# Patient Record
Sex: Female | Born: 1965 | Race: Black or African American | Hispanic: No | Smoking: Former smoker
Health system: Southern US, Community
[De-identification: ages and names within clinical notes are randomized; demographics above are authoritative.]

## PROBLEM LIST (undated history)

## (undated) DIAGNOSIS — G4730 Sleep apnea, unspecified: Secondary | ICD-10-CM

## (undated) DIAGNOSIS — E7800 Pure hypercholesterolemia, unspecified: Secondary | ICD-10-CM

## (undated) DIAGNOSIS — M8580 Other specified disorders of bone density and structure, unspecified site: Secondary | ICD-10-CM

## (undated) DIAGNOSIS — U071 COVID-19: Secondary | ICD-10-CM

## (undated) DIAGNOSIS — I10 Essential (primary) hypertension: Secondary | ICD-10-CM

## (undated) DIAGNOSIS — F419 Anxiety disorder, unspecified: Secondary | ICD-10-CM

## (undated) HISTORY — DX: COVID-19: U07.1

## (undated) HISTORY — DX: Sleep apnea, unspecified: G47.30

## (undated) HISTORY — PX: TONSILLECTOMY: SUR1361

## (undated) HISTORY — DX: Other specified disorders of bone density and structure, unspecified site: M85.80

---

## 1986-12-23 HISTORY — PX: TUBAL LIGATION: SHX77

## 2005-02-12 ENCOUNTER — Ambulatory Visit: Payer: Self-pay | Admitting: Internal Medicine

## 2005-04-02 ENCOUNTER — Ambulatory Visit: Payer: Self-pay | Admitting: Family Medicine

## 2005-04-15 ENCOUNTER — Ambulatory Visit: Payer: Self-pay | Admitting: Family Medicine

## 2006-04-29 ENCOUNTER — Ambulatory Visit: Payer: Self-pay | Admitting: Family Medicine

## 2007-06-23 ENCOUNTER — Ambulatory Visit: Payer: Self-pay | Admitting: Family Medicine

## 2008-06-23 ENCOUNTER — Ambulatory Visit: Payer: Self-pay | Admitting: Family Medicine

## 2008-12-23 HISTORY — PX: ABDOMINAL HYSTERECTOMY: SHX81

## 2009-07-04 ENCOUNTER — Ambulatory Visit: Payer: Self-pay | Admitting: Family Medicine

## 2009-09-28 ENCOUNTER — Ambulatory Visit: Payer: Self-pay | Admitting: Family Medicine

## 2009-10-20 ENCOUNTER — Ambulatory Visit: Payer: Self-pay | Admitting: Obstetrics & Gynecology

## 2009-10-26 ENCOUNTER — Ambulatory Visit: Payer: Self-pay | Admitting: Obstetrics & Gynecology

## 2010-07-18 ENCOUNTER — Ambulatory Visit: Payer: Self-pay | Admitting: Family Medicine

## 2011-01-19 ENCOUNTER — Emergency Department: Payer: Self-pay | Admitting: Emergency Medicine

## 2011-08-06 ENCOUNTER — Ambulatory Visit: Payer: Self-pay | Admitting: Family Medicine

## 2012-08-12 ENCOUNTER — Ambulatory Visit: Payer: Self-pay | Admitting: Family Medicine

## 2013-02-02 ENCOUNTER — Ambulatory Visit: Payer: Self-pay | Admitting: Family Medicine

## 2013-07-29 ENCOUNTER — Encounter: Payer: Self-pay | Admitting: Internal Medicine

## 2013-08-13 ENCOUNTER — Ambulatory Visit: Payer: Self-pay | Admitting: Family Medicine

## 2013-12-23 HISTORY — PX: COLONOSCOPY: SHX174

## 2014-08-23 LAB — HM COLONOSCOPY: HM Colonoscopy: NEGATIVE

## 2014-09-08 ENCOUNTER — Ambulatory Visit: Payer: Self-pay | Admitting: Family Medicine

## 2014-09-09 ENCOUNTER — Ambulatory Visit: Payer: Self-pay | Admitting: Gastroenterology

## 2015-04-26 ENCOUNTER — Encounter: Payer: Self-pay | Admitting: Emergency Medicine

## 2015-04-26 ENCOUNTER — Emergency Department
Admission: EM | Admit: 2015-04-26 | Discharge: 2015-04-27 | Disposition: A | Discharge status: Home / Self Care | Payer: 59 | Attending: Emergency Medicine | Admitting: Emergency Medicine

## 2015-04-26 DIAGNOSIS — N83202 Unspecified ovarian cyst, left side: Secondary | ICD-10-CM

## 2015-04-26 DIAGNOSIS — N8320 Unspecified ovarian cysts: Secondary | ICD-10-CM | POA: Insufficient documentation

## 2015-04-26 DIAGNOSIS — Z87891 Personal history of nicotine dependence: Secondary | ICD-10-CM | POA: Insufficient documentation

## 2015-04-26 DIAGNOSIS — R109 Unspecified abdominal pain: Secondary | ICD-10-CM

## 2015-04-26 HISTORY — DX: Essential (primary) hypertension: I10

## 2015-04-26 HISTORY — DX: Pure hypercholesterolemia, unspecified: E78.00

## 2015-04-26 HISTORY — DX: Anxiety disorder, unspecified: F41.9

## 2015-04-26 LAB — COMPREHENSIVE METABOLIC PANEL
ALBUMIN: 4.3 g/dL (ref 3.5–5.0)
ALK PHOS: 52 U/L (ref 38–126)
ALT: 19 U/L (ref 14–54)
AST: 24 U/L (ref 15–41)
Anion gap: 11 (ref 5–15)
BILIRUBIN TOTAL: 0.7 mg/dL (ref 0.3–1.2)
BUN: 13 mg/dL (ref 6–20)
CO2: 26 mmol/L (ref 22–32)
Calcium: 9.4 mg/dL (ref 8.9–10.3)
Chloride: 101 mmol/L (ref 101–111)
Creatinine, Ser: 0.64 mg/dL (ref 0.44–1.00)
GFR calc Af Amer: >60 mL/min (ref >60)
GFR calc non Af Amer: >60 mL/min (ref >60)
Glucose, Bld: 94 mg/dL (ref 65–99)
Potassium: 3.4 mmol/L — ABNORMAL LOW (ref 3.5–5.1)
SODIUM: 138 mmol/L (ref 135–145)
TOTAL PROTEIN: 8 g/dL (ref 6.5–8.1)

## 2015-04-26 LAB — POCT PREGNANCY, URINE: Preg Test, Ur: NEGATIVE

## 2015-04-26 MED ORDER — MORPHINE SULFATE 4 MG/ML IJ SOLN
4.0000 mg | Freq: Once | INTRAMUSCULAR | Status: AC
  Administered 2015-04-26: 4 mg via INTRAVENOUS

## 2015-04-26 MED ORDER — ONDANSETRON HCL 4 MG/2ML IJ SOLN
4.0000 mg | Freq: Once | INTRAMUSCULAR | Status: AC
  Administered 2015-04-26: 4 mg via INTRAVENOUS

## 2015-04-26 MED ORDER — MORPHINE SULFATE 4 MG/ML IJ SOLN
INTRAMUSCULAR | Status: AC
  Administered 2015-04-26: 4 mg via INTRAVENOUS
  Filled 2015-04-26: qty 1

## 2015-04-26 MED ORDER — SODIUM CHLORIDE 0.9 % IV BOLUS (SEPSIS)
1000.0000 mL | Freq: Once | INTRAVENOUS | Status: AC
  Administered 2015-04-26: 1000 mL via INTRAVENOUS

## 2015-04-26 MED ORDER — IOHEXOL 240 MG/ML SOLN
25.0000 mL | Freq: Once | INTRAMUSCULAR | Status: AC | PRN
  Administered 2015-04-26: 25 mL via ORAL

## 2015-04-26 MED ORDER — ONDANSETRON HCL 4 MG/2ML IJ SOLN
INTRAMUSCULAR | Status: AC
Start: 2015-04-26 — End: 2015-04-26
  Administered 2015-04-26: 4 mg via INTRAVENOUS
  Filled 2015-04-26: qty 2

## 2015-04-26 NOTE — ED Notes (Signed)
PT in with rlq since today, states feel rectal pressure at times.  No dysuria, no vomiting.

## 2015-04-26 NOTE — ED Provider Notes (Signed)
[facility] Emergency Department Provider Note    ____________________________________________  Time seen: 10:00 PM  I have reviewed the triage vital signs and the nursing notes.   HISTORY  Chief Complaint Abdominal Pain       HPI Christine Michael is a 49 y.o. female with a history of hypertension who presents today with right lower abdominal pain that started at 2 PM today. The patient denies any nausea or vomiting. The patient has passed gas today. The patient has not urinated since the pain began so does not know if she is having blood in her urine since the incident started. She denies any vaginal bleeding or discharge, but says that she has had a hysterectomy in the past. She said that she still has her 2 ovaries. Patient does note a history of kidney stones in the family. She states the pain is a 10 out of 10 and sharp at this time. However, throughout the day has been waxing and waning. There is been no radiation of the pain. The patient says she still has her appendix.    Past Medical History  Diagnosis Date  . Hypertension   . Anxiety   . High cholesterol     There are no active problems to display for this patient.   Past Surgical History  Procedure Laterality Date  . Abdominal hysterectomy  2010    partial    Current Outpatient Rx  Name  Route  Sig  Dispense  Refill  . atorvastatin (LIPITOR) 20 MG tablet   Oral   Take 20 mg by mouth daily.         . citalopram (CELEXA) 20 MG tablet   Oral   Take 20 mg by mouth daily.         Marland Kitchen. triamterene-hydrochlorothiazide (MAXZIDE-25) 37.5-25 MG per tablet   Oral   Take 1 tablet by mouth daily.           Allergies Review of patient's allergies indicates no known allergies.  No family history on file.  Social History History  Substance Use Topics  . Smoking status: Former Games developermoker  . Smokeless tobacco: Not on file  . Alcohol Use: No    Review of Systems  Constitutional:  Negative for fever. Eyes: Negative for visual changes. ENT: Negative for sore throat. Cardiovascular: Negative for chest pain. Respiratory: Negative for shortness of breath. Gastrointestinal: Abdominal pain. Negative for vomiting and diarrhea. Genitourinary: Negative for dysuria. Musculoskeletal: Negative for back pain. Skin: Negative for rash. Neurological: Negative for headaches, focal weakness or numbness.   10-point ROS otherwise negative.  ____________________________________________   PHYSICAL EXAM:  VITAL SIGNS: ED Triage Vitals  Enc Vitals Group     BP 04/26/15 2108 140/83 mmHg     Pulse Rate 04/26/15 2108 67     Resp 04/26/15 2154 16     Temp 04/26/15 2108 98.7 F (37.1 C)     Temp Source 04/26/15 2108 Oral     SpO2 04/26/15 2108 100 %     Weight 04/26/15 2108 260 lb (117.935 kg)     Height 04/26/15 2108 5\' 5"  (1.651 m)     Head Cir --      Peak Flow --      Pain Score 04/26/15 2109 10     Pain Loc --      Pain Edu? --      Excl. in GC? --      Constitutional: Alert and oriented. Well appearing and in  no distress. Eyes: Conjunctivae are normal. PERRL. Normal extraocular movements. ENT   Head: Normocephalic and atraumatic.   Nose: No congestion/rhinnorhea.   Mouth/Throat: Mucous membranes are moist.   Neck: No stridor. Hematological/Lymphatic/Immunilogical: No cervical lymphadenopathy. Cardiovascular: Normal rate, regular rhythm. Normal and symmetric distal pulses are present in all extremities. No murmurs, rubs, or gallops. Respiratory: Normal respiratory effort without tachypnea nor retractions. Breath sounds are clear and equal bilaterally. No wheezes/rales/rhonchi. Gastrointestinal: Soft and nontender. No distention. No abdominal bruits. There is no CVA tenderness. Genitourinary: Deferred Musculoskeletal: Nontender with normal range of motion in all extremities. No joint effusions.  No lower extremity tenderness nor edema. Neurologic:   Normal speech and language. No gross focal neurologic deficits are appreciated. Speech is normal. No gait instability. Skin:  Skin is warm, dry and intact. No rash noted. Psychiatric: Mood and affect are normal. Speech and behavior are normal. Patient exhibits appropriate insight and judgment.  ____________________________________________    LABS (pertinent positives/negatives)  Pending CBC. Labs and urine not concerning.  ____________________________________________   EKG    ____________________________________________    RADIOLOGY    ____________________________________________   PROCEDURES    ____________________________________________   INITIAL IMPRESSION / ASSESSMENT AND PLAN / ED COURSE  Pertinent labs & imaging results that were available during my care of the patient were reviewed by me and considered in my medical decision making (see chart for details).  Despite the patient's 10 out of 10 pain the patient has no tenderness on abdominal exam. Because of the right lower quadrant pain the patient will undergo a CAT scan. 2 differential considerations include appendicitis and ureterolithiasis.  ----------------------------------------- 12:23 AM on 04/27/2015 -----------------------------------------  Dr. Scotty CourtStafford to follow-up with CT imaging and disposition accordingly. ____________________________________________   FINAL CLINICAL IMPRESSION(S) / ED DIAGNOSES  Acute abdominal pain to the right lower quadrant. Initial visit.   Arelia Longestavid M Schaevitz, MD 04/27/15 [phone]

## 2015-04-26 NOTE — ED Notes (Signed)
Pt reports RLQ pain since this AM. Pt denies chest pain, denies N/V/D. Pt reports pain as intermittent.

## 2015-04-27 ENCOUNTER — Emergency Department: Payer: 59

## 2015-04-27 LAB — CBC WITH DIFFERENTIAL/PLATELET
BASOS PCT: 1 %
Basophils Absolute: 0 10*3/uL (ref 0–0.1)
Eosinophils Absolute: 0.5 10*3/uL (ref 0–0.7)
Eosinophils Relative: 6 %
HEMATOCRIT: 38.8 % (ref 35.0–47.0)
Hemoglobin: 12.6 g/dL (ref 12.0–16.0)
LYMPHS PCT: 19 %
Lymphs Abs: 1.7 10*3/uL (ref 1.0–3.6)
MCH: 26 pg (ref 26.0–34.0)
MCHC: 32.4 g/dL (ref 32.0–36.0)
MCV: 80.4 fL (ref 80.0–100.0)
Monocytes Absolute: 0.7 10*3/uL (ref 0.2–0.9)
Monocytes Relative: 8 %
Neutro Abs: 5.8 10*3/uL (ref 1.4–6.5)
Neutrophils Relative %: 66 %
Platelets: 188 10*3/uL (ref 150–440)
RBC: 4.83 MIL/uL (ref 3.80–5.20)
RDW: 14.5 % (ref 11.5–14.5)
WBC: 8.8 10*3/uL (ref 3.6–11.0)

## 2015-04-27 LAB — URINALYSIS COMPLETE WITH MICROSCOPIC (ARMC ONLY)
BACTERIA UA: NONE SEEN
BILIRUBIN URINE: NEGATIVE
GLUCOSE, UA: NEGATIVE mg/dL
Hgb urine dipstick: NEGATIVE
KETONES UR: NEGATIVE mg/dL
LEUKOCYTES UA: NEGATIVE
Nitrite: NEGATIVE
PH: 6 (ref 5.0–8.0)
Protein, ur: NEGATIVE mg/dL
Specific Gravity, Urine: 1.011 (ref 1.005–1.030)

## 2015-04-27 MED ORDER — OXYCODONE-ACETAMINOPHEN 5-325 MG PO TABS
ORAL_TABLET | ORAL | Status: AC
  Administered 2015-04-27: 1 via ORAL
  Filled 2015-04-27: qty 1

## 2015-04-27 MED ORDER — OXYCODONE-ACETAMINOPHEN 5-325 MG PO TABS: 1.0000 | ORAL_TABLET | Freq: Four times a day (QID) | ORAL | Status: DC | PRN

## 2015-04-27 MED ORDER — OXYCODONE-ACETAMINOPHEN 5-325 MG PO TABS
1.0000 | ORAL_TABLET | Freq: Once | ORAL | Status: AC
  Administered 2015-04-27: 1 via ORAL

## 2015-04-27 MED ORDER — ONDANSETRON HCL 4 MG PO TABS: 4.0000 mg | ORAL_TABLET | Freq: Four times a day (QID) | ORAL | Status: DC | PRN

## 2015-04-27 MED ORDER — IOHEXOL 300 MG/ML  SOLN
125.0000 mL | Freq: Once | INTRAMUSCULAR | Status: AC | PRN
  Administered 2015-04-27: 125 mL via INTRAVENOUS

## 2015-04-27 MED ORDER — NAPROXEN 500 MG PO TABS: 500.0000 mg | ORAL_TABLET | Freq: Two times a day (BID) | ORAL | Status: DC

## 2015-04-27 NOTE — Discharge Instructions (Signed)
Abdominal Pain, Women °Abdominal (stomach, pelvic, or belly) pain can be caused by many things. It is important to tell your doctor: °· The location of the pain. °· Does it come and go or is it present all the time? °· Are there things that start the pain (eating certain foods, exercise)? °· Are there other symptoms associated with the pain (fever, nausea, vomiting, diarrhea)? °All of this is helpful to know when trying to find the cause of the pain. °CAUSES  °· Stomach: virus or bacteria infection, or ulcer. °· Intestine: appendicitis (inflamed appendix), regional ileitis (Crohn's disease), ulcerative colitis (inflamed colon), irritable bowel syndrome, diverticulitis (inflamed diverticulum of the colon), or cancer of the stomach or intestine. °· Gallbladder disease or stones in the gallbladder. °· Kidney disease, kidney stones, or infection. °· Pancreas infection or cancer. °· Fibromyalgia (pain disorder). °· Diseases of the female organs: °· Uterus: fibroid (non-cancerous) tumors or infection. °· Fallopian tubes: infection or tubal pregnancy. °· Ovary: cysts or tumors. °· Pelvic adhesions (scar tissue). °· Endometriosis (uterus lining tissue growing in the pelvis and on the pelvic organs). °· Pelvic congestion syndrome (female organs filling up with blood just before the menstrual period). °· Pain with the menstrual period. °· Pain with ovulation (producing an egg). °· Pain with an IUD (intrauterine device, birth control) in the uterus. °· Cancer of the female organs. °· Functional pain (pain not caused by a disease, may improve without treatment). °· Psychological pain. °· Depression. °DIAGNOSIS  °Your doctor will decide the seriousness of your pain by doing an examination. °· Blood tests. °· X-rays. °· Ultrasound. °· CT scan (computed tomography, special type of X-ray). °· MRI (magnetic resonance imaging). °· Cultures, for infection. °· Barium enema (dye inserted in the large intestine, to better view it with  X-rays). °· Colonoscopy (looking in intestine with a lighted tube). °· Laparoscopy (minor surgery, looking in abdomen with a lighted tube). °· Major abdominal exploratory surgery (looking in abdomen with a large incision). °TREATMENT  °The treatment will depend on the cause of the pain.  °· Many cases can be observed and treated at home. °· Over-the-counter medicines recommended by your caregiver. °· Prescription medicine. °· Antibiotics, for infection. °· Birth control pills, for painful periods or for ovulation pain. °· Hormone treatment, for endometriosis. °· Nerve blocking injections. °· Physical therapy. °· Antidepressants. °· Counseling with a psychologist or psychiatrist. °· Minor or major surgery. °HOME CARE INSTRUCTIONS  °· Do not take laxatives, unless directed by your caregiver. °· Take over-the-counter pain medicine only if ordered by your caregiver. Do not take aspirin because it can cause an upset stomach or bleeding. °· Try a clear liquid diet (broth or water) as ordered by your caregiver. Slowly move to a bland diet, as tolerated, if the pain is related to the stomach or intestine. °· Have a thermometer and take your temperature several times a day, and record it. °· Bed rest and sleep, if it helps the pain. °· Avoid sexual intercourse, if it causes pain. °· Avoid stressful situations. °· Keep your follow-up appointments and tests, as your caregiver orders. °· If the pain does not go away with medicine or surgery, you may try: °· Acupuncture. °· Relaxation exercises (yoga, meditation). °· Group therapy. °· Counseling. °SEEK MEDICAL CARE IF:  °· You notice certain foods cause stomach pain. °· Your home care treatment is not helping your pain. °· You need stronger pain medicine. °· You want your IUD removed. °· You feel faint or   not go away with medicine or surgery, you may try:   Acupuncture.   Relaxation exercises (yoga, meditation).   Group therapy.   Counseling.  SEEK MEDICAL CARE IF:    You notice certain foods cause stomach pain.   Your home care treatment is not helping your pain.   You need stronger pain medicine.   You want your IUD removed.   You feel faint or lightheaded.   You develop nausea and vomiting.   You develop a rash.   You are having side effects or an allergy to your medicine.  SEEK IMMEDIATE MEDICAL CARE IF:    Your  pain does not go away or gets worse.   You have a fever.   Your pain is felt only in portions of the abdomen. The right side could possibly be appendicitis. The left lower portion of the abdomen could be colitis or diverticulitis.   You are passing blood in your stools (bright red or black tarry stools, with or without vomiting).   You have blood in your urine.   You develop chills, with or without a fever.   You pass out.  MAKE SURE YOU:    Understand these instructions.   Will watch your condition.   Will get help right away if you are not doing well or get worse.  Document Released: 10/06/2007 Document Revised: 04/25/2014 Document Reviewed: 10/26/2009  Exit[employer] Patient Information 2015 [employer]. This information is not intended to replace advice given to you by your health care provider. Make sure you discuss any questions you have with your health care provider.          Ovarian Cyst  An ovarian cyst is a fluid-filled sac that forms on an ovary. The ovaries are small organs that produce eggs in women. Various types of cysts can form on the ovaries. Most are not cancerous. Many do not cause problems, and they often go away on their own. Some may cause symptoms and require treatment. Common types of ovarian cysts include:   Functional cysts--These cysts may occur every month during the menstrual cycle. This is normal. The cysts usually go away with the next menstrual cycle if the woman does not get pregnant. Usually, there are no symptoms with a functional cyst.   Endometrioma cysts--These cysts form from the tissue that lines the uterus. They are also called "chocolate cysts" because they become filled with blood that turns brown. This type of cyst can cause pain in the lower abdomen during intercourse and with your menstrual period.   Cystadenoma cysts--This type develops from the cells on the outside of the ovary. These cysts can get very big and cause lower abdomen pain and pain with  intercourse. This type of cyst can twist on itself, cut off its blood supply, and cause severe pain. It can also easily rupture and cause a lot of pain.   Dermoid cysts--This type of cyst is sometimes found in both ovaries. These cysts may contain different kinds of body tissue, such as skin, teeth, hair, or cartilage. They usually do not cause symptoms unless they get very big.   Theca lutein cysts--These cysts occur when too much of a certain hormone (human chorionic gonadotropin) is produced and overstimulates the ovaries to produce an egg. This is most common after procedures used to assist with the conception of a baby (in vitro fertilization).  CAUSES    Fertility drugs can cause a condition in which multiple large cysts are formed on the   more about the cyst. These tests may include:  Ultrasound.  X-ray of the pelvis.  CT scan.  MRI.  Blood tests. TREATMENT  Many ovarian cysts go away on their own without treatment. Your health care provider may want to check your cyst regularly for 2-3 months to see if it changes. For women in menopause, it is particularly important to monitor a cyst closely because of the higher rate of ovarian cancer in menopausal women. When treatment is needed, it may include any of the following:  A procedure to  drain the cyst (aspiration). This may be done using a long needle and ultrasound. It can also be done through a laparoscopic procedure. This involves using a thin, lighted tube with a tiny camera on the end (laparoscope) inserted through a small incision.  Surgery to remove the whole cyst. This may be done using laparoscopic surgery or an open surgery involving a larger incision in the lower abdomen.  Hormone treatment or birth control pills. These methods are sometimes used to help dissolve a cyst. HOME CARE INSTRUCTIONS   Only take over-the-counter or prescription medicines as directed by your health care provider.  Follow up with your health care provider as directed.  Get regular pelvic exams and Pap tests. SEEK MEDICAL CARE IF:   Your periods are late, irregular, or painful, or they stop.  Your pelvic pain or abdominal pain does not go away.  Your abdomen becomes larger or swollen.  You have pressure on your bladder or trouble emptying your bladder completely.  You have pain during sexual intercourse.  You have feelings of fullness, pressure, or discomfort in your stomach.  You lose weight for no apparent reason.  You feel generally ill.  You become constipated.  You lose your appetite.  You develop acne.  You have an increase in body and facial hair.  You are gaining weight, without changing your exercise and eating habits.  You think you are pregnant. SEEK IMMEDIATE MEDICAL CARE IF:   You have increasing abdominal pain.  You feel sick to your stomach (nauseous), and you throw up (vomit).  You develop a fever that comes on suddenly.  You have abdominal pain during a bowel movement.  Your menstrual periods become heavier than usual. MAKE SURE YOU:  Understand these instructions.  Will watch your condition.  Will get help right away if you are not doing well or get worse. Document Released: 12/09/2005 Document Revised: 12/14/2013 Document Reviewed:  08/16/2013 [facility] Patient Information 2015 [place], [place]. This information is not intended to replace advice given to you by your health care provider. Make sure you discuss any questions you have with your health care provider.    You were prescribed medications that are potentially sedating.  Please avoid operating heavy or dangerous machinery, including driving a car, while taking this medication.

## 2015-04-27 NOTE — ED Provider Notes (Signed)
-----------------------------------------   1:34 AM on 04/27/2015 -----------------------------------------  I assumed care of the patient at 11 PM with the CT scan of the abdomen and pelvis and some labs pending. The remainder of the workup has been completed, and the CT scan reveals a left ovarian cyst, which is likely hemorrhagic. Symptoms are controlled at this time and the patient will be discharged with prescriptions for Naprosyn, Percocet, and Zofran. She is medically stable. She is to follow-up with her doctor this week.  Results for orders placed or performed during the hospital encounter of 04/26/15  Comprehensive metabolic panel  Result Value Ref Range   Sodium 138 135 - 145 mmol/L   Potassium 3.4 (L) 3.5 - 5.1 mmol/L   Chloride 101 101 - 111 mmol/L   CO2 26 22 - 32 mmol/L   Glucose, Bld 94 65 - 99 mg/dL   BUN 13 6 - 20 mg/dL   Creatinine, Ser 1.020.64 0.44 - 1.00 mg/dL   Calcium 9.4 8.9 - 72.510.3 mg/dL   Total Protein 8.0 6.5 - 8.1 g/dL   Albumin 4.3 3.5 - 5.0 g/dL   AST 24 15 - 41 U/L   ALT 19 14 - 54 U/L   Alkaline Phosphatase 52 38 - 126 U/L   Total Bilirubin 0.7 0.3 - 1.2 mg/dL   GFR calc non Af Amer >60 >60 mL/min   GFR calc Af Amer >60 >60 mL/min   Anion gap 11 5 - 15  Urinalysis complete, with microscopic [facility])  Result Value Ref Range   Color, Urine YELLOW (A) YELLOW   APPearance CLEAR (A) CLEAR   Glucose, UA NEGATIVE NEGATIVE mg/dL   Bilirubin Urine NEGATIVE NEGATIVE   Ketones, ur NEGATIVE NEGATIVE mg/dL   Specific Gravity, Urine 1.011 1.005 - 1.030   Hgb urine dipstick NEGATIVE NEGATIVE   pH 6.0 5.0 - 8.0   Protein, ur NEGATIVE NEGATIVE mg/dL   Nitrite NEGATIVE NEGATIVE   Leukocytes, UA NEGATIVE NEGATIVE   RBC / HPF 0-5 0 - 5 RBC/hpf   WBC, UA 0-5 0 - 5 WBC/hpf   Bacteria, UA NONE SEEN NONE SEEN   Squamous Epithelial / LPF 0-5 (A) NONE SEEN   Mucous PRESENT   CBC with Differential  Result Value Ref Range   WBC 8.8 3.6 - 11.0 K/uL   RBC 4.83 3.80 - 5.20  MIL/uL   Hemoglobin 12.6 12.0 - 16.0 g/dL   HCT 36.638.8 44.035.0 - 34.747.0 %   MCV 80.4 80.0 - 100.0 fL   MCH 26.0 26.0 - 34.0 pg   MCHC 32.4 32.0 - 36.0 g/dL   RDW 42.514.5 95.611.5 - 38.714.5 %   Platelets 188 150 - 440 K/uL   Neutrophils Relative % 66 %   Neutro Abs 5.8 1.4 - 6.5 K/uL   Lymphocytes Relative 19 %   Lymphs Abs 1.7 1.0 - 3.6 K/uL   Monocytes Relative 8 %   Monocytes Absolute 0.7 0.2 - 0.9 K/uL   Eosinophils Relative 6 %   Eosinophils Absolute 0.5 0 - 0.7 K/uL   Basophils Relative 1 %   Basophils Absolute 0.0 0 - 0.1 K/uL  Pregnancy, urine POC  Result Value Ref Range   Preg Test, Ur NEGATIVE NEGATIVE     Sharman CheekPhillip Sorina Derrig, MD 04/27/15 [phone]

## 2015-04-29 ENCOUNTER — Emergency Department
Admission: EM | Admit: 2015-04-29 | Discharge: 2015-04-29 | Discharge status: Against Medical Advice | Payer: 59 | Attending: Emergency Medicine | Admitting: Emergency Medicine

## 2015-04-29 ENCOUNTER — Encounter: Payer: Self-pay | Admitting: Emergency Medicine

## 2015-04-29 DIAGNOSIS — R1031 Right lower quadrant pain: Secondary | ICD-10-CM | POA: Diagnosis not present

## 2015-04-29 DIAGNOSIS — I10 Essential (primary) hypertension: Secondary | ICD-10-CM | POA: Diagnosis not present

## 2015-04-29 DIAGNOSIS — N8320 Unspecified ovarian cysts: Secondary | ICD-10-CM | POA: Diagnosis not present

## 2015-04-29 DIAGNOSIS — K5900 Constipation, unspecified: Secondary | ICD-10-CM | POA: Diagnosis not present

## 2015-04-29 LAB — URINALYSIS COMPLETE WITH MICROSCOPIC (ARMC ONLY)
BACTERIA UA: NONE SEEN
BILIRUBIN URINE: NEGATIVE
GLUCOSE, UA: NEGATIVE mg/dL
Hgb urine dipstick: NEGATIVE
Ketones, ur: NEGATIVE mg/dL
LEUKOCYTES UA: NEGATIVE
Nitrite: NEGATIVE
Protein, ur: NEGATIVE mg/dL
SPECIFIC GRAVITY, URINE: 1.016 (ref 1.005–1.030)
pH: 7 (ref 5.0–8.0)

## 2015-04-29 LAB — CBC WITH DIFFERENTIAL/PLATELET
BASOS PCT: 1 %
Basophils Absolute: 0 10*3/uL (ref 0–0.1)
Eosinophils Absolute: 0.5 10*3/uL (ref 0–0.7)
Eosinophils Relative: 7 %
HCT: 38.6 % (ref 35.0–47.0)
Hemoglobin: 12.7 g/dL (ref 12.0–16.0)
Lymphocytes Relative: 28 %
Lymphs Abs: 1.8 10*3/uL (ref 1.0–3.6)
MCH: 26.2 pg (ref 26.0–34.0)
MCHC: 32.8 g/dL (ref 32.0–36.0)
MCV: 80 fL (ref 80.0–100.0)
MONO ABS: 0.5 10*3/uL (ref 0.2–0.9)
Monocytes Relative: 8 %
Neutro Abs: 3.5 10*3/uL (ref 1.4–6.5)
Neutrophils Relative %: 56 %
Platelets: 197 10*3/uL (ref 150–440)
RBC: 4.83 MIL/uL (ref 3.80–5.20)
RDW: 14.3 % (ref 11.5–14.5)
WBC: 6.3 10*3/uL (ref 3.6–11.0)

## 2015-04-29 LAB — BASIC METABOLIC PANEL
Anion gap: 7 (ref 5–15)
BUN: 10 mg/dL (ref 6–20)
CO2: 28 mmol/L (ref 22–32)
Calcium: 9 mg/dL (ref 8.9–10.3)
Chloride: 102 mmol/L (ref 101–111)
Creatinine, Ser: 0.67 mg/dL (ref 0.44–1.00)
Glucose, Bld: 93 mg/dL (ref 65–99)
Potassium: 3.5 mmol/L (ref 3.5–5.1)
Sodium: 137 mmol/L (ref 135–145)

## 2015-04-29 NOTE — ED Notes (Signed)
Patient presents to the ED with right lower abdominal/pelvic pain intermittent since Wednesday.  Patient states she was seen in ED on Wednesday and was told she had a cyst on her ovary.  Patient reports having constipation and bloating since Wednesday and no BM since Wednesday.  Patient reports pain subsided but returned this morning.  Patient states pain is very sharp.  Patient denies vomiting.  Patient denies chest pain and shortness of breath.

## 2015-05-21 ENCOUNTER — Other Ambulatory Visit: Payer: Self-pay

## 2015-05-21 ENCOUNTER — Emergency Department
Admission: EM | Admit: 2015-05-21 | Discharge: 2015-05-22 | Disposition: A | Discharge status: Home / Self Care | Payer: 59 | Attending: Emergency Medicine | Admitting: Emergency Medicine

## 2015-05-21 DIAGNOSIS — Z87891 Personal history of nicotine dependence: Secondary | ICD-10-CM | POA: Insufficient documentation

## 2015-05-21 DIAGNOSIS — N83202 Unspecified ovarian cyst, left side: Secondary | ICD-10-CM

## 2015-05-21 DIAGNOSIS — K529 Noninfective gastroenteritis and colitis, unspecified: Secondary | ICD-10-CM | POA: Insufficient documentation

## 2015-05-21 DIAGNOSIS — N8320 Unspecified ovarian cysts: Secondary | ICD-10-CM | POA: Insufficient documentation

## 2015-05-21 DIAGNOSIS — R109 Unspecified abdominal pain: Secondary | ICD-10-CM | POA: Diagnosis present

## 2015-05-21 DIAGNOSIS — Z79899 Other long term (current) drug therapy: Secondary | ICD-10-CM | POA: Diagnosis not present

## 2015-05-21 DIAGNOSIS — R1032 Left lower quadrant pain: Secondary | ICD-10-CM

## 2015-05-21 DIAGNOSIS — I10 Essential (primary) hypertension: Secondary | ICD-10-CM | POA: Diagnosis not present

## 2015-05-21 DIAGNOSIS — R079 Chest pain, unspecified: Secondary | ICD-10-CM | POA: Insufficient documentation

## 2015-05-21 LAB — COMPREHENSIVE METABOLIC PANEL
ALBUMIN: 3.9 g/dL (ref 3.5–5.0)
ALK PHOS: 50 U/L (ref 38–126)
ALT: 16 U/L (ref 14–54)
AST: 21 U/L (ref 15–41)
Anion gap: 11 (ref 5–15)
BUN: 18 mg/dL (ref 6–20)
CO2: 25 mmol/L (ref 22–32)
Calcium: 9.2 mg/dL (ref 8.9–10.3)
Chloride: 103 mmol/L (ref 101–111)
Creatinine, Ser: 0.78 mg/dL (ref 0.44–1.00)
Glucose, Bld: 166 mg/dL — ABNORMAL HIGH (ref 65–99)
POTASSIUM: 3.1 mmol/L — AB (ref 3.5–5.1)
SODIUM: 139 mmol/L (ref 135–145)
Total Bilirubin: 0.4 mg/dL (ref 0.3–1.2)
Total Protein: 7.5 g/dL (ref 6.5–8.1)

## 2015-05-21 LAB — CBC WITH DIFFERENTIAL/PLATELET
BASOS PCT: 1 %
Basophils Absolute: 0.1 10*3/uL (ref 0–0.1)
Eosinophils Absolute: 0.5 10*3/uL (ref 0–0.7)
Eosinophils Relative: 2 %
HCT: 37.4 % (ref 35.0–47.0)
Hemoglobin: 12.1 g/dL (ref 12.0–16.0)
LYMPHS PCT: 12 %
Lymphs Abs: 2.4 10*3/uL (ref 1.0–3.6)
MCH: 25.7 pg — ABNORMAL LOW (ref 26.0–34.0)
MCHC: 32.4 g/dL (ref 32.0–36.0)
MCV: 79.5 fL — ABNORMAL LOW (ref 80.0–100.0)
Monocytes Absolute: 1.1 10*3/uL — ABNORMAL HIGH (ref 0.2–0.9)
Monocytes Relative: 5 %
Neutro Abs: 16.2 10*3/uL — ABNORMAL HIGH (ref 1.4–6.5)
Neutrophils Relative %: 80 %
PLATELETS: 209 10*3/uL (ref 150–440)
RBC: 4.71 MIL/uL (ref 3.80–5.20)
RDW: 14.3 % (ref 11.5–14.5)
WBC: 20.3 10*3/uL — ABNORMAL HIGH (ref 3.6–11.0)

## 2015-05-21 LAB — LIPASE, BLOOD: Lipase: 32 U/L (ref 22–51)

## 2015-05-21 MED ORDER — ONDANSETRON 8 MG PO TBDP
ORAL_TABLET | ORAL | Status: AC
  Administered 2015-05-21: 8 mg via ORAL
  Filled 2015-05-21: qty 1

## 2015-05-21 MED ORDER — ONDANSETRON 8 MG PO TBDP
8.0000 mg | ORAL_TABLET | Freq: Once | ORAL | Status: AC
  Administered 2015-05-21: 8 mg via ORAL

## 2015-05-21 MED ORDER — MORPHINE SULFATE 4 MG/ML IJ SOLN
4.0000 mg | Freq: Once | INTRAMUSCULAR | Status: AC
  Administered 2015-05-22: 4 mg via INTRAVENOUS

## 2015-05-21 MED ORDER — SODIUM CHLORIDE 0.9 % IV BOLUS (SEPSIS)
500.0000 mL | Freq: Once | INTRAVENOUS | Status: AC
  Administered 2015-05-22: 500 mL via INTRAVENOUS

## 2015-05-21 MED ORDER — ONDANSETRON HCL 4 MG/2ML IJ SOLN
4.0000 mg | Freq: Once | INTRAMUSCULAR | Status: AC
  Administered 2015-05-22: 4 mg via INTRAVENOUS

## 2015-05-21 NOTE — ED Notes (Signed)
Pt presents to ER alert and in NAD. Pt states stomach pain since 4pm. Denies n/v. Reports she has hx of ovarian cysts. Pt also c/o chest pain.

## 2015-05-21 NOTE — ED Provider Notes (Signed)
[facility] Emergency Department Provider Note  ____________________________________________  Time seen: Approximately 11:56 PM  I have reviewed the triage vital signs and the nursing notes.   HISTORY  Chief Complaint Abdominal Pain and Chest Pain    HPI Christine Michael is a 49 y.o. female who presents with abdominal pain since 4 PM. Patient describes 10/10 gradual onset, nonradiating pain mainly in left lower quadrant and also peri-umbilical. Patient reports eating steak at approximately 3 PM and felt fine until her pain began. Endorses nausea, denies vomiting. Also states her chest was hurting when her pain was at the maximum. Denies diarrhea; had a normal bowel movement approximately 9 PM. States she felt pressure in her rectum. Denies recent foreign travel or antibiotics. Denies fever, chills, shortness of breath, headache, weakness, numbness, tingling.   Past Medical History  Diagnosis Date  . Hypertension   . Anxiety   . High cholesterol    Ovarian cyst  There are no active problems to display for this patient.   Past Surgical History  Procedure Laterality Date  . Abdominal hysterectomy  2010    partial    Current Outpatient Rx  Name  Route  Sig  Dispense  Refill  . atorvastatin (LIPITOR) 20 MG tablet   Oral   Take 20 mg by mouth daily.         . ciprofloxacin (CIPRO) 500 MG tablet   Oral   Take 1 tablet (500 mg total) by mouth 2 (two) times daily.   14 tablet   0   . citalopram (CELEXA) 20 MG tablet   Oral   Take 20 mg by mouth daily.         . metroNIDAZOLE (FLAGYL) 500 MG tablet   Oral   Take 1 tablet (500 mg total) by mouth 2 (two) times daily.   14 tablet   0   . naproxen (NAPROSYN) 500 MG tablet   Oral   Take 1 tablet (500 mg total) by mouth 2 (two) times daily with a meal.   20 tablet   0   . ondansetron (ZOFRAN) 4 MG tablet   Oral   Take 1 tablet (4 mg total) by mouth every 6 (six) hours as needed for nausea or  vomiting.   20 tablet   1   . ondansetron (ZOFRAN) 4 MG tablet   Oral   Take 1 tablet (4 mg total) by mouth every 8 (eight) hours as needed for nausea or vomiting.   20 tablet   1   . oxyCODONE-acetaminophen (ROXICET) 5-325 MG per tablet   Oral   Take 1 tablet by mouth every 6 (six) hours as needed for severe pain.   12 tablet   0   . oxyCODONE-acetaminophen (ROXICET) 5-325 MG per tablet   Oral   Take 1 tablet by mouth every 4 (four) hours as needed for severe pain.   20 tablet   0   . triamterene-hydrochlorothiazide (MAXZIDE-25) 37.5-25 MG per tablet   Oral   Take 1 tablet by mouth daily.           Allergies Review of patient's allergies indicates no known allergies.  Family History  Problem Relation Age of Onset  . Cancer Mother   . Alcohol abuse Father     Social History History  Substance Use Topics  . Smoking status: Former Games developermoker  . Smokeless tobacco: Not on file  . Alcohol Use: No    Review of Systems Constitutional: No  fever/chills Eyes: No visual changes. ENT: No sore throat. Cardiovascular: Positive for chest pain. Respiratory: Denies shortness of breath. Gastrointestinal: Positive for abdominal pain. Positive for nausea. Negative for vomiting.  No diarrhea.  No constipation. Genitourinary: Negative for dysuria. Musculoskeletal: Negative for back pain. Skin: Negative for rash. Neurological: Negative for headaches, focal weakness or numbness.  10-point ROS otherwise negative.  ____________________________________________   PHYSICAL EXAM:  VITAL SIGNS: ED Triage Vitals  Enc Vitals Group     BP 05/21/15 2142 110/62 mmHg     Pulse Rate 05/21/15 2142 87     Resp 05/21/15 2142 24     Temp 05/21/15 2142 98.2 F (36.8 C)     Temp Source 05/21/15 2142 Oral     SpO2 05/21/15 2142 99 %     Weight 05/21/15 2142 274 lb (124.286 kg)     Height 05/21/15 2142  (1.676 m)     Head Cir --      Peak Flow --      Pain Score 05/21/15 2143 10      Pain Loc --      Pain Edu? --      Excl. in GC? --     Constitutional: Alert and oriented. Well appearing and in moderate acute distress. Eyes: Conjunctivae are normal. PERRL. EOMI. Head: Atraumatic. Nose: No congestion/rhinnorhea. Mouth/Throat: Mucous membranes are moist.  Oropharynx non-erythematous. Neck: No stridor.   Cardiovascular: Normal rate, regular rhythm. Grossly normal heart sounds.  Good peripheral circulation. Respiratory: Normal respiratory effort.  No retractions. Lungs CTAB. Gastrointestinal: Obese, soft, tender to palpation left upper and left lower quadrant without rebound or guarding. Pain maximally and left lower quadrant. No distention. No abdominal bruits. No CVA tenderness. Rectal: No hemorrhoid or prolapse noted. Musculoskeletal: No lower extremity tenderness nor edema.  No joint effusions. Neurologic:  Normal speech and language. No gross focal neurologic deficits are appreciated. Speech is normal. No gait instability. Skin:  Skin is warm, dry and intact. No rash noted. Psychiatric: Mood and affect are normal. Speech and behavior are normal.  ____________________________________________   LABS (all labs ordered are listed, but only abnormal results are displayed)  Labs Reviewed  CBC WITH DIFFERENTIAL/PLATELET - Abnormal; Notable for the following:    WBC 20.3 (*)    MCV 79.5 (*)    MCH 25.7 (*)    Neutro Abs 16.2 (*)    Monocytes Absolute 1.1 (*)    All other components within normal limits  COMPREHENSIVE METABOLIC PANEL - Abnormal; Notable for the following:    Potassium 3.1 (*)    Glucose, Bld 166 (*)    All other components within normal limits  LIPASE, BLOOD   ____________________________________________  EKG  ED ECG REPORT I, Elgin Carn J, the attending physician, personally viewed and interpreted this ECG.   Date: 05/22/2015  EKG Time: 2154  Rate: 82  Rhythm: normal EKG, normal sinus rhythm  Axis: Normal  Intervals:none  ST&T  Change: Nonspecific  ____________________________________________  RADIOLOGY  CT abdomen/pelvis interpreted per Dr. Cherly Hensen: 1. 7.7 x 6.5 cm cystic mass at the left hemipelvis. This has increased in size from the recent prior CT, with layering increased attenuation. This most likely reflects an enlarging hemorrhagic cyst, though a cystic ovarian tumor could have a similar appearance. Depending on the patient's symptoms, MRI could be considered for further evaluation. 2. Vague wall thickening and soft tissue inflammation involving multiple loops of small bowel in the lower abdomen and upper pelvis. This is concerning for an  acute infectious or inflammatory ileitis.  ____________________________________________   PROCEDURES  Procedure(s) performed: None  Critical Care performed: No  ____________________________________________   INITIAL IMPRESSION / ASSESSMENT AND PLAN / ED COURSE  Pertinent labs & imaging results that were available during my care of the patient were reviewed by me and considered in my medical decision making (see chart for details).  49 year old female presenting with left-sided abdominal pain and leukocytosis; exam worrisome for acute diverticulitis. Patient states this pain is completely different and on the opposite side from when she presented earlier this month and had a CT scan. Discussed with patient and her family; will initiate IV analgesia and repeat CT scan to evaluate etiology of abdominal pain and leukocytosis.  ----------------------------------------- 2:07 AM on 05/22/2015 -----------------------------------------  Patient improved; rates 4/10 pain currently. Discussed with patient and mother results of CT scan. Patient has upcoming gynecology appointment with [facility]. Will start patient on Cipro and Flagyl; prescriptions for analgesia and antiemetic. Strict return precautions given. Both verbalize understanding and agree with plan of  care. ____________________________________________   FINAL CLINICAL IMPRESSION(S) / ED DIAGNOSES  Final diagnoses:  Cyst of left ovary  Left lower quadrant pain  Ileitis      Irean Hong, MD 05/22/15 [id number]

## 2015-05-22 ENCOUNTER — Encounter: Payer: Self-pay | Admitting: Radiology

## 2015-05-22 ENCOUNTER — Emergency Department: Payer: 59

## 2015-05-22 MED ORDER — ONDANSETRON HCL 4 MG PO TABS
ORAL_TABLET | ORAL | Status: AC
  Filled 2015-05-22: qty 1

## 2015-05-22 MED ORDER — METRONIDAZOLE 500 MG PO TABS: 500.0000 mg | ORAL_TABLET | Freq: Two times a day (BID) | ORAL | Status: DC

## 2015-05-22 MED ORDER — CIPROFLOXACIN HCL 500 MG PO TABS
500.0000 mg | ORAL_TABLET | Freq: Once | ORAL | Status: AC
Start: 2015-05-22 — End: 2015-05-22
  Administered 2015-05-22: 500 mg via ORAL

## 2015-05-22 MED ORDER — ONDANSETRON HCL 4 MG/2ML IJ SOLN
INTRAMUSCULAR | Status: AC
  Filled 2015-05-22: qty 2

## 2015-05-22 MED ORDER — CIPROFLOXACIN HCL 500 MG PO TABS
ORAL_TABLET | ORAL | Status: AC
  Administered 2015-05-22: 500 mg via ORAL
  Filled 2015-05-22: qty 1

## 2015-05-22 MED ORDER — OXYCODONE-ACETAMINOPHEN 5-325 MG PO TABS
1.0000 | ORAL_TABLET | Freq: Once | ORAL | Status: AC
  Administered 2015-05-22: 1 via ORAL

## 2015-05-22 MED ORDER — METRONIDAZOLE 500 MG PO TABS
ORAL_TABLET | ORAL | Status: AC
  Administered 2015-05-22: 500 mg via ORAL
  Filled 2015-05-22: qty 1

## 2015-05-22 MED ORDER — METRONIDAZOLE 500 MG PO TABS
500.0000 mg | ORAL_TABLET | Freq: Once | ORAL | Status: AC
  Administered 2015-05-22: 500 mg via ORAL

## 2015-05-22 MED ORDER — OXYCODONE-ACETAMINOPHEN 5-325 MG PO TABS
ORAL_TABLET | ORAL | Status: AC
  Administered 2015-05-22: 1 via ORAL
  Filled 2015-05-22: qty 1

## 2015-05-22 MED ORDER — ONDANSETRON 4 MG PO TBDP
ORAL_TABLET | ORAL | Status: AC
  Administered 2015-05-22: 4 mg via ORAL
  Filled 2015-05-22: qty 1

## 2015-05-22 MED ORDER — CIPROFLOXACIN HCL 500 MG PO TABS: 500.0000 mg | ORAL_TABLET | Freq: Two times a day (BID) | ORAL | Status: DC

## 2015-05-22 MED ORDER — MORPHINE SULFATE 4 MG/ML IJ SOLN
4.0000 mg | Freq: Once | INTRAMUSCULAR | Status: AC
  Administered 2015-05-22: 4 mg via INTRAVENOUS

## 2015-05-22 MED ORDER — OXYCODONE-ACETAMINOPHEN 5-325 MG PO TABS: 1.0000 | ORAL_TABLET | ORAL | Status: DC | PRN

## 2015-05-22 MED ORDER — IOHEXOL 300 MG/ML  SOLN
125.0000 mL | Freq: Once | INTRAMUSCULAR | Status: AC | PRN
  Administered 2015-05-22: 125 mL via INTRAVENOUS

## 2015-05-22 MED ORDER — MORPHINE SULFATE 4 MG/ML IJ SOLN
INTRAMUSCULAR | Status: AC
  Filled 2015-05-22: qty 1

## 2015-05-22 MED ORDER — IOHEXOL 240 MG/ML SOLN
25.0000 mL | Freq: Once | INTRAMUSCULAR | Status: AC | PRN
  Administered 2015-05-22: 25 mL via ORAL

## 2015-05-22 MED ORDER — ONDANSETRON HCL 4 MG PO TABS: 4.0000 mg | ORAL_TABLET | Freq: Three times a day (TID) | ORAL | Status: DC | PRN

## 2015-05-22 MED ORDER — ONDANSETRON 4 MG PO TBDP
4.0000 mg | ORAL_TABLET | Freq: Once | ORAL | Status: AC
Start: 2015-05-22 — End: 2015-05-22
  Administered 2015-05-22: 4 mg via ORAL

## 2015-05-22 NOTE — Discharge Instructions (Signed)
1. Take antibiotics as prescribed (Cipro/Flagyl 500 mg twice daily 7 days). 2. Take medicines as needed for pain and nausea (Percocet/Zofran #20). 3. Eat a bland diet x one week. 4. Return to the ER for worsening symptoms, persistent vomiting, fever, breathing difficulty or other concerns.  Abdominal Pain, Women Abdominal (stomach, pelvic, or belly) pain can be caused by many things. It is important to tell your doctor:  The location of the pain.  Does it come and go or is it present all the time?  Are there things that start the pain (eating certain foods, exercise)?  Are there other symptoms associated with the pain (fever, nausea, vomiting, diarrhea)? All of this is helpful to know when trying to find the cause of the pain. CAUSES   Stomach: virus or bacteria infection, or ulcer.  Intestine: appendicitis (inflamed appendix), regional ileitis (Crohn's disease), ulcerative colitis (inflamed colon), irritable bowel syndrome, diverticulitis (inflamed diverticulum of the colon), or cancer of the stomach or intestine.  Gallbladder disease or stones in the gallbladder.  Kidney disease, kidney stones, or infection.  Pancreas infection or cancer.  Fibromyalgia (pain disorder).  Diseases of the female organs:  Uterus: fibroid (non-cancerous) tumors or infection.  Fallopian tubes: infection or tubal pregnancy.  Ovary: cysts or tumors.  Pelvic adhesions (scar tissue).  Endometriosis (uterus lining tissue growing in the pelvis and on the pelvic organs).  Pelvic congestion syndrome (female organs filling up with blood just before the menstrual period).  Pain with the menstrual period.  Pain with ovulation (producing an egg).  Pain with an IUD (intrauterine device, birth control) in the uterus.  Cancer of the female organs.  Functional pain (pain not caused by a disease, may improve without treatment).  Psychological pain.  Depression. DIAGNOSIS  Your doctor will decide  the seriousness of your pain by doing an examination.  Blood tests.  X-rays.  Ultrasound.  CT scan (computed tomography, special type of X-ray).  MRI (magnetic resonance imaging).  Cultures, for infection.  Barium enema (dye inserted in the large intestine, to better view it with X-rays).  Colonoscopy (looking in intestine with a lighted tube).  Laparoscopy (minor surgery, looking in abdomen with a lighted tube).  Major abdominal exploratory surgery (looking in abdomen with a large incision). TREATMENT  The treatment will depend on the cause of the pain.   Many cases can be observed and treated at home.  Over-the-counter medicines recommended by your caregiver.  Prescription medicine.  Antibiotics, for infection.  Birth control pills, for painful periods or for ovulation pain.  Hormone treatment, for endometriosis.  Nerve blocking injections.  Physical therapy.  Antidepressants.  Counseling with a psychologist or psychiatrist.  Minor or major surgery. HOME CARE INSTRUCTIONS   Do not take laxatives, unless directed by your caregiver.  Take over-the-counter pain medicine only if ordered by your caregiver. Do not take aspirin because it can cause an upset stomach or bleeding.  Try a clear liquid diet (broth or water) as ordered by your caregiver. Slowly move to a bland diet, as tolerated, if the pain is related to the stomach or intestine.  Have a thermometer and take your temperature several times a day, and record it.  Bed rest and sleep, if it helps the pain.  Avoid sexual intercourse, if it causes pain.  Avoid stressful situations.  Keep your follow-up appointments and tests, as your caregiver orders.  If the pain does not go away with medicine or surgery, you may try:  Acupuncture.  Relaxation  exercises (yoga, meditation).  Group therapy.  Counseling. SEEK MEDICAL CARE IF:   You notice certain foods cause stomach pain.  Your home care  treatment is not helping your pain.  You need stronger pain medicine.  You want your IUD removed.  You feel faint or lightheaded.  You develop nausea and vomiting.  You develop a rash.  You are having side effects or an allergy to your medicine. SEEK IMMEDIATE MEDICAL CARE IF:   Your pain does not go away or gets worse.  You have a fever.  Your pain is felt only in portions of the abdomen. The right side could possibly be appendicitis. The left lower portion of the abdomen could be colitis or diverticulitis.  You are passing blood in your stools (bright red or black tarry stools, with or without vomiting).  You have blood in your urine.  You develop chills, with or without a fever.  You pass out. MAKE SURE YOU:   Understand these instructions.  Will watch your condition.  Will get help right away if you are not doing well or get worse. Document Released: 10/06/2007 Document Revised: 04/25/2014 Document Reviewed: 10/26/2009 [facility] Patient Information 2015 [place], [place]. This information is not intended to replace advice given to you by your health care provider. Make sure you discuss any questions you have with your health care provider.  Ovarian Cyst An ovarian cyst is a sac filled with fluid or blood. This sac is attached to the ovary. Some cysts go away on their own. Other cysts need treatment.  HOME CARE   Only take medicine as told by your doctor.  Follow up with your doctor as told.  Get regular pelvic exams and Pap tests. GET HELP IF:  Your periods are late, not regular, or painful.  You stop having periods.  Your belly (abdominal) or pelvic pain does not go away.  Your belly becomes large or puffy (swollen).  You have a hard time peeing (totally emptying your bladder).  You have pressure on your bladder.  You have pain during sex.  You feel fullness, pressure, or discomfort in your belly.  You lose weight for no reason.  You feel sick most  of the time.  You have a hard time pooping (constipation).  You do not feel like eating.  You develop pimples (acne).  You have an increase in hair on your body and face.  You are gaining weight for no reason.  You think you are pregnant. GET HELP RIGHT AWAY IF:   Your belly pain gets worse.  You feel sick to your stomach (nauseous), and you throw up (vomit).  You have a fever that comes on fast.  You have belly pain while pooping (bowel movement).  Your periods are heavier than usual. MAKE SURE YOU:   Understand these instructions.  Will watch your condition.  Will get help right away if you are not doing well or get worse. Document Released: 05/27/2008 Document Revised: 09/29/2013 Document Reviewed: 08/16/2013 [facility] Patient Information 2015 [place], [place]. This information is not intended to replace advice given to you by your health care provider. Make sure you discuss any questions you have with your health care provider.  Colitis Colitis is inflammation of the colon. Colitis can be a short-term or long-standing (chronic) illness. Crohn's disease and ulcerative colitis are 2 types of colitis which are chronic. They usually require lifelong treatment. CAUSES  There are many different causes of colitis, including:  Viruses.  Germs (bacteria).  Medicine reactions. SYMPTOMS   Diarrhea.  Intestinal bleeding.  Pain.  Fever.  Throwing up (vomiting).  Tiredness (fatigue).  Weight loss.  Bowel blockage. DIAGNOSIS  The diagnosis of colitis is based on examination and stool or blood tests. X-rays, CT scan, and colonoscopy may also be needed. TREATMENT  Treatment may include:  Fluids given through the vein (intravenously).  Bowel rest (nothing to eat or drink for a period of time).  Medicine for pain and diarrhea.  Medicines (antibiotics) that kill germs.  Cortisone medicines.  Surgery. HOME CARE INSTRUCTIONS   Get plenty of rest.  Drink  enough water and fluids to keep your urine clear or pale yellow.  Eat a well-balanced diet.  Call your caregiver for follow-up as recommended. SEEK IMMEDIATE MEDICAL CARE IF:   You develop chills.  You have an oral temperature above 102 F (38.9 C), not controlled by medicine.  You have extreme weakness, fainting, or dehydration.  You have repeated vomiting.  You develop severe belly (abdominal) pain or are passing bloody or tarry stools. MAKE SURE YOU:   Understand these instructions.  Will watch your condition.  Will get help right away if you are not doing well or get worse. Document Released: 01/16/2005 Document Revised: 03/02/2012 Document Reviewed: 04/13/2010 [facility] Patient Information 2015 [place], [place]. This information is not intended to replace advice given to you by your health care provider. Make sure you discuss any questions you have with your health care provider.

## 2015-05-26 ENCOUNTER — Ambulatory Visit (INDEPENDENT_AMBULATORY_CARE_PROVIDER_SITE_OTHER): Payer: 59 | Admitting: Family Medicine

## 2015-05-26 ENCOUNTER — Encounter: Payer: Self-pay | Admitting: Family Medicine

## 2015-05-26 VITALS — BP 134/80 | HR 94 | Temp 98.3°F | Resp 16 | Ht 67.0 in | Wt 266.0 lb

## 2015-05-26 DIAGNOSIS — R1900 Intra-abdominal and pelvic swelling, mass and lump, unspecified site: Secondary | ICD-10-CM | POA: Diagnosis not present

## 2015-05-26 DIAGNOSIS — K529 Noninfective gastroenteritis and colitis, unspecified: Secondary | ICD-10-CM | POA: Diagnosis not present

## 2015-05-26 DIAGNOSIS — K635 Polyp of colon: Secondary | ICD-10-CM | POA: Insufficient documentation

## 2015-05-26 NOTE — Progress Notes (Signed)
Name: Christine Michael   MRN: [id number]    DOB: 16-Dec-1966   Date:05/26/2015       Progress Note  Subjective  Chief Complaint  Chief Complaint  Patient presents with  . Abdominal Pain    left lower quadrant seen in ER 05/21/2015  . Ovarian Cyst    Pelvic Pain The patient's primary symptoms include pelvic pain (SEEN IN ER WITH 7.7 CM LEFT PELVIC MASS OF ? ETILOLOGY.   NEEDS GYN REFERRAL AND MRI ). This is a new problem. The current episode started in the past 7 days. The problem occurs constantly. The problem has been gradually improving. The pain is moderate. The problem affects the left side. She is not pregnant. Associated symptoms include abdominal pain, anorexia, back pain, flank pain (left pelvic pain) and nausea. Pertinent negatives include no chills, constipation, diarrhea, fever, frequency, headaches, rash, urgency or vomiting. The symptoms are aggravated by activity and heavy lifting. She has tried oral narcotics for the symptoms. The treatment provided mild relief. She is not sexually active. No, her partner does not have an STD.    Ileitis  Noted on CT scan to have ileal thickening.   Patient schedule to see [facility] June 13, 2015  Past Medical History  Diagnosis Date  . Hypertension   . Anxiety   . High cholesterol     History  Substance Use Topics  . Smoking status: Former Games developer  . Smokeless tobacco: Not on file  . Alcohol Use: No     Current outpatient prescriptions:  .  atorvastatin (LIPITOR) 20 MG tablet, Take 20 mg by mouth daily., Disp: , Rfl:  .  ciprofloxacin (CIPRO) 500 MG tablet, Take 1 tablet (500 mg total) by mouth 2 (two) times daily., Disp: 14 tablet, Rfl: 0 .  citalopram (CELEXA) 20 MG tablet, Take 20 mg by mouth daily., Disp: , Rfl:  .  metroNIDAZOLE (FLAGYL) 500 MG tablet, Take 1 tablet (500 mg total) by mouth 2 (two) times daily., Disp: 14 tablet, Rfl: 0 .  naproxen (NAPROSYN) 500 MG tablet, Take 1 tablet (500 mg total) by  mouth 2 (two) times daily with a meal., Disp: 20 tablet, Rfl: 0 .  ondansetron (ZOFRAN) 4 MG tablet, Take 1 tablet (4 mg total) by mouth every 6 (six) hours as needed for nausea or vomiting., Disp: 20 tablet, Rfl: 1 .  oxyCODONE-acetaminophen (ROXICET) 5-325 MG per tablet, Take 1 tablet by mouth every 6 (six) hours as needed for severe pain., Disp: 12 tablet, Rfl: 0 .  triamterene-hydrochlorothiazide (MAXZIDE-25) 37.5-25 MG per tablet, Take 1 tablet by mouth daily., Disp: , Rfl:   No Known Allergies  Review of Systems  Constitutional: Negative for fever, chills, weight loss and malaise/fatigue.  Respiratory: Negative for shortness of breath.   Cardiovascular: Positive for chest pain and palpitations.  Gastrointestinal: Positive for nausea, abdominal pain and anorexia. Negative for vomiting, diarrhea, constipation and blood in stool.  Genitourinary: Positive for flank pain (left pelvic pain) and pelvic pain (SEEN IN ER WITH 7.7 CM LEFT PELVIC MASS OF ? ETILOLOGY.   NEEDS GYN REFERRAL AND MRI ). Negative for urgency and frequency.  Musculoskeletal: Positive for back pain.  Skin: Negative for rash.  Neurological: Negative for headaches.  Endo/Heme/Allergies: Does not bruise/bleed easily.  Psychiatric/Behavioral: Negative for depression. The patient is nervous/anxious.       Objective  Filed Vitals:   05/26/15 1045  BP: 134/80  Pulse: 94  Temp: 98.3 F (36.8 C)  TempSrc: Oral  Resp: 16  Height: 5\' 7"  (1.702 m)  Weight: 266 lb (120.657 kg)  SpO2: 96%     Physical Exam  Constitutional: She is well-developed, well-nourished, and in no distress.  HENT:  Head: Normocephalic.  Eyes: Pupils are equal, round, and reactive to light.  Abdominal: Soft. She exhibits no distension. There is tenderness.  Musculoskeletal: She exhibits no edema.  Neurological: She is alert.  Skin: Skin is warm and dry.  Psychiatric: Affect normal.       Assessment & Plan  1. Pelvic mass in  female Refer to gyn  2. Ileitis Noted on CT SCAN  Will refer to GI as appropriate in the future as needed after gyn evaluation

## 2015-05-26 NOTE — Patient Instructions (Signed)
F/u past gyn eval

## 2015-06-13 ENCOUNTER — Ambulatory Visit (INDEPENDENT_AMBULATORY_CARE_PROVIDER_SITE_OTHER): Payer: 59 | Admitting: Obstetrics and Gynecology

## 2015-06-13 ENCOUNTER — Encounter: Payer: Self-pay | Admitting: Obstetrics and Gynecology

## 2015-06-13 VITALS — BP 125/80 | HR 71 | Ht 65.5 in | Wt 268.4 lb

## 2015-06-13 DIAGNOSIS — N8320 Unspecified ovarian cysts: Secondary | ICD-10-CM

## 2015-06-13 DIAGNOSIS — N83202 Unspecified ovarian cyst, left side: Secondary | ICD-10-CM

## 2015-06-13 DIAGNOSIS — N898 Other specified noninflammatory disorders of vagina: Secondary | ICD-10-CM

## 2015-06-13 DIAGNOSIS — L299 Pruritus, unspecified: Secondary | ICD-10-CM | POA: Diagnosis not present

## 2015-06-13 MED ORDER — TERCONAZOLE 0.4 % VA CREA: 1.0000 | TOPICAL_CREAM | Freq: Every day | VAGINAL | Status: DC

## 2015-06-13 MED ORDER — NORETHINDRONE 0.35 MG PO TABS: 1.0000 | ORAL_TABLET | Freq: Every day | ORAL | Status: DC

## 2015-06-13 MED ORDER — OXYCODONE-ACETAMINOPHEN 5-325 MG PO TABS: 1.0000 | ORAL_TABLET | Freq: Four times a day (QID) | ORAL | Status: DC | PRN

## 2015-06-13 NOTE — Progress Notes (Signed)
Subjective:     Christine Michael is a 49 y.o. G80P1001 female who presents for evaluation of abdominal pain. Referred from [facility]. The pain is described as aching, intermittent, and is 8/10 in intensity. Pain is located in the deep pelvic area without radiation. Initially started in RLQ however is now noted in middle and left pelvis. Onset was sudden occurring 1 month ago. Symptoms have been unchanged since. Aggravating factors: none. Alleviating factors: pain medications (Percocet). Associated symptoms: nausea. The patient denies constipation, fever and vomiting. Risk factors for pelvic/abdominal pain include none.  CT scan performed 1 month ago notes left large hemorrhagic cyst (6 cm).  Subsequent scan 4 weeks later noted slightly larger cyst (now 7.7 cm). Was also diagnosed with ileitis, currently on antibiotics.      Menstrual History: OB History    Gravida Para Term Preterm AB TAB SAB Ectopic Multiple Living   Menarche age: 21  No LMP recorded. Patient has had a hysterectomy.    Past Medical History  Diagnosis Date  . Hypertension   . Anxiety   . High cholesterol    Family History  Problem Relation Age of Onset  . Cancer Mother   . Alcohol abuse Father    Past Surgical History  Procedure Laterality Date  . Abdominal hysterectomy  2010    partial  . Colonoscopy  2015    [facility]   History   Social History  . Marital Status: Divorced    Spouse Name: N/A  . Number of Children: N/A  . Years of Education: N/A   Occupational History  . Not on file.   Social History Main Topics  . Smoking status: Former Games developer  . Smokeless tobacco: Not on file  . Alcohol Use: No  . Drug Use: Not on file  . Sexual Activity: Not on file   Other Topics Concern  . Not on file   Social History Narrative   Current Outpatient Prescriptions on File Prior to Visit  Medication Sig Dispense Refill  . atorvastatin (LIPITOR) 20 MG tablet Take 20 mg  by mouth daily.    . ciprofloxacin (CIPRO) 500 MG tablet Take 1 tablet (500 mg total) by mouth 2 (two) times daily. 14 tablet 0  . citalopram (CELEXA) 20 MG tablet Take 20 mg by mouth daily.    . metroNIDAZOLE (FLAGYL) 500 MG tablet Take 1 tablet (500 mg total) by mouth 2 (two) times daily. 14 tablet 0  . naproxen (NAPROSYN) 500 MG tablet Take 1 tablet (500 mg total) by mouth 2 (two) times daily with a meal. 20 tablet 0  . ondansetron (ZOFRAN) 4 MG tablet Take 1 tablet (4 mg total) by mouth every 6 (six) hours as needed for nausea or vomiting. 20 tablet 1  . oxyCODONE-acetaminophen (ROXICET) 5-325 MG per tablet Take 1 tablet by mouth every 6 (six) hours as needed for severe pain. 12 tablet 0  . triamterene-hydrochlorothiazide (MAXZIDE-25) 37.5-25 MG per tablet Take 1 tablet by mouth daily.     No current facility-administered medications on file prior to visit.   No Known Allergies    Review of Systems Constitutional: negative for chills, fatigue, fevers and sweats Eyes: negative for irritation, redness and visual disturbance Ears, nose, mouth, throat, and face: negative for hearing loss, nasal congestion, snoring and tinnitus Respiratory: negative for asthma, cough, sputum Cardiovascular: positive for irregular heart beat ("flutters"); negative for  chest pain, dyspnea, exertional chest pressure/discomfort,  palpitations and syncope Gastrointestinal: positive for nausea; negative for abdominal pain, change in bowel habits, vomiting Genitourinary: positive for pelvic pain, vaginal itching, vaginal discharge; negative for abnormal menstrual periods, genital lesions, sexual problems, dysuria and urinary incontinence Integument/breast: negative for breast lump, breast tenderness and nipple discharge Hematologic/lymphatic: negative for bleeding and easy bruising Musculoskeletal:negative for back pain and muscle weakness Neurological: negative for dizziness, headaches, vertigo and  weakness Endocrine: negative for diabetic symptoms including polydipsia, polyuria and skin dryness Allergic/Immunologic: negative for hay fever and urticaria      Objective:    BP 125/80 mmHg  Pulse 71  Ht 5' 5.5" (1.664 m)  Wt 268 lb 6.4 oz (121.745 kg)  BMI 43.97 kg/m2 General:   alert, cooperative and no distress  Lungs:   clear to auscultation bilaterally  Heart:   regular rate and rhythm, S1, S2 normal, no murmur, click, rub or gallop  Abdomen:  normal findings: bowel sounds normal and no masses palpable and abnormal findings:  obese and mild tenderness in the LLQ  CVA:   absent  Pelvis:  External genitalia: normal general appearance Vaginal: normal mucosa without prolapse or lesions and discharge, white and scant Cervix: normal appearance and no cervical motion tenderness or lesions present Adnexa: tenderness in left adnexa, unable to palpate mass Uterus: removed surgically Exam limited by body habitus  Extremities:   extremities normal, atraumatic, no cyanosis or edema  Neurologic:   negative  Psychiatric:   normal mood, behavior, speech, dress, and thought processes   Lab Review Labs:  Lab Results  Component Value Date   WBC 20.3* 05/21/2015   HGB 12.1 05/21/2015   HCT 37.4 05/21/2015   MCV 79.5* 05/21/2015   PLT 209 05/21/2015   Urinalysis    Component Value Date/Time   COLORURINE YELLOW* 04/29/2015 1150   APPEARANCEUR CLEAR* 04/29/2015 1150   LABSPEC 1.016 04/29/2015 1150   PHURINE 7.0 04/29/2015 1150   GLUCOSEU NEGATIVE 04/29/2015 1150   HGBUR NEGATIVE 04/29/2015 1150   BILIRUBINUR NEGATIVE 04/29/2015 1150   KETONESUR NEGATIVE 04/29/2015 1150   PROTEINUR NEGATIVE 04/29/2015 1150   NITRITE NEGATIVE 04/29/2015 1150   LEUKOCYTESUR NEGATIVE 04/29/2015 1150      Imaging CT - Abdominal with contrast, CT - Pelvis   04/27/2015:  IMPRESSION:  1. The appendix is short and without inflammation.  2. Round mass lesion in the left anterior pelvis is likely  ovarian in origin and may represent a hemorrhagic ovarian cyst. Recommend followup ultrasound in 6-12 weeks.   05/17/2015:  IMPRESSION:   1. 7.7 x 6.5 cm cystic mass at the left hemipelvis. This has increased in size from the recent prior CT, with layering increased attenuation. This most likely reflects an enlarging hemorrhagic cyst, though a cystic ovarian tumor could have a similar appearance. Depending on the patient's symptoms, MRI could be considered for further evaluation. 2. Vague wall thickening and soft tissue inflammation involving multiple loops of small bowel in the lower abdomen and upper pelvis. This is concerning for an acute infectious or inflammatory ileitis.   Assessment:   Large left adnexal cyst  Ileitis Vaginal discharge (suspected yeast candidiasis) Generalized pruritis  Plan:    The diagnosis was discussed with the patient and evaluation and treatment plans outlined. Reassured patient that symptoms are almost certainly benign and self-resolving.  However, discussed risks of continuing enlargement of cyst as well as torsion.  Given options to initiate hormonal therapy (progesterone OCPs) to help shrink size  of cyst.  Also discussed surgical management of ovarian cystectomy vs oophorectomy.  Patient desires to try conservative management first.  Will prescribe OCPs. Patient to f/u in 2 weeks to assess for size decrease.  Understands that if cyst continues to persist, may ultimately require surgical intervention at that time. Patient notes understanding.  Given torsion precautions. Refill given on Percocet for pain. Can also use OTC NSAIDs for mild pain as needed.  Vaginal discharge likely yeast infection secondary to recent treatment with BV.  NuSwab collected.  Will emperically treat (patient notes that she purchased [place] OTC but had not initiated yet).  Can use once antibiotic course completed for ileitis.  Will order f/u pelvic sono to reassess cyst size in 2 weeks.    Continue antibiotics for ileitis as previously prescribed until therapy completed.  Patient with complaints of generalized pruritis (unsure if due to medications or something else).  Takes Benadryl prn, which helps.  To continue.  Follow up in 2 weeks or as needed.

## 2015-06-13 NOTE — Patient Instructions (Signed)
Ovarian Cyst °An ovarian cyst is a sac filled with fluid or blood. This sac is attached to the ovary. Some cysts go away on their own. Other cysts need treatment.  °HOME CARE  °· Only take medicine as told by your doctor. °· Follow up with your doctor as told. °· Get regular pelvic exams and Pap tests. °GET HELP IF: °· Your periods are late, not regular, or painful. °· You stop having periods. °· Your belly (abdominal) or pelvic pain does not go away. °· Your belly becomes large or puffy (swollen). °· You have a hard time peeing (totally emptying your bladder). °· You have pressure on your bladder. °· You have pain during sex. °· You feel fullness, pressure, or discomfort in your belly. °· You lose weight for no reason. °· You feel sick most of the time. °· You have a hard time pooping (constipation). °· You do not feel like eating. °· You develop pimples (acne). °· You have an increase in hair on your body and face. °· You are gaining weight for no reason. °· You think you are pregnant. °GET HELP RIGHT AWAY IF:  °· Your belly pain gets worse. °· You feel sick to your stomach (nauseous), and you throw up (vomit). °· You have a fever that comes on fast. °· You have belly pain while pooping (bowel movement). °· Your periods are heavier than usual. °MAKE SURE YOU:  °· Understand these instructions. °· Will watch your condition. °· Will get help right away if you are not doing well or get worse. °Document Released: 05/27/2008 Document Revised: 09/29/2013 Document Reviewed: 08/16/2013 °ExitCare® Patient Information ©2015 [employer]. This information is not intended to replace advice given to you by your health care provider. Make sure you discuss any questions you have with your health care provider. ° °

## 2015-06-14 ENCOUNTER — Encounter: Payer: Self-pay | Admitting: Obstetrics and Gynecology

## 2015-06-14 LAB — CA 125: CA 125: 12.9 U/mL (ref 0.0–38.1)

## 2015-06-15 LAB — NUSWAB VAGINITIS PLUS (VG+)
ATOPOBIUM VAGINAE: HIGH {score} — AB
BVAB 2: HIGH Score — AB
Candida glabrata, NAA: NEGATIVE
Megasphaera 1: HIGH Score — AB
Trich vag by NAA: NEGATIVE

## 2015-06-16 ENCOUNTER — Telehealth: Payer: Self-pay

## 2015-06-16 DIAGNOSIS — B9689 Other specified bacterial agents as the cause of diseases classified elsewhere: Secondary | ICD-10-CM

## 2015-06-16 DIAGNOSIS — B373 Candidiasis of vulva and vagina: Secondary | ICD-10-CM

## 2015-06-16 DIAGNOSIS — B3731 Acute candidiasis of vulva and vagina: Secondary | ICD-10-CM

## 2015-06-16 DIAGNOSIS — N760 Acute vaginitis: Principal | ICD-10-CM

## 2015-06-16 MED ORDER — TERCONAZOLE 0.4 % VA CREA: TOPICAL_CREAM | VAGINAL | Status: DC

## 2015-06-16 MED ORDER — METRONIDAZOLE 500 MG PO TABS: 500.0000 mg | ORAL_TABLET | Freq: Two times a day (BID) | ORAL | Status: DC

## 2015-06-16 NOTE — Telephone Encounter (Signed)
-----   Message from Hildred Laser, MD sent at 06/15/2015  5:09 PM EDT ----- Patient positive for BV.  Needs treatment with Flagyl (PO or vaginal gel)

## 2015-06-16 NOTE — Telephone Encounter (Signed)
Pt informed of the need for treatment of BV. Pt requests tablets.

## 2015-06-20 ENCOUNTER — Encounter: Payer: 59 | Admitting: Obstetrics and Gynecology

## 2015-06-27 ENCOUNTER — Ambulatory Visit (INDEPENDENT_AMBULATORY_CARE_PROVIDER_SITE_OTHER): Payer: 59 | Admitting: Obstetrics and Gynecology

## 2015-06-27 ENCOUNTER — Encounter: Payer: Self-pay | Admitting: Obstetrics and Gynecology

## 2015-06-27 VITALS — BP 132/84 | HR 76 | Ht 65.0 in | Wt 269.0 lb

## 2015-06-27 DIAGNOSIS — N760 Acute vaginitis: Secondary | ICD-10-CM

## 2015-06-27 DIAGNOSIS — N8320 Unspecified ovarian cysts: Secondary | ICD-10-CM

## 2015-06-27 DIAGNOSIS — N83202 Unspecified ovarian cyst, left side: Secondary | ICD-10-CM

## 2015-06-27 DIAGNOSIS — A499 Bacterial infection, unspecified: Secondary | ICD-10-CM | POA: Diagnosis not present

## 2015-06-27 DIAGNOSIS — B9689 Other specified bacterial agents as the cause of diseases classified elsewhere: Secondary | ICD-10-CM

## 2015-06-27 NOTE — Progress Notes (Signed)
Patient ID: Christine Michael, female   DOB: 10/22/1966, 49 y.o.   MRN: [id number]  Pain is much better. No soreness in pelvis at all. No vaginal discharge.

## 2015-06-27 NOTE — Progress Notes (Signed)
GYNECOLOGY PROGRESS NOTE  Subjective:    Patient ID: Christine Michael, female    DOB: 04/28/1966, 49 y.o.   MRN: [id number]  HPI  Patient is a 49 y.o. 741P1001 female who presents for follow up of vaginal itching and pelvic pain with left ovarian cyst.  Started taking Monistat last week, but NuSwab indicated BV infection, antibiotics called in.  Patient reports compliance with medications and notes improvement of symptoms.  Notes that pelvic pain has improved as well.  Was initiated on progesterone OCPs last visit.   The following portions of the patient's history were reviewed and updated as appropriate: allergies, current medications, past family history, past medical history, past social history, past surgical history and problem list.  Review of Systems A comprehensive review of systems was negative.   Objective:   Blood pressure 132/84, pulse 76, height 5\' 5"  (1.651 m), weight 269 lb (122.018 kg). General appearance: alert Abdomen: soft, non-tender; bowel sounds normal; no masses,  no organomegaly Pelvic: exam obscured by obesity, external genitalia normal, rectovaginal septum normal and vagina normal without discharge. Cervix normal without CMT. Uterus difficult to palpate due to obesity but feels normal size and shape.  Extremities: extremities normal, atraumatic, no cyanosis or edema Neurologic: Grossly normal  Labs:   Lab Results  Component Value Date   CA125 12.9 06/13/2015   Results for Christine RubensteinHARVEY, Christine K (MRN [id number]) as of 06/27/2015 09:39  Ref. Range 06/13/2015 14:06  Atopobium vaginae Latest Units: Score High - 2 (A)  BVAB 2 Latest Units: Score High - 2 (A)  Candida glabrata, NAA Latest Ref Range: Negative  Negative  Megasphaera 1 Latest Units: Score High - 2 (A)  Trich vag by NAA Latest Ref Range: Negative  Negative   Assessment/Plan:   Bacterial vaginitis - Treated 06/15/2015. Symptoms improved Left ovarian cyst - Normal CA-125.  Symptoms have improved. On progesterone  OCPs currently For pelvic ultrasound.  Follow up in 1-2 weeks after ultrasound to f/u cyst.    Hildred LaserAnika Ferron Ishmael, MD [facility] 06/27/2015 9:29 AM

## 2015-06-30 ENCOUNTER — Other Ambulatory Visit: Payer: Self-pay | Admitting: Family Medicine

## 2015-07-07 ENCOUNTER — Ambulatory Visit: Payer: 59

## 2015-07-07 DIAGNOSIS — N8320 Unspecified ovarian cysts: Secondary | ICD-10-CM | POA: Diagnosis not present

## 2015-07-07 DIAGNOSIS — N83202 Unspecified ovarian cyst, left side: Secondary | ICD-10-CM

## 2015-07-11 ENCOUNTER — Telehealth: Payer: Self-pay | Admitting: Obstetrics and Gynecology

## 2015-07-11 ENCOUNTER — Ambulatory Visit (INDEPENDENT_AMBULATORY_CARE_PROVIDER_SITE_OTHER): Payer: 59 | Admitting: Obstetrics and Gynecology

## 2015-07-11 VITALS — BP 127/76 | HR 74 | Ht 65.5 in | Wt 271.4 lb

## 2015-07-11 DIAGNOSIS — R1900 Intra-abdominal and pelvic swelling, mass and lump, unspecified site: Secondary | ICD-10-CM

## 2015-07-11 NOTE — Telephone Encounter (Signed)
Christine BlonderDenise wants to know if she can get her oxycodne refilled for her surgery. Or will you give her another one for her surgery in Aug 1

## 2015-07-11 NOTE — Telephone Encounter (Signed)
Spoke with pt she states that she is concerned about her pain medication after surgery. Advised pt that she would be given RX after her surgery.

## 2015-07-12 NOTE — Progress Notes (Signed)
GYNECOLOGY PROGRESS NOTE  Subjective:    Patient ID: Leanor RubensteinDenise K Sgroi, female    DOB: 01/28/1966, 49 y.o.   MRN: [id number]  HPI  Patient is a 49 y.o. 641P1001 female who presents for follow up of pelvic pain with left ovarian cyst.  Denies any further pelvic pain. Has been taking progesterone OCPs as prescribed x 1 month.  Repeat ultrasound performed last week, to discuss results today.   The following portions of the patient's history were reviewed and updated as appropriate: allergies, current medications, past family history, past medical history, past social history, past surgical history and problem list.  Review of Systems A comprehensive review of systems was negative.   Objective:   Blood pressure 127/76, pulse 74, height 5' 5.5" (1.664 m), weight 271 lb 6.4 oz (123.106 kg). Body mass index is 44.46 kg/(m^2). General appearance: alert Physical exam deferred.    Imaging - 07/07/2015 Pelvic Ultrasound:  Findings:  The uterus is surgically absent. Right Ovary measures 3.6 x 2.3 x 2.0 cm. It is normal in appearance. Left Ovary measures 7.3 x 5.8 x 6.4 cm. A large complex hypoechoic mass is seen in left ovary containing debris. Area measures 7.1 x 6.4 x 5.9 cm. Possible hemmorhagic cyst vs other. .  Assessment/Plan:   Left ovarian cyst - Normal CA-125.  Symptoms have improved on progesterone OCPs, however size of cyst is still essentially the same (previously 8 cm, now 7 cm).  Discussion had again on management options. Could continue with second round of OCPs and see if cyst responds (less likely) or proceed to surgical removal.  Patient desires surgical removal.  Counseled on removal of unilateral vs bilateral ovaries, including risks and benefits.  After lengthy discussion, patient agrees to bilateral ovary removal.  The risks of surgery were discussed in detail with the patient including but not limited to: bleeding which may require transfusion or reoperation; infection which may  require prolonged hospitalization or re-hospitalization and antibiotic therapy; injury to bowel, bladder, ureters and major vessels or other surrounding organs; need for additional procedures including laparotomy; thromboembolic phenomenon, incisional problems and other postoperative or anesthesia complications.  Patient was told that the likelihood that her condition and symptoms will be treated effectively with this surgical management was very high; the postoperative expectations were also discussed in detail. The patient also understands the alternative treatment options which were discussed in full. All questions were answered.  She was told that she will be contacted by our surgical scheduler regarding the time and date of her surgery; routine preoperative instructions of having nothing to eat or drink after midnight on the day prior to surgery and also coming to the hospital 1.5 hours prior to her time of surgery were also emphasized.  She was told she may be called for a preoperative appointment about a week prior to surgery and will be given further preoperative instructions at that visit. Printed patient education handouts about the procedure were given to the patient to review at home. Due to patient's BMI and body habitus, she would be a candidate for robotic laparoscopic removal.  Discussed with patient who is ok with plan.     Hildred LaserAnika Airica Schwartzkopf, MD [facility] 07/12/2015 1:37 PM

## 2015-07-12 NOTE — H&P (Addendum)
Expand All Collapse All   Subjective:    Christine RubensteinDenise K Kienast is a 49 y.o. 181P1001 female who initially presented  for evaluation of abdominal/pelvic pain with associated nausea. Was diagnosed with left large hemorrhagic cyst (~ 7.7 cm) and ileitis.  Has been treated for ileitis and has been on progesterone OCPs for treatment of cyst x 1 month.  Repeat scan performed in office today notes cyst still present.  Patient reports pain has resolved, however.  Desires to proceed with surgical management at this time.   Menstrual History: OB History    Gravida Para Term Preterm AB TAB SAB Ectopic Multiple Living   1 1 1       1       Menarche age: [age]  No LMP recorded. Patient has had a hysterectomy.    Past Medical History  Diagnosis Date  . Hypertension   . Anxiety   . High cholesterol    Family History  Problem Relation Age of Onset  . Cancer Mother   . Alcohol abuse Father    Past Surgical History  Procedure Laterality Date  . Abdominal hysterectomy  2010    partial  . Colonoscopy  2015    [facility]   History   Social History  . Marital Status: Divorced    Spouse Name: N/A  . Number of Children: N/A  . Years of Education: N/A   Occupational History  . Not on file.   Social History Main Topics  . Smoking status: Former Games developermoker  . Smokeless tobacco: Not on file  . Alcohol Use: No  . Drug Use: Not on file  . Sexual Activity: Not on file   Other Topics Concern  . Not on file   Social History Narrative   Current Outpatient Prescriptions on File Prior to Visit  Medication Sig Dispense Refill  . atorvastatin (LIPITOR) 20 MG tablet Take 20 mg by mouth daily.    . ciprofloxacin (CIPRO) 500 MG tablet Take 1 tablet (500 mg total) by mouth 2 (two) times daily. 14 tablet 0  . citalopram (CELEXA) 20 MG tablet Take 20 mg by mouth daily.     . metroNIDAZOLE (FLAGYL) 500 MG tablet Take 1 tablet (500 mg total) by mouth 2 (two) times daily. 14 tablet 0  . naproxen (NAPROSYN) 500 MG tablet Take 1 tablet (500 mg total) by mouth 2 (two) times daily with a meal. 20 tablet 0  . ondansetron (ZOFRAN) 4 MG tablet Take 1 tablet (4 mg total) by mouth every 6 (six) hours as needed for nausea or vomiting. 20 tablet 1  . oxyCODONE-acetaminophen (ROXICET) 5-325 MG per tablet Take 1 tablet by mouth every 6 (six) hours as needed for severe pain. 12 tablet 0  . triamterene-hydrochlorothiazide (MAXZIDE-25) 37.5-25 MG per tablet Take 1 tablet by mouth daily.      No Known Allergies     Review of Systems Constitutional: negative for chills, fatigue, fevers and sweats Eyes: negative for irritation, redness and visual disturbance Ears, nose, mouth, throat, and face: negative for hearing loss, nasal congestion, snoring and tinnitus Respiratory: negative for asthma, cough, sputum Cardiovascular:  negative for chest pain, dyspnea, exertional chest pressure/discomfort, palpitations and syncope Gastrointestinal:  negative for abdominal pain, change in bowel habits, vomiting or nausea Genitourinary: positive for pelvic pain (resolved); negative for abnormal menstrual periods, genital lesions, sexual problems, dysuria and urinary incontinence, vaginal discharge Integument/breast: negative for breast lump, breast tenderness and nipple discharge Hematologic/lymphatic: negative for bleeding and easy bruising  Musculoskeletal:negative for back pain and muscle weakness Neurological: negative for dizziness, headaches, vertigo and weakness Endocrine: negative for diabetic symptoms including polydipsia, polyuria and skin dryness Allergic/Immunologic: negative for hay fever and urticaria    Objective:   Blood pressure 127/76, pulse 74, height 5' 5.5" (1.664 m), weight 271 lb 6.4 oz (123.106 kg). Body mass index is 44.46  kg/(m^2).   General:  alert, cooperative and no distress, obese  Lungs:  clear to auscultation bilaterally  Heart:  regular rate and rhythm, S1, S2 normal, no murmur, click, rub or gallop  Abdomen: normal findings: bowel sounds normal and no masses palpable and abnormal findings: obese and mild tenderness in the LLQ  CVA:  absent  Pelvis: External genitalia: normal general appearance Vaginal: normal mucosa without prolapse or lesions and discharge, white and scant Cervix: normal appearance and no cervical motion tenderness or lesions present Adnexa: tenderness in left adnexa, can palpate mass, mobile, solid Uterus: removed surgically Exam limited by body habitus  Extremities:  extremities normal, atraumatic, no cyanosis or edema  Neurologic:  negative  Psychiatric:  normal mood, behavior, speech, dress, and thought processes         Labs:  Lab Results  Component Value Date   CA125 12.9 06/13/2015   Lab Results  Component Value Date   WBC 20.3* 05/21/2015   HGB 12.1 05/21/2015   HCT 37.4 05/21/2015   MCV 79.5* 05/21/2015   PLT 209 05/21/2015    Imaging:   05/17/2015 CT Scan Abdomen/Pelvis -  IMPRESSION:   1. 7.7 x 6.5 cm cystic mass at the left hemipelvis. This has increased in size from the recent prior CT, with layering increased attenuation. This most likely reflects an enlarging hemorrhagic cyst, though a cystic ovarian tumor could have a similar appearance. Depending on the patient's symptoms, MRI could be considered for further evaluation. 2. Vague wall thickening and soft tissue inflammation involving multiple loops of small bowel in the lower abdomen and upper pelvis. This is concerning for an acute infectious or inflammatory ileitis.  07/07/2015 Pelvic Ultrasound  -  Findings:  The uterus is surgically absent. Right Ovary measures 3.6 x 2.3 x 2.0 cm. It is normal in appearance. Left Ovary measures 7.3 x 5.8 x 6.4 cm. A large complex  hypoechoic mass is seen in left ovary containing debris. Area measures 7.1 x 6.4 x 5.9 cm. Possible hemmorhagic cyst vs other. . Assessment/Plan:    Left ovarian cyst - Normal CA-125. Symptoms have improved on progesterone OCPs, however size of cyst is still essentially the same (previously 8 cm, now 7 cm).  Discussion had again on management options. Could continue with second round of OCPs and see if cyst responds (less likely) or proceed to surgical removal. Patient desires surgical removal. Counseled on removal of unilateral vs bilateral ovaries, including risks and benefits. After lengthy discussion, patient agrees to bilateral ovary removal. The risks of surgery were discussed in detail with the patient including but not limited to: bleeding which may require transfusion or reoperation; infection which may require prolonged hospitalization or re-hospitalization and antibiotic therapy; injury to bowel, bladder, ureters and major vessels or other surrounding organs; need for additional procedures including laparotomy; thromboembolic phenomenon, incisional problems and other postoperative or anesthesia complications. Patient was told that the likelihood that her condition and symptoms will be treated effectively with this surgical management was very high; the postoperative expectations were also discussed in detail. The patient also understands the alternative treatment options which were discussed in full.  All questions were answered.  Consent forms signed. She was told that she will be contacted by our surgical scheduler regarding the time and date of her surgery; routine preoperative instructions of having nothing to eat or drink after midnight on the day prior to surgery and also coming to the hospital 1.5 hours prior to her time of surgery were also emphasized. She was told she may be called for a preoperative appointment about a week prior to surgery and will be given further preoperative instructions  at that visit. Printed patient education handouts about the procedure were given to the patient to review at home. To schedule for OR for robotic BSO on 07/24/2015.  Due to patient's BMI and body habitus, she would be a candidate for robotic laparoscopic removal. Discussed with patient who is ok with plan.     Hildred Laser, MD [facility]

## 2015-07-12 NOTE — Patient Instructions (Signed)
You are scheduled for surgery on 07/24/2015.  Nothing to eat after midnight on day prior to surgery.  Do not take any medications unless recommended by your provider on day prior to surgery.  Do not take NSAIDs (Motrin, Aleve) or aspirin 7 days prior to surgery.  You may take Tylenol products for minor aches and pains.  You will receive a prescription for pain medications post-operatively.

## 2015-07-13 ENCOUNTER — Ambulatory Visit
Admission: RE | Admit: 2015-07-13 | Discharge: 2015-07-13 | Disposition: A | Discharge status: Home / Self Care | Payer: 59 | Source: Ambulatory Visit | Attending: Obstetrics and Gynecology | Admitting: Obstetrics and Gynecology

## 2015-07-13 ENCOUNTER — Encounter
Admission: RE | Admit: 2015-07-13 | Discharge: 2015-07-13 | Disposition: A | Discharge status: Home / Self Care | Payer: 59 | Source: Ambulatory Visit | Attending: Obstetrics and Gynecology | Admitting: Obstetrics and Gynecology

## 2015-07-13 DIAGNOSIS — Z90710 Acquired absence of both cervix and uterus: Secondary | ICD-10-CM | POA: Diagnosis not present

## 2015-07-13 DIAGNOSIS — F419 Anxiety disorder, unspecified: Secondary | ICD-10-CM | POA: Diagnosis not present

## 2015-07-13 DIAGNOSIS — I10 Essential (primary) hypertension: Secondary | ICD-10-CM | POA: Diagnosis not present

## 2015-07-13 DIAGNOSIS — E780 Pure hypercholesterolemia: Secondary | ICD-10-CM | POA: Diagnosis not present

## 2015-07-13 DIAGNOSIS — Z87891 Personal history of nicotine dependence: Secondary | ICD-10-CM | POA: Diagnosis not present

## 2015-07-13 DIAGNOSIS — R1900 Intra-abdominal and pelvic swelling, mass and lump, unspecified site: Secondary | ICD-10-CM | POA: Insufficient documentation

## 2015-07-13 LAB — HEMOGLOBIN: Hemoglobin: 11.8 g/dL — ABNORMAL LOW (ref 12.0–16.0)

## 2015-07-13 NOTE — Patient Instructions (Signed)
  Your procedure is scheduled on: Monday July 24, 2015 Report to Same Day Surgery. To find out your arrival time please call [phone] between 1PM - 3PM on Friday July 21, 2015 .  Remember: Instructions that are not followed completely may result in serious medical risk, up to and including death, or upon the discretion of your surgeon and anesthesiologist your surgery may need to be rescheduled.    _x___ 1. Do not eat food or drink liquids after midnight. No gum chewing or hard candies.     ____ 2. No Alcohol for 24 hours before or after surgery.   ____ 3. Bring all medications with you on the day of surgery if instructed.    _x__ 4. Notify your doctor if there is any change in your medical condition     (cold, fever, infections).     Do not wear jewelry, make-up, hairpins, clips or nail polish.  Do not wear lotions, powders, or perfumes. You may wear deodorant.  Do not shave 48 hours prior to surgery. Men may shave face and neck.  Do not bring valuables to the hospital.    [facility] is not responsible for any belongings or valuables.               Contacts, dentures or bridgework may not be worn into surgery.  Leave your suitcase in the car. After surgery it may be brought to your room.  For patients admitted to the hospital, discharge time is determined by your  treatment team.   Patients discharged the day of surgery will not be allowed to drive home.    Please read over the following fact sheets that you were given:   [facility] Preparing for Surgery  _x___ Take these medicines the morning of surgery with A SIP OF WATER:    1. citalopram (CELEXA)    ____ Fleet Enema (as directed)   __x__ Use CHG Soap as directed  ____ Use inhalers on the day of surgery  ____ Stop metformin 2 days prior to surgery    ____ Take 1/2 of usual insulin dose the night before surgery and none on the morning of surgery.   ____ Stop Coumadin/Plavix/aspirin on Does not apply  __x__  Stop Anti-inflammatories(BC Powders) on today.  May take Tylenol or Oxycodone for pain.   ____ Stop supplements until after surgery.    __x__ Bring C-Pap to the hospital.

## 2015-07-24 ENCOUNTER — Encounter
Admission: AD | Disposition: A | Discharge status: Home / Self Care | Payer: Self-pay | Source: Ambulatory Visit | Attending: Obstetrics and Gynecology

## 2015-07-24 ENCOUNTER — Ambulatory Visit: Payer: 59 | Admitting: Anesthesiology

## 2015-07-24 ENCOUNTER — Encounter: Payer: Self-pay | Admitting: Anesthesiology

## 2015-07-24 ENCOUNTER — Observation Stay
Admission: AD | Admit: 2015-07-24 | Discharge: 2015-07-25 | Disposition: A | Discharge status: Home / Self Care | Payer: 59 | Source: Ambulatory Visit | Attending: Obstetrics and Gynecology | Admitting: Obstetrics and Gynecology

## 2015-07-24 DIAGNOSIS — R102 Pelvic and perineal pain: Secondary | ICD-10-CM | POA: Diagnosis present

## 2015-07-24 DIAGNOSIS — Z6841 Body Mass Index (BMI) 40.0 and over, adult: Secondary | ICD-10-CM | POA: Diagnosis not present

## 2015-07-24 DIAGNOSIS — N8329 Other ovarian cysts: Secondary | ICD-10-CM | POA: Diagnosis not present

## 2015-07-24 DIAGNOSIS — N801 Endometriosis of ovary: Secondary | ICD-10-CM | POA: Diagnosis not present

## 2015-07-24 DIAGNOSIS — N838 Other noninflammatory disorders of ovary, fallopian tube and broad ligament: Secondary | ICD-10-CM | POA: Insufficient documentation

## 2015-07-24 DIAGNOSIS — N8320 Unspecified ovarian cysts: Secondary | ICD-10-CM | POA: Diagnosis not present

## 2015-07-24 DIAGNOSIS — N736 Female pelvic peritoneal adhesions (postinfective): Secondary | ICD-10-CM | POA: Insufficient documentation

## 2015-07-24 DIAGNOSIS — N949 Unspecified condition associated with female genital organs and menstrual cycle: Secondary | ICD-10-CM | POA: Diagnosis present

## 2015-07-24 HISTORY — PX: SALPINGOOPHORECTOMY: SHX82

## 2015-07-24 LAB — TYPE AND SCREEN
ABO/RH(D): O POS
Antibody Screen: NEGATIVE

## 2015-07-24 LAB — ABO/RH: ABO/RH(D): O POS

## 2015-07-24 SURGERY — SALPINGO-OOPHORECTOMY, OPEN
Anesthesia: General | Laterality: Bilateral

## 2015-07-24 MED ORDER — CEFAZOLIN SODIUM 10 G IJ SOLR
3.0000 g | INTRAMUSCULAR | Status: DC | PRN
  Administered 2015-07-24: 3000 mg via INTRAVENOUS

## 2015-07-24 MED ORDER — MENTHOL 3 MG MT LOZG: 1.0000 | LOZENGE | OROMUCOSAL | Status: DC | PRN

## 2015-07-24 MED ORDER — LACTATED RINGERS IV SOLN
INTRAVENOUS | Status: DC
  Administered 2015-07-24 – 2015-07-25 (×2): via INTRAVENOUS

## 2015-07-24 MED ORDER — ROCURONIUM BROMIDE 100 MG/10ML IV SOLN
INTRAVENOUS | Status: DC | PRN
  Administered 2015-07-24: 20 mg via INTRAVENOUS
  Administered 2015-07-24: 50 mg via INTRAVENOUS
  Administered 2015-07-24: 20 mg via INTRAVENOUS

## 2015-07-24 MED ORDER — LIDOCAINE 5 % EX PTCH
MEDICATED_PATCH | CUTANEOUS | Status: DC | PRN
  Administered 2015-07-24: 1 via TRANSDERMAL

## 2015-07-24 MED ORDER — PROMETHAZINE HCL 25 MG/ML IJ SOLN
6.2500 mg | INTRAMUSCULAR | Status: DC | PRN
Start: 2015-07-24 — End: 2015-07-24

## 2015-07-24 MED ORDER — BUPIVACAINE HCL (PF) 0.5 % IJ SOLN
INTRAMUSCULAR | Status: AC
  Filled 2015-07-24: qty 30

## 2015-07-24 MED ORDER — HYDROMORPHONE HCL 1 MG/ML IJ SOLN: 0.2000 mg | INTRAMUSCULAR | Status: DC | PRN

## 2015-07-24 MED ORDER — DEXAMETHASONE SODIUM PHOSPHATE 4 MG/ML IJ SOLN
INTRAMUSCULAR | Status: DC | PRN
  Administered 2015-07-24: 10 mg via INTRAVENOUS

## 2015-07-24 MED ORDER — LIDOCAINE HCL (CARDIAC) 20 MG/ML IV SOLN
INTRAVENOUS | Status: DC | PRN
  Administered 2015-07-24 (×2): 100 mg via INTRAVENOUS

## 2015-07-24 MED ORDER — KETOROLAC TROMETHAMINE 30 MG/ML IJ SOLN: 30.0000 mg | Freq: Once | INTRAMUSCULAR | Status: DC

## 2015-07-24 MED ORDER — ONDANSETRON HCL 4 MG/2ML IJ SOLN: 4.0000 mg | Freq: Four times a day (QID) | INTRAMUSCULAR | Status: DC | PRN

## 2015-07-24 MED ORDER — ONDANSETRON HCL 4 MG/2ML IJ SOLN
INTRAMUSCULAR | Status: DC | PRN
  Administered 2015-07-24: 4 mg via INTRAVENOUS

## 2015-07-24 MED ORDER — PROPOFOL 10 MG/ML IV BOLUS
INTRAVENOUS | Status: DC | PRN
  Administered 2015-07-24: 180 mg via INTRAVENOUS

## 2015-07-24 MED ORDER — IBUPROFEN 600 MG PO TABS: 600.0000 mg | ORAL_TABLET | Freq: Four times a day (QID) | ORAL | Status: DC | PRN

## 2015-07-24 MED ORDER — BUPIVACAINE HCL 0.5 % IJ SOLN
INTRAMUSCULAR | Status: DC | PRN
Start: 2015-07-24 — End: 2015-07-24
  Administered 2015-07-24: 22 mL

## 2015-07-24 MED ORDER — FENTANYL CITRATE (PF) 100 MCG/2ML IJ SOLN: 25.0000 ug | INTRAMUSCULAR | Status: DC | PRN

## 2015-07-24 MED ORDER — LIDOCAINE 5 % EX PTCH
MEDICATED_PATCH | CUTANEOUS | Status: AC
  Filled 2015-07-24: qty 1

## 2015-07-24 MED ORDER — LIDOCAINE 5 % EX PTCH: 1.0000 | MEDICATED_PATCH | CUTANEOUS | Status: DC

## 2015-07-24 MED ORDER — NEOSTIGMINE METHYLSULFATE 10 MG/10ML IV SOLN
INTRAVENOUS | Status: DC | PRN
Start: 2015-07-24 — End: 2015-07-24
  Administered 2015-07-24: 3 mg via INTRAVENOUS

## 2015-07-24 MED ORDER — LACTATED RINGERS IV SOLN
INTRAVENOUS | Status: DC
  Administered 2015-07-24 (×2): via INTRAVENOUS

## 2015-07-24 MED ORDER — FAMOTIDINE 20 MG PO TABS
ORAL_TABLET | ORAL | Status: AC
  Administered 2015-07-24: 20 mg via ORAL
  Filled 2015-07-24: qty 1

## 2015-07-24 MED ORDER — OXYCODONE-ACETAMINOPHEN 5-325 MG PO TABS: 1.0000 | ORAL_TABLET | ORAL | Status: DC | PRN

## 2015-07-24 MED ORDER — SIMETHICONE 80 MG PO CHEW: 80.0000 mg | CHEWABLE_TABLET | Freq: Four times a day (QID) | ORAL | Status: DC | PRN

## 2015-07-24 MED ORDER — MIDAZOLAM HCL 2 MG/2ML IJ SOLN
INTRAMUSCULAR | Status: DC | PRN
  Administered 2015-07-24: 2 mg via INTRAVENOUS

## 2015-07-24 MED ORDER — ONDANSETRON HCL 4 MG PO TABS: 4.0000 mg | ORAL_TABLET | Freq: Four times a day (QID) | ORAL | Status: DC | PRN

## 2015-07-24 MED ORDER — EPHEDRINE SULFATE 50 MG/ML IJ SOLN
INTRAMUSCULAR | Status: DC | PRN
  Administered 2015-07-24: 10 mg via INTRAVENOUS

## 2015-07-24 MED ORDER — FENTANYL CITRATE (PF) 100 MCG/2ML IJ SOLN
INTRAMUSCULAR | Status: DC | PRN
  Administered 2015-07-24 (×2): 250 ug via INTRAVENOUS

## 2015-07-24 MED ORDER — GLYCOPYRROLATE 0.2 MG/ML IJ SOLN
INTRAMUSCULAR | Status: DC | PRN
  Administered 2015-07-24: .5 mg via INTRAVENOUS

## 2015-07-24 MED ORDER — ACETAMINOPHEN 10 MG/ML IV SOLN
INTRAVENOUS | Status: DC | PRN
  Administered 2015-07-24: 1000 mg via INTRAVENOUS

## 2015-07-24 MED ORDER — ACETAMINOPHEN 10 MG/ML IV SOLN
INTRAVENOUS | Status: AC
  Filled 2015-07-24: qty 100

## 2015-07-24 MED ORDER — FAMOTIDINE 20 MG PO TABS
20.0000 mg | ORAL_TABLET | Freq: Once | ORAL | Status: AC
  Administered 2015-07-24: 20 mg via ORAL

## 2015-07-24 MED ORDER — PANTOPRAZOLE SODIUM 40 MG PO TBEC
40.0000 mg | DELAYED_RELEASE_TABLET | Freq: Every day | ORAL | Status: DC
  Administered 2015-07-24 – 2015-07-25 (×2): 40 mg via ORAL
  Filled 2015-07-24 (×2): qty 1

## 2015-07-24 SURGICAL SUPPLY — 74 items
BAG URO DRAIN 2000ML W/SPOUT (MISCELLANEOUS) ×2 IMPLANT
BINDER ABDOMINAL 12 ML 46-62 (SOFTGOODS) ×2 IMPLANT
BLADE SURG 10 STRL SS SAFETY (BLADE) ×2 IMPLANT
BLADE SURG SZ11 CARB STEEL (BLADE) ×2 IMPLANT
CANISTER SUCT 1200ML W/VALVE (MISCELLANEOUS) ×2 IMPLANT
CANNULA SEALS 8.5MM (CANNULA) ×1
CATH FOLEY 2WAY  5CC 16FR (CATHETERS) ×1
CATH URTH 16FR FL 2W BLN LF (CATHETERS) ×1 IMPLANT
CHLORAPREP W/TINT 26ML (MISCELLANEOUS) ×2 IMPLANT
CORD BIP STRL DISP 12FT (MISCELLANEOUS) ×2 IMPLANT
CORD MONOPOLAR M/FML 12FT (MISCELLANEOUS) ×2 IMPLANT
COVER TIP SHEARS 8 DVNC (MISCELLANEOUS) ×1 IMPLANT
COVER TIP SHEARS 8MM DA VINCI (MISCELLANEOUS) ×1
DEFOGGER SCOPE WARMER CLEARIFY (MISCELLANEOUS) ×2 IMPLANT
DRAPE 3 ARM ACCESS DA VINCI (DRAPES) ×1
DRAPE 3 ARM ACCESS DVNC (DRAPES) ×1 IMPLANT
DRAPE SHEET LG 3/4 BI-LAMINATE (DRAPES) ×4 IMPLANT
DRSG TELFA 3X8 NADH (GAUZE/BANDAGES/DRESSINGS) ×2 IMPLANT
FILTER LAP SMOKE EVAC STRL (MISCELLANEOUS) ×2 IMPLANT
GAUZE SPONGE 4X4 12PLY STRL (GAUZE/BANDAGES/DRESSINGS) ×2 IMPLANT
GLOVE BIO SURGEON STRL SZ 6 (GLOVE) ×12 IMPLANT
GLOVE BIOGEL PI IND STRL 6.5 (GLOVE) ×7 IMPLANT
GLOVE BIOGEL PI INDICATOR 6.5 (GLOVE) ×7
GOWN STRL REUS W/ TWL LRG LVL3 (GOWN DISPOSABLE) ×8 IMPLANT
GOWN STRL REUS W/TWL LRG LVL3 (GOWN DISPOSABLE) ×8
GRASPER SUT TROCAR 14GX15 (MISCELLANEOUS) ×2 IMPLANT
IRRIGATION STRYKERFLOW (MISCELLANEOUS) ×1 IMPLANT
IRRIGATOR STRYKERFLOW (MISCELLANEOUS) ×2
IV NS 1000ML (IV SOLUTION) ×1
IV NS 1000ML BAXH (IV SOLUTION) ×1 IMPLANT
KIT PINK PAD W/HEAD ARE REST (MISCELLANEOUS) ×2
KIT PINK PAD W/HEAD ARM REST (MISCELLANEOUS) ×1 IMPLANT
LABEL OR SOLS (LABEL) ×2 IMPLANT
LAPSAC SURG PACK 8X10 (MISCELLANEOUS) ×4
LIGASURE 5MM LAPAROSCOPIC (INSTRUMENTS) ×2 IMPLANT
LIQUID BAND (GAUZE/BANDAGES/DRESSINGS) ×2 IMPLANT
MANIPULATOR VCARE LG CRV RETR (MISCELLANEOUS) IMPLANT
MANIPULATOR VCARE STD CRV RETR (MISCELLANEOUS) IMPLANT
NEEDLE INSUFFLATION 150MM (ENDOMECHANICALS) ×2 IMPLANT
NEEDLE VERESS 14GA 120MM (NEEDLE) ×2 IMPLANT
NS IRRIG 1000ML POUR BTL (IV SOLUTION) ×2 IMPLANT
OCCLUDER COLPOPNEUMO (BALLOONS) IMPLANT
PACK GYN LAPAROSCOPIC (MISCELLANEOUS) ×2 IMPLANT
PACK SURG LAPSAC 8X10 (MISCELLANEOUS) ×2 IMPLANT
PAD GROUND ADULT SPLIT (MISCELLANEOUS) ×2 IMPLANT
PAD OB MATERNITY 4.3X12.25 (PERSONAL CARE ITEMS) ×2 IMPLANT
PAD PREP 24X41 OB/GYN DISP (PERSONAL CARE ITEMS) ×2 IMPLANT
SCISSORS METZENBAUM CVD 33 (INSTRUMENTS) IMPLANT
SEAL CANN 8.5 DVNC (CANNULA) ×1 IMPLANT
SET CYSTO W/LG BORE CLAMP LF (SET/KITS/TRAYS/PACK) ×2 IMPLANT
SOLUTION ELECTROLUBE (MISCELLANEOUS) IMPLANT
SPONGE LAP 18X18 5 PK (GAUZE/BANDAGES/DRESSINGS) ×2 IMPLANT
SPONGE XRAY 4X4 16PLY STRL (MISCELLANEOUS) ×4 IMPLANT
STAPLER SKIN PROX 35W (STAPLE) ×2 IMPLANT
SUT DVC VLOC 180 0 12IN GS21 (SUTURE)
SUT MNCRL 4-0 (SUTURE) ×1
SUT MNCRL 4-0 27XMFL (SUTURE) ×1
SUT PDS AB 0 CT1 27 (SUTURE) IMPLANT
SUT PDS AB 1 TP1 96 (SUTURE) ×2 IMPLANT
SUT SILK 3-0 (SUTURE) ×2 IMPLANT
SUT VIC AB 2-0 CT1 27 (SUTURE) ×1
SUT VIC AB 2-0 CT1 36 (SUTURE) ×2 IMPLANT
SUT VIC AB 2-0 CT1 TAPERPNT 27 (SUTURE) ×1 IMPLANT
SUT VICRYL 0 AB UR-6 (SUTURE) ×2 IMPLANT
SUTURE DVC VLC 180 0 12IN GS21 (SUTURE) IMPLANT
SUTURE MNCRL 4-0 27XMF (SUTURE) ×1 IMPLANT
SYR 50ML LL SCALE MARK (SYRINGE) ×2 IMPLANT
SYRINGE 10CC LL (SYRINGE) ×2 IMPLANT
TROCAR 12M 150ML BLUNT (TROCAR) ×2 IMPLANT
TROCAR 5M 150ML BLDLS (TROCAR) ×2 IMPLANT
TROCAR BALLN 12MMX100 BLUNT (TROCAR) ×2 IMPLANT
TROCAR ENDO BLADELESS 11MM (ENDOMECHANICALS) ×2 IMPLANT
TROCAR XCEL 12X100 BLDLESS (ENDOMECHANICALS) ×2 IMPLANT
TUBING INSUFFLATOR HEATED (MISCELLANEOUS) ×2 IMPLANT

## 2015-07-24 NOTE — Transfer of Care (Signed)
Immediate Anesthesia Transfer of Care Note  Patient: Christine Michael  Procedure(s) Performed: Procedure(s): ATTEMPTED LAPAROSCOPY, ABDOMINAL BILATERAL SALPINGO OOPHORECTOMY  (Bilateral)  Patient Location: PACU  Anesthesia Type:General  Level of Consciousness: awake, alert  and patient cooperative  Airway & Oxygen Therapy: Patient Spontanous Breathing and Patient connected to nasal cannula oxygen  Post-op Assessment: Report given to RN and Post -op Vital signs reviewed and stable  Post vital signs: Reviewed and stable  Last Vitals:  Filed Vitals:   07/24/15 1757  BP: 143/69  Pulse:   Temp: 36.6 C  Resp: 25    Complications: No apparent anesthesia complications

## 2015-07-24 NOTE — Anesthesia Postprocedure Evaluation (Signed)
  Anesthesia Post-op Note  Patient: Christine Michael  Procedure(s) Performed: Procedure(s): ATTEMPTED LAPAROSCOPY, ABDOMINAL BILATERAL SALPINGO OOPHORECTOMY  (Bilateral)  Anesthesia type:General ETT  Patient location: PACU  Post pain: Pain level controlled  Post assessment: Post-op Vital signs reviewed, Patient's Cardiovascular Status Stable, Respiratory Function Stable, Patent Airway and No signs of Nausea or vomiting  Post vital signs: Reviewed and stable  Last Vitals:  Filed Vitals:   07/24/15 1841  BP: 114/63  Pulse: 79  Temp:   Resp: 19    Level of consciousness: awake, alert  and patient cooperative  Complications: No apparent anesthesia complications

## 2015-07-24 NOTE — H&P (Signed)
UPDATE TO PREVIOUS HISTORY AND PHYSICAL  The patient has been seen and examined.  H&P is up to date, no changes noted.  Patient can proceed to the OR for scheduled procedure of robotic BSO.   Hildred Laser, MD 07/24/2015 11:45 AM

## 2015-07-24 NOTE — Anesthesia Procedure Notes (Signed)
Procedure Name: Intubation Date/Time: 07/24/2015 1:38 PM Performed by: Shirlee Limerick, Evelyna Folker Pre-anesthesia Checklist: Patient identified, Emergency Drugs available, Suction available and Patient being monitored Patient Re-evaluated:Patient Re-evaluated prior to inductionOxygen Delivery Method: Circle system utilized Preoxygenation: Pre-oxygenation with 100% oxygen Laryngoscope Size: Mac and 3 Grade View: Grade II Tube size: 7.0 mm Number of attempts: 1

## 2015-07-24 NOTE — Anesthesia Preprocedure Evaluation (Addendum)
Anesthesia Evaluation  Patient identified by MRN, date of birth, ID band Patient awake    Reviewed: Allergy & Precautions, H&P , NPO status , Patient's Chart, lab work & pertinent test results, reviewed documented beta blocker date and time   Airway Mallampati: III  TM Distance: >3 FB Neck ROM: full    Dental no notable dental hx.    Pulmonary neg shortness of breath, sleep apnea and Continuous Positive Airway Pressure Ventilation , former smoker,  breath sounds clear to auscultation  Pulmonary exam normal       Cardiovascular Exercise Tolerance: Good hypertension, On Medications - angina- CAD, - Past MI and - CABG negative cardio ROS  Rhythm:regular Rate:Normal     Neuro/Psych negative neurological ROS  negative psych ROS   GI/Hepatic Neg liver ROS, GERD-  Medicated and Controlled,  Endo/Other  negative endocrine ROS  Renal/GU negative Renal ROS  negative genitourinary   Musculoskeletal   Abdominal   Peds  Hematology negative hematology ROS (+)   Anesthesia Other Findings   Reproductive/Obstetrics negative OB ROS                            Anesthesia Physical Anesthesia Plan  ASA: III  Anesthesia Plan: General ETT   Post-op Pain Management:    Induction:   Airway Management Planned:   Additional Equipment:   Intra-op Plan:   Post-operative Plan:   Informed Consent: I have reviewed the patients History and Physical, chart, labs and discussed the procedure including the risks, benefits and alternatives for the proposed anesthesia with the patient or authorized representative who has indicated his/her understanding and acceptance.   Dental Advisory Given  Plan Discussed with: CRNA and Anesthesiologist  Anesthesia Plan Comments:         Anesthesia Quick Evaluation

## 2015-07-24 NOTE — Op Note (Addendum)
Laparotomy Procedure Note  Indications: 49 y.o. G1P1001 morbidly obese female with persistent large left adnexal cyst, failed medical management, and normal CA-125.   Pre-operative Diagnosis:  Left adnexal cyst  Post-operative Diagnosis: Same, with pelvic adhesions.   Operation: Attempted laparoscopy, abdominal bilateral salpingo-oophorectomy, lysis of adhesions, bowel serosal repair  Surgeon: Hildred Laser, MD  Assistants: Natale Lay, MD  Anesthesia: General endotracheal anesthesia  ASA Class: 3   Findings: Large pannus Left large adnexal cyst with dark brown blood (suspected "chocolate cyst"). Normal appearing fallopian tubes bilaterally and right ovary.  Dense adhesions of adnexal cyst to pelvic sidewall and area of bowel  Estimated Blood Loss:  less than 100 mL         Drains: foley catheter to gravity, with 200 ml clear urine at end of the procedure.          Total IV Fluids: 1700 ml         Specimens: Bilateral ovaries (with left ovarian cyst) and fallopian tubes           Complications:  None; patient tolerated the procedure well.         Disposition: PACU - hemodynamically stable.         Condition: stable  Procedure Details  The patient was seen in the Holding Room. The risks, benefits, complications, treatment options, and expected outcomes were discussed with the patient.  The patient concurred with the proposed plan, giving informed consent.  The site of surgery properly noted/marked. The patient was taken to Operating Room # 4, identified as Christine Michael and the procedure verified as robotic bilateral salpingo-oophorectomy. A Time Out was held and the above information confirmed.  After induction of anesthesia, the patient was draped and prepped in the usual sterile manner. Pt was placed in the dorsal lithotomy position. A Foley catheter was placed.  A 10 mm infraumbilical incision and a Veress needle was inserted into the abdomen. The incision was injected  with ~ 10 cc of Sensorcaine.  After several attempts, the abdomen was unable to be insufflated due to inability to introduce needle through the abdominal cavity.  Next, an 11 mm trochar with sheath was attempted to be introduced into the abdominal cavity under direct visualization, however the trochar length was noted to be insufficient due to large body habitus.  After this, attempts were made to gain entrance into the abdominal cavity using the [facility] technique.  Entrance into the abdominal cavity was made, however insufflation of the abdominal cavity was then noted to be inadequate and there was noted to be dark red blood coming from the abdominal cavity.  Attention was turned to the RUQ, where a second attempt was made to gain entrance into the abdominal cavity.   Another 10 mm incision was made and a 5 mm trochar and sheath were attempted to be advanced under direct visualization. This incision was injected with ~ 10 cc of 0.5 Sensorcaine.  However due to body habitus, entrance still could not be made with the trochar.  At this time, the decision was made to proceed with laparotomy.    A midline infraumbilical incision was made and carried through the subcutaneous tissue to the fascia. Fascial incision was made and extended vertically. The rectus muscles was dissected off the fascia. The peritoneum was identified and entered. Peritoneal incision was extended longitudinally.  The above findings were noted. A Bookwalter retractor was placed and bowel was packed away from the surgical site.   The right infundibulopelvic  ligament was identified, coagulated and ligated using the Ligasure device. The left ovary with cyst was noted to be collapsed as it was noted to have ruptured during the time of attempted laparoscopy.  Dense adhesions were noted to the bowel and pelvic sidewall.  The bowel adhesions were taken down sharply with Metzenbaum scissors as well as blunt dissection (performed by Dr. Natale Lay, in  attendance initially as proctor for robotic procedure). After dissection, there was noted to be a serosal tear, ~ 3 cm in length.  This was oversewn with sutures of 2-0 Nylon in a figure-of- eight fashion.   Following this, the peritoneal adhesions of the cyst to the sidewall were lysed.  The ovary and cyst were noted to be better mobilized.  The left infundibulopelvic ligament was then coagulated and ligated with the Ligasure device.  Good hemostasis was noted. The abdomen was then irrigated and cleared of clots and debris.   The retractor and all packing was removed from the abdomen. The fascia was approximated with 2 running sutures of 0-PDS. Lavage was again carried out. Hemostasis was observed. The subcutaneous fat tissue layer was closed in 2 layers using 2-0 Vicryl in a running fashion.  The skin was approximated with staples.  A Lidoderm patch was placed over the incision.  The RUQ port incision was closed using a figure-of-eight suture of 2-0 Vicryl and covered with Liquiband.   Instrument, sponge, and needle counts were correct prior to abdominal closure and at the conclusion of the case.     Hildred Laser, MD [facility]

## 2015-07-25 ENCOUNTER — Encounter: Payer: Self-pay | Admitting: Obstetrics and Gynecology

## 2015-07-25 DIAGNOSIS — N801 Endometriosis of ovary: Secondary | ICD-10-CM | POA: Diagnosis not present

## 2015-07-25 LAB — CBC
HEMATOCRIT: 32.9 % — AB (ref 35.0–47.0)
Hemoglobin: 10.8 g/dL — ABNORMAL LOW (ref 12.0–16.0)
MCH: 25.8 pg — ABNORMAL LOW (ref 26.0–34.0)
MCHC: 32.7 g/dL (ref 32.0–36.0)
MCV: 78.9 fL — ABNORMAL LOW (ref 80.0–100.0)
Platelets: 187 10*3/uL (ref 150–440)
RBC: 4.18 MIL/uL (ref 3.80–5.20)
RDW: 14.1 % (ref 11.5–14.5)
WBC: 12.2 10*3/uL — ABNORMAL HIGH (ref 3.6–11.0)

## 2015-07-25 MED ORDER — OXYCODONE-ACETAMINOPHEN 5-325 MG PO TABS: 1.0000 | ORAL_TABLET | Freq: Four times a day (QID) | ORAL | Status: DC | PRN

## 2015-07-25 MED ORDER — DOCUSATE SODIUM 100 MG PO CAPS
100.0000 mg | ORAL_CAPSULE | Freq: Two times a day (BID) | ORAL | Status: DC | PRN
Start: 2015-07-25 — End: 2015-08-03

## 2015-07-25 MED ORDER — FERROUS SULFATE 325 (65 FE) MG PO TABS: 325.0000 mg | ORAL_TABLET | Freq: Every day | ORAL | Status: DC

## 2015-07-25 MED ORDER — DEXTROSE 5 % IV SOLN
3.0000 g | Freq: Four times a day (QID) | INTRAVENOUS | Status: DC
  Administered 2015-07-25: 3 g via INTRAVENOUS
  Filled 2015-07-25 (×3): qty 3000

## 2015-07-25 MED ORDER — SIMETHICONE 80 MG PO CHEW: 80.0000 mg | CHEWABLE_TABLET | Freq: Four times a day (QID) | ORAL | Status: DC | PRN

## 2015-07-25 MED ORDER — LIDOCAINE 5 % EX PTCH: 1.0000 | MEDICATED_PATCH | CUTANEOUS | Status: DC

## 2015-07-25 MED ORDER — IBUPROFEN 800 MG PO TABS: 800.0000 mg | ORAL_TABLET | Freq: Three times a day (TID) | ORAL | Status: DC | PRN

## 2015-07-25 NOTE — Discharge Summary (Signed)
Gynecology Physician Postoperative Discharge Summary  Patient ID: Christine Michael MRN: [id number] DOB/AGE: 27-Jun-1966 49 y.o.  Admit Date: 07/24/2015 Discharge Date: 07/25/2015  Preoperative Diagnoses: Left adnexal cyst, pelvic pain  Procedures: Procedure(s) (LRB): ATTEMPTED LAPAROSCOPY, ABDOMINAL BILATERAL SALPINGO OOPHORECTOMY  (Bilateral)  Significant Labs: CBC Latest Ref Rng 07/25/2015 07/13/2015  WBC 3.6 - 11.0 K/uL 12.2(H) -  Hemoglobin 12.0 - 16.0 g/dL 10.8(L) 11.8(L)  Hematocrit 35.0 - 47.0 % 32.9(L) -  Platelets 150 - 440 K/uL 187 -    Hospital Course:  Christine Michael is a 49 y.o. G1P1001  admitted for scheduled surgery.  She underwent the procedures as mentioned above, her operation was uncomplicated. For further details about surgery, please refer to the operative report. Patient had an uncomplicated postoperative course. By time of discharge on POD#1, her pain was controlled on oral pain medications; she was ambulating, voiding without difficulty, tolerating regular diet and passing flatus. She was deemed stable for discharge to home.   Discharge Exam: Blood pressure 139/71, pulse 79, temperature 99.4 F (37.4 C), temperature source Oral, resp. rate 20, height  (1.651 m), weight 287 lb (130.182 kg), SpO2 100 %. General appearance: alert and no distress  Resp: clear to auscultation bilaterally  Cardio: regular rate and rhythm  GI: soft, non-tender; bowel sounds normal; no masses, no organomegaly.  Incision: C/D/I, no erythema, no drainage noted, staples present.  Pelvic: no blood on pad  Extremities: extremities normal, atraumatic, no cyanosis or edema and Homans sign is negative, no sign of DVT  Discharged Condition: Stable  Disposition: 01-Home or Self Care     Medication List    STOP taking these medications        naproxen 500 MG tablet  Commonly known as:  NAPROSYN     norethindrone 0.35 MG tablet  Commonly known as:  MICRONOR,CAMILA,ERRIN     ondansetron 4 MG tablet  Commonly known as:  ZOFRAN      TAKE these medications        atorvastatin 20 MG tablet  Commonly known as:  LIPITOR  Take 20 mg by mouth at bedtime.     citalopram 20 MG tablet  Commonly known as:  CELEXA  Take 1 tablet by mouth  daily     docusate sodium 100 MG capsule  Commonly known as:  COLACE  Take 1 capsule (100 mg total) by mouth 2 (two) times daily as needed.     ferrous sulfate 325 (65 FE) MG tablet  Commonly known as:  FERROUSUL  Take 1 tablet (325 mg total) by mouth daily with breakfast.     ibuprofen 800 MG tablet  Commonly known as:  ADVIL,MOTRIN  Take 1 tablet (800 mg total) by mouth every 8 (eight) hours as needed.     lidocaine 5 %  Commonly known as:  LIDODERM  Place 1 patch onto the skin daily. Remove & Discard patch within 12 hours or as directed by MD     oxyCODONE-acetaminophen 5-325 MG per tablet  Commonly known as:  PERCOCET/ROXICET  Take 1-2 tablets by mouth every 6 (six) hours as needed for severe pain (moderate to severe pain (when tolerating fluids)).     simethicone 80 MG chewable tablet  Commonly known as:  MYLICON  Chew 1 tablet (80 mg total) by mouth 4 (four) times daily as needed for flatulence.     triamterene-hydrochlorothiazide 37.5-25 MG per tablet  Commonly known as:  MAXZIDE-25  Take 1 tablet by mouth every morning.  Follow-up Information    Follow up with Hildred Laser, MD In 10 days.   Specialties:  Obstetrics and Gynecology, Radiology   Why:  For staple removal   Contact information:   [address] [place] [address] [phone]       Signed: Hildred Laser, MD [facility]

## 2015-07-25 NOTE — Discharge Instructions (Signed)
General Gynecological Post-Operative Instructions You may expect to feel dizzy, weak, and drowsy for as long as 24 hours after receiving the medicine that made you sleep (anesthetic).  Do not drive a car, ride a bicycle, participate in physical activities, or take public transportation until you are done taking narcotic pain medicines or as directed by your doctor.  Do not drink alcohol or take tranquilizers.  Do not take medicine that has not been prescribed by your doctor.  Do not sign important papers or make important decisions while on narcotic pain medicines.  Have a responsible person with you.  CARE OF INCISION  Keep incision clean and dry. Take showers instead of baths until your doctor gives you permission to take baths.  Avoid heavy lifting (more than 10 pounds/4.5 kilograms), pushing, or pulling.  Avoid activities that may risk injury to your surgical site.  No sexual intercourse or placement of anything in the vagina for 4 weeks or as instructed by your doctor. If you have tubes coming from the wound site, check with your doctor regarding appropriate care of the tubes. Only take prescription or over-the-counter medicines  for pain, discomfort, or fever as directed by your doctor. Do not take aspirin. It can make you bleed. Take medicines (antibiotics) that kill germs if they are prescribed for you.  Call the office or go to the MAU if:  You feel sick to your stomach (nauseous).  You start to throw up (vomit).  You have trouble eating or drinking.  You have an oral temperature above 101.  You have constipation that is not helped by adjusting diet or increasing fluid intake. Pain medicines are a common cause of constipation.  You have any other concerns. SEEK IMMEDIATE MEDICAL CARE IF:  You have persistent dizziness.  You have difficulty breathing or a congested sounding (croupy) cough.  You have an oral temperature above 102.5, not controlled by medicine.  There is increasing  pain or tenderness near or in the surgical site.   

## 2015-07-25 NOTE — Progress Notes (Signed)
Patient discharged to home as ordered. Patient has a follow up appointment scheduled with Dr. Valentino Saxon on August 11 at 1130 am. Patient is alert and oriented ambulates well without assistance. Dressing changed by Charge nurse prior to discharge. Prescriptions given as ordered.

## 2015-07-25 NOTE — Progress Notes (Signed)
1 Day Post-Op Procedure(s) (LRB): ATTEMPTED LAPAROSCOPY, ABDOMINAL BILATERAL SALPINGO OOPHORECTOMY  (Bilateral)  Subjective: Patient reports no problems voiding.  Denies complaints. Pain well controlled. Ambulating without difficulty.   Objective: I have reviewed patient's vital signs, intake and output, medications, labs and pathology.  Temp:  [97.8 F (36.6 C)-99.5 F (37.5 C)] 99.4 F (37.4 C) (08/02 0757) Pulse Rate:  [53-103] 79 (08/02 0757) Resp:  [6-22] 20 (08/02 0757) BP: (97-150)/(48-86) 139/71 mmHg (08/02 0757) SpO2:  [92 %-100 %] 100 % (08/02 0757) Weight:  [287 lb (130.182 kg)] 287 lb (130.182 kg) (08/01 1939)  General: alert and no distress Resp: clear to auscultation bilaterally Cardio: regular rate and rhythm, S1, S2 normal, no murmur, click, rub or gallop GI: soft, non-tender; bowel sounds normal; no masses,  no organomegaly Incision: Abdominal binder in place, bandage clean/dry/intact.  Extremities: extremities normal, atraumatic, no cyanosis or edema Vaginal Bleeding: none   Labs:  CBC Latest Ref Rng 07/25/2015 07/13/2015  WBC 3.6 - 11.0 K/uL 12.2(H) -  Hemoglobin 12.0 - 16.0 g/dL 10.8(L) 11.8(L)  Hematocrit 35.0 - 47.0 % 32.9(L) -  Platelets 150 - 440 K/uL 187 -     Assessment: s/p Procedure(s): ATTEMPTED LAPAROSCOPY, ABDOMINAL BILATERAL SALPINGO OOPHORECTOMY  (Bilateral): stable, progressing well and tolerating diet  Plan: Continue to advance diet Continue to encourage ambulation Continue PO medication Discontinue IV fluids Discharge home this afternoon.  Will need to f/u in 10 days for staple removal.      Hildred Laser 07/25/2015, 9:42 AM

## 2015-07-26 LAB — SURGICAL PATHOLOGY

## 2015-07-31 ENCOUNTER — Encounter: Payer: Self-pay | Admitting: Family Medicine

## 2015-08-03 ENCOUNTER — Ambulatory Visit (INDEPENDENT_AMBULATORY_CARE_PROVIDER_SITE_OTHER): Payer: 59 | Admitting: Obstetrics and Gynecology

## 2015-08-03 ENCOUNTER — Encounter: Payer: Self-pay | Admitting: Obstetrics and Gynecology

## 2015-08-03 VITALS — BP 131/83 | HR 71 | Ht 66.0 in | Wt 270.0 lb

## 2015-08-03 DIAGNOSIS — Z9889 Other specified postprocedural states: Secondary | ICD-10-CM

## 2015-08-03 DIAGNOSIS — Z6841 Body Mass Index (BMI) 40.0 and over, adult: Secondary | ICD-10-CM

## 2015-08-03 NOTE — Progress Notes (Signed)
Patient ID: Christine Michael, female   DOB: [birth year], 49 y.o.   MRN: [id number] 9 day post op and staple removal S/p laparotomy with bso- ovarian cyst  Bowel /bladder -normal No fevers No drainage from incision ibup as needed Percocet as needed

## 2015-08-03 NOTE — Progress Notes (Signed)
Subjective:     Christine Michael is a 49 y.o. G75P1001 female who presents to the clinic 1.5 weeks status post attempted laparoscopy, abdominal BSO for left adnexal mass. Eating a regular diet without difficulty. Bowel movements are normal. Pain is controlled with current analgesics. Medications being used: ibuprofen (OTC) and narcotic analgesics including Percocet.  The following portions of the patient's history were reviewed and updated as appropriate: allergies, current medications, past family history, past medical history, past social history, past surgical history and problem list.  Review of Systems Pertinent items are noted in HPI.    Objective:    BP 131/83 mmHg  Pulse 71  Ht  (1.676 m)  Wt 270 lb (122.471 kg)  BMI 43.60 kg/m2  LMP  (LMP Unknown) General:  alert and no distress  Abdomen: soft, bowel sounds active, non-tender, large pannus  Incision:   healing well, no drainage, no erythema, no hernia, no swelling, no dehiscence, incision well approximated. Small seroma under upper 1/5 of incision near umbilicus. Staples in place.        Pathology (07/24/2015):  A. Left ovary and fallopian tube:  - Endometriosis with endometriotic cyst (7.2 cm ovary).  Adhesions. Negative for malignancy. Left fallopian tube with no pathologic changes.   B. Right ovary and fallopian tube:   - Ovarian adhesions.  Ovarian surface epithelial inclusion glands.  Paratubal cyst.     Assessment:    Doing well postoperatively. Operative findings reviewed. Pathology report discussed.    Plan:    1. Continue any current medications. 2. Wound care discussed.Staples removed except near upper 1/5th of incision near umbilicus. Incision cleaned.  Steri-strips placed. Advised to wear abdominal binder for an additional week while active during the day.  3. Activity restrictions: no bending, stooping, or squatting, no lifting more than 15 pounds and pelvic rest 4. Anticipated return to work: 4  weeks. 5. Follow up: 1 week for remainder of staple removal.     Hildred Laser, MD [facility]

## 2015-08-08 ENCOUNTER — Ambulatory Visit (INDEPENDENT_AMBULATORY_CARE_PROVIDER_SITE_OTHER): Payer: 59 | Admitting: Family Medicine

## 2015-08-08 ENCOUNTER — Encounter: Payer: Self-pay | Admitting: Family Medicine

## 2015-08-08 VITALS — BP 132/80 | HR 86 | Temp 98.4°F | Resp 16 | Ht 66.0 in | Wt 269.1 lb

## 2015-08-08 DIAGNOSIS — Z0000 Encounter for general adult medical examination without abnormal findings: Secondary | ICD-10-CM | POA: Diagnosis not present

## 2015-08-08 DIAGNOSIS — M1990 Unspecified osteoarthritis, unspecified site: Secondary | ICD-10-CM | POA: Diagnosis not present

## 2015-08-08 HISTORY — DX: Unspecified osteoarthritis, unspecified site: M19.90

## 2015-08-08 NOTE — Patient Instructions (Signed)

## 2015-08-08 NOTE — Progress Notes (Signed)
Name: Christine Michael   MRN: [id number]    DOB: Jan 31, 1966   Date:08/08/2015       Progress Note  Subjective  Chief Complaint  Chief Complaint  Patient presents with  . Annual Exam    HPI  49 year old female presenting for annual H&P. Baseline problems remain stable.  Depression screen PHQ 2/9 08/08/2015  Decreased Interest 0  Down, Depressed, Hopeless 0  PHQ - 2 Score 0    Fall Risk  08/08/2015  Falls in the past year? No   Functional Status Survey: Is the patient deaf or have difficulty hearing?: No Does the patient have difficulty seeing, even when wearing glasses/contacts?: No Does the patient have difficulty concentrating, remembering, or making decisions?: No Does the patient have difficulty walking or climbing stairs?: No Does the patient have difficulty dressing or bathing?: No Does the patient have difficulty doing errands alone such as visiting a doctor's office or shopping?: No   Past Medical History  Diagnosis Date  . Hypertension   . Anxiety   . High cholesterol     Social History  Substance Use Topics  . Smoking status: Former Smoker    Quit date: 07/12/2008  . Smokeless tobacco: Never Used  . Alcohol Use: 0.0 - 0.6 oz/week    0-1 Cans of beer per week     Current outpatient prescriptions:  .  atorvastatin (LIPITOR) 20 MG tablet, Take 20 mg by mouth at bedtime. , Disp: , Rfl:  .  citalopram (CELEXA) 20 MG tablet, Take 1 tablet by mouth  daily (Patient taking differently: Take 1 tablet by mouth  daily am), Disp: 90 tablet, Rfl: 1 .  ferrous sulfate (FERROUSUL) 325 (65 FE) MG tablet, Take 1 tablet (325 mg total) by mouth daily with breakfast., Disp: 60 tablet, Rfl: 1 .  ibuprofen (ADVIL,MOTRIN) 800 MG tablet, Take 1 tablet (800 mg total) by mouth every 8 (eight) hours as needed., Disp: 30 tablet, Rfl: 1 .  oxyCODONE-acetaminophen (PERCOCET/ROXICET) 5-325 MG per tablet, Take 1-2 tablets by mouth every 6 (six) hours as needed for severe pain (moderate to  severe pain (when tolerating fluids))., Disp: 30 tablet, Rfl: 0 .  simethicone (MYLICON) 80 MG chewable tablet, Chew 1 tablet (80 mg total) by mouth 4 (four) times daily as needed for flatulence., Disp: 30 tablet, Rfl: 0 .  triamterene-hydrochlorothiazide (MAXZIDE-25) 37.5-25 MG per tablet, Take 1 tablet by mouth every morning. , Disp: , Rfl:   No Known Allergies  Review of Systems  Constitutional: Negative for fever, chills and weight loss.       Obesity  HENT: Negative for congestion, hearing loss, sore throat and tinnitus.   Eyes: Negative for blurred vision, double vision and redness.  Respiratory: Negative for cough, hemoptysis and shortness of breath.   Cardiovascular: Negative for chest pain, palpitations, orthopnea, claudication and leg swelling.  Gastrointestinal: Negative for heartburn, nausea, vomiting, diarrhea, constipation and blood in stool.  Genitourinary: Negative for dysuria, urgency, frequency and hematuria.  Musculoskeletal: Positive for joint pain. Negative for myalgias, back pain, falls and neck pain.  Skin: Negative for itching.  Neurological: Negative for dizziness, tingling, tremors, focal weakness, seizures, loss of consciousness, weakness and headaches.  Endo/Heme/Allergies: Does not bruise/bleed easily.  Psychiatric/Behavioral: Negative for depression and substance abuse. The patient is not nervous/anxious and does not have insomnia.      Objective  Filed Vitals:   08/08/15 1329  BP: 132/80  Pulse: 86  Temp: 98.4 F (36.9 C)  Resp: 16  Height: 5\' 6"  (1.676 m)  Weight: 269 lb 2 oz (122.074 kg)  SpO2: 98%     Physical Exam  Constitutional: She is oriented to person, place, and time and well-developed, well-nourished, and in no distress.  Morbidly obese  HENT:  Head: Normocephalic.  Eyes: EOM are normal. Pupils are equal, round, and reactive to light.  Neck: Normal range of motion. No thyromegaly present.  Cardiovascular: Normal rate, regular  rhythm and normal heart sounds.   No murmur heard. Pulmonary/Chest: Effort normal and breath sounds normal.  Breasts are large and pendulous and nontender  Abdominal: Soft. Bowel sounds are normal.  Musculoskeletal: Normal range of motion. She exhibits no edema.  Neurological: She is alert and oriented to person, place, and time. No cranial nerve deficit. Gait normal.  Skin: Skin is warm and dry. No rash noted.  Psychiatric: Memory and affect normal.      Assessment & Plan  1. Annual physical exam  - MM Digital Diagnostic Bilat; Future  2. Osteoarthritis, unspecified osteoarthritis type, unspecified site  - DG Bone Density; Future

## 2015-08-10 ENCOUNTER — Ambulatory Visit (INDEPENDENT_AMBULATORY_CARE_PROVIDER_SITE_OTHER): Payer: 59 | Admitting: Obstetrics and Gynecology

## 2015-08-10 ENCOUNTER — Encounter: Payer: Self-pay | Admitting: Obstetrics and Gynecology

## 2015-08-10 VITALS — BP 118/78 | HR 80 | Ht 66.0 in | Wt 269.2 lb

## 2015-08-10 DIAGNOSIS — E8941 Symptomatic postprocedural ovarian failure: Secondary | ICD-10-CM

## 2015-08-10 DIAGNOSIS — Z90722 Acquired absence of ovaries, bilateral: Secondary | ICD-10-CM

## 2015-08-10 DIAGNOSIS — N958 Other specified menopausal and perimenopausal disorders: Secondary | ICD-10-CM

## 2015-08-10 NOTE — Progress Notes (Signed)
Subjective:     Christine Michael is a 49 y.o. G74P1001 female who presents to the clinic 2 weeks status post attempted laparoscopy, abdominal BSO for left adnexal mass. Eating a regular diet without difficulty. Bowel movements are normal. Pain is controlled with current analgesics. Medications being used: ibuprofen (OTC).  The following portions of the patient's history were reviewed and updated as appropriate: allergies, current medications, past family history, past medical history, past social history, past surgical history and problem list.  Review of Systems A comprehensive review of systems was negative except for: Genitourinary: positive for hot flashes and mood changes (mild, occasional); incision site with foul odor. Denies fevers, chills.    Objective:    BP 118/78 mmHg  Pulse 80  Ht  (1.676 m)  Wt 269 lb 3.2 oz (122.108 kg)  BMI 43.47 kg/m2  LMP  (LMP Unknown) General:  alert and no distress  Abdomen: soft, bowel sounds active, non-tender, large pannus  Incision:   healing well, no drainage, no erythema, no hernia, no swelling, no dehiscence, incision well approximated. Small area of skin separation under upper 1/5 of incision near umbilicus with small amount of yellow discharge, no odor. Staples in place.         Assessment:    Doing well postoperatively.  Small area of skin separation near umbilicus.   Plan:    1. Continue any current medications. 2. Wound care discussed. Remaining staples removed at upper 1/5th of incision near umbilicus. Incision cleaned with hydrogen peroxide.  Steri-strips placed.  3. Activity restrictions: no bending, stooping, or squatting and no lifting more than 15 pounds x 1 week.  4. Anticipated return to work: at reduced duties and now. Is currently working from home.  5. Discussed menopausal symptoms, patient notes that they are currently mild, manageable.  Can f/u if symptoms worsen for medical management.  6. Follow up:  As  needed   Hildred Laser, MD [facility]

## 2015-09-11 ENCOUNTER — Ambulatory Visit
Admission: RE | Admit: 2015-09-11 | Discharge: 2015-09-11 | Disposition: A | Discharge status: Home / Self Care | Payer: 59 | Source: Ambulatory Visit | Attending: Family Medicine | Admitting: Family Medicine

## 2015-09-11 DIAGNOSIS — Z1231 Encounter for screening mammogram for malignant neoplasm of breast: Secondary | ICD-10-CM | POA: Insufficient documentation

## 2015-09-11 DIAGNOSIS — M1990 Unspecified osteoarthritis, unspecified site: Secondary | ICD-10-CM

## 2015-09-11 DIAGNOSIS — Z13820 Encounter for screening for osteoporosis: Secondary | ICD-10-CM | POA: Diagnosis present

## 2015-09-11 DIAGNOSIS — Z0000 Encounter for general adult medical examination without abnormal findings: Secondary | ICD-10-CM

## 2015-09-11 DIAGNOSIS — M8580 Other specified disorders of bone density and structure, unspecified site: Secondary | ICD-10-CM | POA: Diagnosis not present

## 2015-09-12 ENCOUNTER — Ambulatory Visit (INDEPENDENT_AMBULATORY_CARE_PROVIDER_SITE_OTHER): Payer: 59 | Admitting: Family Medicine

## 2015-09-12 ENCOUNTER — Ambulatory Visit: Payer: 59 | Admitting: Family Medicine

## 2015-09-12 ENCOUNTER — Encounter: Payer: Self-pay | Admitting: Family Medicine

## 2015-09-12 VITALS — BP 116/76 | HR 83 | Temp 98.2°F | Resp 16 | Ht 66.0 in | Wt 274.0 lb

## 2015-09-12 DIAGNOSIS — R739 Hyperglycemia, unspecified: Secondary | ICD-10-CM | POA: Diagnosis not present

## 2015-09-12 DIAGNOSIS — D62 Acute posthemorrhagic anemia: Secondary | ICD-10-CM

## 2015-09-12 DIAGNOSIS — I10 Essential (primary) hypertension: Secondary | ICD-10-CM | POA: Diagnosis not present

## 2015-09-12 DIAGNOSIS — E7800 Pure hypercholesterolemia, unspecified: Secondary | ICD-10-CM | POA: Insufficient documentation

## 2015-09-12 LAB — GLUCOSE, POCT (MANUAL RESULT ENTRY): POC Glucose: 80 mg/dl (ref 70–99)

## 2015-09-12 LAB — POCT GLYCOSYLATED HEMOGLOBIN (HGB A1C): Hemoglobin A1C: 5.6

## 2015-09-12 NOTE — Progress Notes (Signed)
Name: Christine Michael   MRN: [id number]    DOB: 1966/01/07   Date:09/12/2015       Progress Note  Subjective  Chief Complaint  Chief Complaint  Patient presents with  . Hyperglycemia    pt here for A1C and glucose and lab work    Hyperlipidemia This is a chronic problem. The problem is controlled. Recent lipid tests were reviewed and are normal. Pertinent negatives include no chest pain, leg pain, myalgias or shortness of breath. Current antihyperlipidemic treatment includes statins.  Anemia Presents for follow-up visit. There has been no abdominal pain or palpitations. Signs of blood loss that are not present include hematemesis, hematochezia, melena and vaginal bleeding. Past treatments include oral iron supplements.  patient wishes to check hemoglobin after pelvic surgery. She is currently on oral iron sulfate prescribed by her gynecologist.  Past Medical History  Diagnosis Date  . Hypertension   . Anxiety   . High cholesterol     Past Surgical History  Procedure Laterality Date  . Abdominal hysterectomy  2010    partial  . Colonoscopy  2015    [facility]  . Tubal ligation Bilateral 1988  . Tonsillectomy    . Salpingoophorectomy Bilateral 07/24/2015    Procedure: ATTEMPTED LAPAROSCOPY, ABDOMINAL BILATERAL SALPINGO OOPHORECTOMY ;  Surgeon: Hildred Laser, MD;  Location: [facility] ORS;  Service: Gynecology;  Laterality: Bilateral;    Family History  Problem Relation Age of Onset  . Cancer Mother   . Breast cancer Mother   . Alcohol abuse Father     Social History   Social History  . Marital Status: Divorced    Spouse Name: N/A  . Number of Children: N/A  . Years of Education: N/A   Occupational History  . Not on file.   Social History Main Topics  . Smoking status: Former Smoker    Quit date: 07/12/2008  . Smokeless tobacco: Never Used  . Alcohol Use: 0.0 - 0.6 oz/week    0-1 Cans of beer per week  . Drug Use: No  . Sexual Activity: Not Currently    Birth  Control/ Protection: Surgical   Other Topics Concern  . Not on file   Social History Narrative     Current outpatient prescriptions:  .  atorvastatin (LIPITOR) 20 MG tablet, Take 20 mg by mouth at bedtime. , Disp: , Rfl:  .  citalopram (CELEXA) 20 MG tablet, Take 1 tablet by mouth  daily (Patient taking differently: Take 1 tablet by mouth  daily am), Disp: 90 tablet, Rfl: 1 .  ferrous sulfate (FERROUSUL) 325 (65 FE) MG tablet, Take 1 tablet (325 mg total) by mouth daily with breakfast., Disp: 60 tablet, Rfl: 1 .  ibuprofen (ADVIL,MOTRIN) 800 MG tablet, Take 1 tablet (800 mg total) by mouth every 8 (eight) hours as needed., Disp: 30 tablet, Rfl: 1 .  triamterene-hydrochlorothiazide (MAXZIDE-25) 37.5-25 MG per tablet, Take 1 tablet by mouth every morning. , Disp: , Rfl:   No Known Allergies   Review of Systems  Respiratory: Negative for shortness of breath.   Cardiovascular: Negative for chest pain and palpitations.  Gastrointestinal: Negative for abdominal pain, blood in stool, melena, hematochezia and hematemesis.  Genitourinary: Negative for hematuria and vaginal bleeding.  Musculoskeletal: Negative for myalgias.    Objective  Filed Vitals:   09/12/15 0936  BP: 116/76  Pulse: 83  Temp: 98.2 F (36.8 C)  Resp: 16  Height:  (1.676 m)  Weight: 274 lb (124.286 kg)  SpO2: 98%    Physical Exam  Constitutional: She is oriented to person, place, and time and well-developed, well-nourished, and in no distress.  Cardiovascular: Normal rate and regular rhythm.   Pulmonary/Chest: Effort normal and breath sounds normal.  Abdominal: Soft. Bowel sounds are normal.  Musculoskeletal: She exhibits no edema.  Neurological: She is alert and oriented to person, place, and time.  Nursing note and vitals reviewed.  Assessment & Plan  1. Hyperglycemia A1c is at goal at 5.6%. Patient was reassured.  - POCT Glucose (CBG) - POCT HgB A1C  2. Essential hypertension  Blood  pressure is stable and controlled on present therapy  3. High cholesterol  Lipids are at goal on present statin therapy. Recheck liver enzymes today. - Lipid Profile - Comprehensive Metabolic Panel (CMET)  4. Postoperative anemia due to acute blood loss  Recheck hemoglobin and hematocrit and advised to follow up with gynecology regarding iron therapy. - CBC with Differential  Syed Asad [facility] 09/12/2015 10:12 AM

## 2015-09-13 LAB — LIPID PANEL
CHOL/HDL RATIO: 2.6 ratio (ref 0.0–4.4)
Cholesterol, Total: 160 mg/dL (ref 100–199)
HDL: 62 mg/dL (ref >39)
LDL Calculated: 83 mg/dL (ref 0–99)
Triglycerides: 73 mg/dL (ref 0–149)
VLDL Cholesterol Cal: 15 mg/dL (ref 5–40)

## 2015-09-13 LAB — CBC WITH DIFFERENTIAL/PLATELET
Basophils Absolute: 0 x10E3/uL (ref 0.0–0.2)
Basos: 0 %
EOS (ABSOLUTE): 0.3 x10E3/uL (ref 0.0–0.4)
Eos: 6 %
Hematocrit: 37 % (ref 34.0–46.6)
Hemoglobin: 11.7 g/dL (ref 11.1–15.9)
Immature Grans (Abs): 0 x10E3/uL (ref 0.0–0.1)
Immature Granulocytes: 0 %
Lymphocytes Absolute: 2.1 x10E3/uL (ref 0.7–3.1)
Lymphs: 36 %
MCH: 25.6 pg — ABNORMAL LOW (ref 26.6–33.0)
MCHC: 31.6 g/dL (ref 31.5–35.7)
MCV: 81 fL (ref 79–97)
Monocytes Absolute: 0.5 x10E3/uL (ref 0.1–0.9)
Monocytes: 9 %
Neutrophils Absolute: 2.9 x10E3/uL (ref 1.4–7.0)
Neutrophils: 49 %
Platelets: 256 x10E3/uL (ref 150–379)
RBC: 4.57 x10E6/uL (ref 3.77–5.28)
RDW: 15.4 % (ref 12.3–15.4)
WBC: 5.9 x10E3/uL (ref 3.4–10.8)

## 2015-09-13 LAB — COMPREHENSIVE METABOLIC PANEL WITH GFR
ALT: 20 IU/L (ref 0–32)
AST: 18 IU/L (ref 0–40)
Albumin/Globulin Ratio: 1.5 (ref 1.1–2.5)
Albumin: 4.4 g/dL (ref 3.5–5.5)
Alkaline Phosphatase: 61 IU/L (ref 39–117)
BUN/Creatinine Ratio: 15 (ref 9–23)
BUN: 11 mg/dL (ref 6–24)
Bilirubin Total: 0.4 mg/dL (ref 0.0–1.2)
CO2: 24 mmol/L (ref 18–29)
Calcium: 10.3 mg/dL — ABNORMAL HIGH (ref 8.7–10.2)
Chloride: 101 mmol/L (ref 97–108)
Creatinine, Ser: 0.71 mg/dL (ref 0.57–1.00)
GFR calc Af Amer: 114 mL/min/1.73
GFR calc non Af Amer: 99 mL/min/1.73
Globulin, Total: 3 g/dL (ref 1.5–4.5)
Glucose: 95 mg/dL (ref 65–99)
Potassium: 4.6 mmol/L (ref 3.5–5.2)
Sodium: 144 mmol/L (ref 134–144)
Total Protein: 7.4 g/dL (ref 6.0–8.5)

## 2015-09-14 ENCOUNTER — Telehealth: Payer: Self-pay | Admitting: Emergency Medicine

## 2015-09-14 NOTE — Telephone Encounter (Signed)
Patient notified of Bone Density results.  

## 2015-10-10 ENCOUNTER — Encounter: Payer: Self-pay | Admitting: Obstetrics and Gynecology

## 2015-10-10 ENCOUNTER — Ambulatory Visit (INDEPENDENT_AMBULATORY_CARE_PROVIDER_SITE_OTHER): Payer: 59 | Admitting: Obstetrics and Gynecology

## 2015-10-10 VITALS — BP 124/83 | HR 83 | Resp 16 | Ht 65.0 in | Wt 275.8 lb

## 2015-10-10 DIAGNOSIS — R109 Unspecified abdominal pain: Secondary | ICD-10-CM | POA: Diagnosis not present

## 2015-10-10 NOTE — Progress Notes (Signed)
GYNECOLOGY PROGRESS NOTE  Subjective:    Patient ID: Leanor RubensteinDenise K Croson, female    DOB: 11/30/1966, 49 y.o.   MRN: [id number]  HPI  Patient is a 49 y.o. 221P1001 female who presents for complaints of pain in right mid-abdomen for several days.  Pain is described a tugging, pulling, achy.  Aggravated by certain movements. Mildly tender to touch. Not associated with nausea/vomiting, chills.  Has not taken anything for pain.  Was concerned that it may be related to recent surgery (abdominal BSO in 07/2015 for large adnexal cyst, newly diagnosed endometriosis).  Denies constipation, diarrhea, urinary frequency, urgency, or discomfort.  The following portions of the patient's history were reviewed and updated as appropriate: allergies, current medications, past family history, past medical history, past social history, past surgical history and problem list.  Review of Systems A comprehensive review of systems was negative except for: Genitourinary: positive for hot flushes   Objective:   Blood pressure 124/83, pulse 83, resp. rate 16, height 5\' 5"  (1.651 m), weight 275 lb 12.8 oz (125.102 kg). General appearance: alert and no distress Abdomen: normal findings: bowel sounds normal, no masses palpable and soft, and no hernias present and abnormal findings:  mild tenderness in the upper abdomen (at level of umbilicus on right side) Pelvic: deferred Extremities: extremities normal, atraumatic, no cyanosis or edema Neurologic: Grossly normal   Assessment:   Abdominal pain (unspecified source)  Plan:   Patient with mild pain.  Advised on OTC pain relievers as needed, limiting aggregating pain meds.  If pain does not subside, may warrant further investigation with imaging. To f/u as needed, or if symptoms worsen/fail to improve.   Hildred LaserAnika Jeily Guthridge, MD [facility]

## 2015-10-29 ENCOUNTER — Other Ambulatory Visit: Payer: Self-pay | Admitting: Family Medicine

## 2015-11-09 ENCOUNTER — Encounter: Payer: Self-pay | Admitting: Family Medicine

## 2015-11-09 ENCOUNTER — Ambulatory Visit (INDEPENDENT_AMBULATORY_CARE_PROVIDER_SITE_OTHER): Payer: 59 | Admitting: Family Medicine

## 2015-11-09 ENCOUNTER — Other Ambulatory Visit: Payer: Self-pay | Admitting: Family Medicine

## 2015-11-09 VITALS — BP 122/70 | HR 97 | Temp 98.6°F | Resp 16 | Ht 65.0 in | Wt 275.3 lb

## 2015-11-09 DIAGNOSIS — L290 Pruritus ani: Secondary | ICD-10-CM

## 2015-11-09 DIAGNOSIS — E669 Obesity, unspecified: Secondary | ICD-10-CM | POA: Diagnosis not present

## 2015-11-09 DIAGNOSIS — I10 Essential (primary) hypertension: Secondary | ICD-10-CM

## 2015-11-09 DIAGNOSIS — D509 Iron deficiency anemia, unspecified: Secondary | ICD-10-CM | POA: Diagnosis not present

## 2015-11-09 MED ORDER — TRIAMTERENE-HCTZ 37.5-25 MG PO TABS: 1.0000 | ORAL_TABLET | Freq: Every day | ORAL | Status: DC

## 2015-11-09 MED ORDER — HYDROCORTISONE ACETATE 25 MG RE SUPP: 25.0000 mg | Freq: Two times a day (BID) | RECTAL | Status: DC

## 2015-11-09 NOTE — Progress Notes (Signed)
Name: Christine Michael   MRN: [id number]    DOB: 1966-07-24   Date:11/09/2015       Progress Note  Subjective  Chief Complaint  Chief Complaint  Patient presents with  . Hypertension    3 month follow up  . Depression  . Anemia    Had hysterectomy in August     HPI  Hypertension   Patient presents for follow-up of hypertension. It has been present for over 5 years.  Patient states that there is compliance with medical regimen which consists of Maxide 25 once daily . There is no end organ disease. Cardiac risk factors include hypertension hyperlipidemia and diabetes.  Exercise regimen consist of minimal .  Diet consist of salt restriction  Anemia  After hysterectomy patient was noted to have anemia. She's been placed on over-the-counter iron supplementation. . No transfusion was required. The anemia was diagnosed several months ago.  Pruritus ani  History of persistent itching and irritation in the perirectal and the distal rectal area internally. She does not have any palpable hemorrhoids. There is no problem with any melena or hematochezia rectal bleeding and on the toilet paper or constipation or diarrhea. She had a colonoscopy 9:15 which was normal.  Obesity  Patient has a long-standing history of obesity. She has never really following diet and exercise with any consistency. Since her last visit here her weight actually is within 1 pound of last visit. She has no history of diabetes her A1c is slightly elevated at 5.6 when checked on 6.    Past Medical History  Diagnosis Date  . Hypertension   . Anxiety   . High cholesterol     Social History  Substance Use Topics  . Smoking status: Former Smoker    Quit date: 07/12/2008  . Smokeless tobacco: Never Used  . Alcohol Use: 0.0 - 0.6 oz/week    0-1 Cans of beer per week     Current outpatient prescriptions:  .  atorvastatin (LIPITOR) 20 MG tablet, Take 20 mg by mouth at bedtime. , Disp: , Rfl:  .  citalopram  (CELEXA) 20 MG tablet, Take 1 tablet by mouth  daily, Disp: 90 tablet, Rfl: 1 .  ferrous sulfate (FERROUSUL) 325 (65 FE) MG tablet, Take 1 tablet (325 mg total) by mouth daily with breakfast., Disp: 60 tablet, Rfl: 1 .  triamterene-hydrochlorothiazide (MAXZIDE-25) 37.5-25 MG tablet, Take 1 tablet by mouth daily., Disp: 90 tablet, Rfl: 3  No Known Allergies  Review of Systems  Constitutional: Positive for malaise/fatigue. Negative for fever, chills and weight loss.  HENT: Negative for congestion, hearing loss, sore throat and tinnitus.   Eyes: Negative for blurred vision, double vision and redness.  Respiratory: Negative for cough, hemoptysis and shortness of breath.   Cardiovascular: Negative for chest pain, palpitations, orthopnea, claudication and leg swelling.  Gastrointestinal: Negative for heartburn, nausea, vomiting, diarrhea, constipation and blood in stool.       Perirectal itching and discomfort  Genitourinary: Negative for dysuria, urgency, frequency and hematuria.  Musculoskeletal: Positive for joint pain. Negative for myalgias, back pain, falls and neck pain.  Skin: Positive for itching.  Neurological: Negative for dizziness, tingling, tremors, focal weakness, seizures, loss of consciousness, weakness and headaches.  Endo/Heme/Allergies: Does not bruise/bleed easily.  Psychiatric/Behavioral: Negative for depression and substance abuse. The patient is not nervous/anxious and does not have insomnia.      Objective  Filed Vitals:   11/09/15 0917  BP: 122/70  Pulse: 97  Temp: 98.6  F (37 C)  TempSrc: Oral  Resp: 16  Height: 5\' 5"  (1.651 m)  Weight: 275 lb 4.8 oz (124.875 kg)  SpO2: 98%     Physical Exam  Constitutional: She is oriented to person, place, and time.  Obesity acute distress  HENT:  Head: Normocephalic.  Eyes: EOM are normal. Pupils are equal, round, and reactive to light.  Neck: Normal range of motion. No thyromegaly present.  Cardiovascular: Normal  rate, regular rhythm and normal heart sounds.   No murmur heard. Pulmonary/Chest: Effort normal and breath sounds normal.  Abdominal: Soft. Bowel sounds are normal.  Musculoskeletal: Normal range of motion. She exhibits no edema.  Neurological: She is alert and oriented to person, place, and time. No cranial nerve deficit. Gait normal.  Skin: Skin is warm and dry. No rash noted.  Psychiatric: Memory and affect normal.      Assessment & Plan  1. Essential hypertension Well-controlled - CBC  2. Anemia, iron deficiency Check CBC and iron ferritin - Ferritin - Iron Binding Cap (TIBC)  3. Obesity Encourage diet and exercise - CBC  4. Pruritus ani Avoid spicy highly seasoned food and will use topical and rectal steroids in the anal area - hydrocortisone (ANUSOL-HC) 25 MG suppository; Place 1 suppository (25 mg total) rectally 2 (two) times daily.  Dispense: 20 suppository; Refill: 2

## 2015-11-10 ENCOUNTER — Other Ambulatory Visit: Payer: Self-pay | Admitting: Family Medicine

## 2015-11-10 LAB — CBC
HEMATOCRIT: 38.7 % (ref 34.0–46.6)
Hemoglobin: 12.6 g/dL (ref 11.1–15.9)
MCH: 25.4 pg — ABNORMAL LOW (ref 26.6–33.0)
MCHC: 32.6 g/dL (ref 31.5–35.7)
MCV: 78 fL — ABNORMAL LOW (ref 79–97)
Platelets: 236 10*3/uL (ref 150–379)
RBC: 4.97 x10E6/uL (ref 3.77–5.28)
RDW: 15.7 % — ABNORMAL HIGH (ref 12.3–15.4)
WBC: 5.6 10*3/uL (ref 3.4–10.8)

## 2015-11-10 LAB — IRON AND TIBC
IRON: 62 ug/dL (ref 27–159)
Iron Saturation: 19 % (ref 15–55)
TIBC: 335 ug/dL (ref 250–450)
UIBC: 273 ug/dL (ref 131–425)

## 2015-11-10 LAB — FERRITIN: Ferritin: 80 ng/mL (ref 15–150)

## 2016-03-11 ENCOUNTER — Ambulatory Visit: Payer: 59 | Admitting: Family Medicine

## 2016-04-24 ENCOUNTER — Ambulatory Visit: Payer: 59 | Admitting: Family Medicine

## 2016-06-10 ENCOUNTER — Encounter: Payer: Self-pay | Admitting: Family Medicine

## 2016-06-10 ENCOUNTER — Ambulatory Visit (INDEPENDENT_AMBULATORY_CARE_PROVIDER_SITE_OTHER): Payer: 59 | Admitting: Family Medicine

## 2016-06-10 VITALS — BP 119/72 | HR 91 | Temp 98.6°F | Resp 17 | Ht 65.0 in | Wt 299.5 lb

## 2016-06-10 DIAGNOSIS — H6122 Impacted cerumen, left ear: Secondary | ICD-10-CM | POA: Diagnosis not present

## 2016-06-10 DIAGNOSIS — H8111 Benign paroxysmal vertigo, right ear: Secondary | ICD-10-CM | POA: Diagnosis not present

## 2016-06-10 DIAGNOSIS — H8110 Benign paroxysmal vertigo, unspecified ear: Secondary | ICD-10-CM | POA: Insufficient documentation

## 2016-06-10 NOTE — Progress Notes (Signed)
Name: Christine RubensteinDenise K Jovel   MRN: [id number]    DOB: 11/19/1966   Date:06/10/2016       Progress Note  Subjective  Chief Complaint  Chief Complaint  Patient presents with  . Dizziness    Dizziness This is a new problem. The current episode started in the past 7 days (5 days ago). The problem has been unchanged. Associated symptoms include congestion (Sinus pressure) and nausea (had it on day 1of symptoms, now resolved.). Pertinent negatives include no chills, fatigue, fever, sore throat, vertigo or vomiting. The symptoms are aggravated by bending (mainly position changes such as with sitting up from lying down position, rolling over in bed, ). She has tried position changes (Was prescribed Meclizine and Flonase, started taking it three days ago, no appreciable relief yet.) for the symptoms.   Seen at the current clinic urgent care, started on meclizine and Flonase for relief of symptoms associated with ETD  Past Medical History  Diagnosis Date  . Hypertension   . Anxiety   . High cholesterol     Past Surgical History  Procedure Laterality Date  . Abdominal hysterectomy  2010    partial  . [facility]  . Tubal ligation Bilateral 1988  . Tonsillectomy    . Salpingoophorectomy Bilateral 07/24/2015    Procedure: ATTEMPTED LAPAROSCOPY, ABDOMINAL BILATERAL SALPINGO OOPHORECTOMY ;  Surgeon: Hildred LaserAnika Cherry, MD;  Location: [facility] ORS;  Service: Gynecology;  Laterality: Bilateral;    Family History  Problem Relation Age of Onset  . Cancer Mother   . Breast cancer Mother   . Alcohol abuse Father     Social History   Social History  . Marital Status: Divorced    Spouse Name: N/A  . Number of Children: N/A  . Years of Education: N/A   Occupational History  . Not on file.   Social History Main Topics  . Smoking status: Former Smoker    Quit date: 07/12/2008  . Smokeless tobacco: Never Used  . Alcohol Use: 0.0 - 0.6 oz/week    0-1 Cans of beer per week  . Drug  Use: No  . Sexual Activity: Not Currently    Birth Control/ Protection: Surgical   Other Topics Concern  . Not on file   Social History Narrative     Current outpatient prescriptions:  .  atorvastatin (LIPITOR) 20 MG tablet, Take 1 tablet by mouth at  bedtime, Disp: 90 tablet, Rfl: 1 .  citalopram (CELEXA) 20 MG tablet, Take 1 tablet by mouth  daily, Disp: 90 tablet, Rfl: 1 .  fluticasone (FLONASE) 50 MCG/ACT nasal spray, instill 2 sprays into each nostril once daily, Disp: , Rfl: 0 .  meclizine (ANTIVERT) 25 MG tablet, Take 1 tablet by mouth 3 (three) times daily as needed., Disp: , Rfl: 0 .  triamterene-hydrochlorothiazide (MAXZIDE-25) 37.5-25 MG tablet, Take 1 tablet by mouth  daily, Disp: 90 tablet, Rfl: 1 .  triamterene-hydrochlorothiazide (MAXZIDE-25) 37.5-25 MG tablet, Take 1 tablet by mouth daily., Disp: 90 tablet, Rfl: 3  No Known Allergies   Review of Systems  Constitutional: Negative for fever, chills and fatigue.  HENT: Positive for congestion (Sinus pressure). Negative for sore throat.   Eyes: Negative for blurred vision and double vision.  Gastrointestinal: Positive for nausea (had it on day 1of symptoms, now resolved.). Negative for vomiting.  Neurological: Positive for dizziness. Negative for vertigo.    Objective  Filed Vitals:   06/10/16 1413  BP: 119/72  Pulse:  91  Temp: 98.6 F (37 C)  TempSrc: Oral  Resp: 17  Height:  (1.651 m)  Weight: 299 lb 8 oz (135.852 kg)  SpO2: 98%    Physical Exam  Constitutional: She is well-developed, well-nourished, and in no distress.  HENT:  Right Ear: There is tenderness. No drainage or swelling.  Left Ear: No drainage or swelling.  Nose: Right sinus exhibits no maxillary sinus tenderness and no frontal sinus tenderness. Left sinus exhibits no maxillary sinus tenderness and no frontal sinus tenderness.  Mouth/Throat: No oropharyngeal exudate, posterior oropharyngeal edema or posterior oropharyngeal erythema.   Left ear canal normal, cerumen impaction, TM not visualized.  Right ear canal normal, no drainage, non erythematous. Significant patient discomfort with Otoscopic exam, TM not visualized.    Cardiovascular: Normal rate, regular rhythm, S1 normal, S2 normal and normal heart sounds.   No murmur heard. Pulmonary/Chest: Effort normal and breath sounds normal. She has no wheezes. She has no rhonchi.  Nursing note and vitals reviewed.      Assessment & Plan  1. BPPV (benign paroxysmal positional vertigo), right Symptoms consistent with BPPV based on history and exam and a partial response to meclizine. Referral to ENT for consideration of Epley's maneuver - Ambulatory referral to ENT  Yuleidy Rappleye Asad A[facility] 06/10/2016 2:40 PM

## 2016-06-19 ENCOUNTER — Other Ambulatory Visit: Payer: Self-pay | Admitting: Family Medicine

## 2016-06-29 ENCOUNTER — Other Ambulatory Visit: Payer: Self-pay | Admitting: Family Medicine

## 2016-07-05 ENCOUNTER — Telehealth: Payer: Self-pay | Admitting: Family Medicine

## 2016-07-05 DIAGNOSIS — E7800 Pure hypercholesterolemia, unspecified: Secondary | ICD-10-CM

## 2016-07-05 DIAGNOSIS — I10 Essential (primary) hypertension: Secondary | ICD-10-CM

## 2016-07-05 MED ORDER — ATORVASTATIN CALCIUM 20 MG PO TABS: 20.0000 mg | ORAL_TABLET | Freq: Every day | ORAL | Status: DC

## 2016-07-05 MED ORDER — TRIAMTERENE-HCTZ 37.5-25 MG PO TABS: 1.0000 | ORAL_TABLET | Freq: Every day | ORAL | Status: DC

## 2016-07-05 NOTE — Telephone Encounter (Signed)
Prescription for atorvastatin and triamterene HCTZ sent to pharmacy

## 2016-07-05 NOTE — Telephone Encounter (Signed)
needs refill on ATORVASTATIN, TRIAMT/ HCTZ. [facility] SAYS THAT THEY HAVE SENT 2 REQUEST WITH NO RESPONSE.

## 2016-07-29 ENCOUNTER — Ambulatory Visit (INDEPENDENT_AMBULATORY_CARE_PROVIDER_SITE_OTHER): Payer: 59 | Admitting: Family Medicine

## 2016-07-29 ENCOUNTER — Encounter: Payer: Self-pay | Admitting: Family Medicine

## 2016-07-29 ENCOUNTER — Other Ambulatory Visit: Payer: Self-pay | Admitting: Family Medicine

## 2016-07-29 DIAGNOSIS — Z803 Family history of malignant neoplasm of breast: Secondary | ICD-10-CM | POA: Insufficient documentation

## 2016-07-29 DIAGNOSIS — Z01419 Encounter for gynecological examination (general) (routine) without abnormal findings: Secondary | ICD-10-CM | POA: Diagnosis not present

## 2016-07-29 HISTORY — DX: Family history of malignant neoplasm of breast: Z80.3

## 2016-07-29 NOTE — Progress Notes (Signed)
Name: Christine Michael   MRN: [id number]    DOB: January 03, 1966   Date:07/29/2016       Progress Note  Subjective  Chief Complaint  Chief Complaint  Patient presents with  . Annual Exam    CPE w/ pap    HPI  Pt. Presents for annual physical exam. Last Pap Smear was several years ago,  Last colonoscopy was 2 years ago and was normal. Mammogram was last year and was normal.   Past Medical History:  Diagnosis Date  . Anxiety   . High cholesterol   . Hypertension     Past Surgical History:  Procedure Laterality Date  . ABDOMINAL HYSTERECTOMY  2010   partial  . COLONOSCOPY  2015   [facility]  . SALPINGOOPHORECTOMY Bilateral 07/24/2015   Procedure: ATTEMPTED LAPAROSCOPY, ABDOMINAL BILATERAL SALPINGO OOPHORECTOMY ;  Surgeon: Hildred Laser, MD;  Location: [facility] ORS;  Service: Gynecology;  Laterality: Bilateral;  . TONSILLECTOMY    . TUBAL LIGATION Bilateral 1988    Family History  Problem Relation Age of Onset  . Cancer Mother   . Breast cancer Mother   . Alcohol abuse Father     Social History   Social History  . Marital status: Divorced    Spouse name: N/A  . Number of children: N/A  . Years of education: N/A   Occupational History  . Not on file.   Social History Main Topics  . Smoking status: Former Smoker    Quit date: 07/12/2008  . Smokeless tobacco: Never Used  . Alcohol use 0.0 - 0.6 oz/week  . Drug use: No  . Sexual activity: Not Currently    Birth control/ protection: Surgical   Other Topics Concern  . Not on file   Social History Narrative  . No narrative on file     Current Outpatient Prescriptions:  .  atorvastatin (LIPITOR) 20 MG tablet, Take 1 tablet (20 mg total) by mouth daily at 6 PM., Disp: 90 tablet, Rfl: 0 .  citalopram (CELEXA) 20 MG tablet, Take 1 tablet by mouth  daily, Disp: 90 tablet, Rfl: 1 .  triamterene-hydrochlorothiazide (MAXZIDE-25) 37.5-25 MG tablet, Take 1 tablet by mouth daily., Disp: 90 tablet, Rfl: 0 .  fluticasone  (FLONASE) 50 MCG/ACT nasal spray, instill 2 sprays into each nostril once daily, Disp: , Rfl: 0 .  meclizine (ANTIVERT) 25 MG tablet, Take 1 tablet by mouth 3 (three) times daily as needed., Disp: , Rfl: 0  No Known Allergies   Review of Systems  Constitutional: Negative for chills, fever, malaise/fatigue and weight loss.  HENT: Negative for congestion.   Respiratory: Negative for cough and shortness of breath.   Cardiovascular: Negative for chest pain and leg swelling.  Gastrointestinal: Negative for abdominal pain, blood in stool, constipation, diarrhea, heartburn, nausea and vomiting.  Genitourinary: Negative for dysuria and urgency.  Musculoskeletal: Positive for joint pain (left knee arthritis, received a corticosteroid shot ). Negative for myalgias.  Neurological: Negative for dizziness and headaches.  Psychiatric/Behavioral: Negative for depression. The patient is not nervous/anxious and does not have insomnia.      Objective  Vitals:   07/29/16 0907  BP: 118/70  Pulse: 99  Resp: 15  Temp: 98.9 F (37.2 C)  TempSrc: Oral  SpO2: 99%  Weight: 294 lb 12.8 oz (133.7 kg)  Height:  (1.651 m)    Physical Exam  Constitutional: She is oriented to person, place, and time and well-developed, well-nourished, and in no distress.  HENT:  Head: Normocephalic and atraumatic.  Right Ear: Tympanic membrane and ear canal normal.  Left ear cerumen impaction, TM not visualized.  Cardiovascular: Normal rate, regular rhythm and normal heart sounds.   No murmur heard. Pulmonary/Chest: Effort normal and breath sounds normal. No respiratory distress. She has no wheezes. She exhibits no mass. Right breast exhibits no inverted nipple, no mass, no nipple discharge, no skin change and no tenderness. Left breast exhibits no inverted nipple, no mass, no nipple discharge, no skin change and no tenderness. Breasts are symmetrical.  Abdominal: Soft. Bowel sounds are normal. There is no  tenderness.  Genitourinary: Vagina normal and cervix normal. Cervix exhibits no motion tenderness, no lesion and no tenderness. Vagina exhibits no lesion. No vaginal discharge found.  Neurological: She is alert and oriented to person, place, and time.  Psychiatric: Mood, memory, affect and judgment normal.  Nursing note and vitals reviewed.      Assessment & Plan  1. Well woman exam with routine gynecological exam   Routine screening age appropriate lab work  - CBC with Differential - Comprehensive Metabolic Panel (CMET) - Lipid Profile - Cytology - PAP - TSH - Vitamin D (25 hydroxy)  2. Family history of breast cancer in mother  - MM Digital Screening; Future   Avedis Bevis Asad A. [facility]lene [facility] 07/29/2016 9:20 AM

## 2016-07-30 LAB — COMPREHENSIVE METABOLIC PANEL
ALT: 24 IU/L (ref 0–32)
AST: 23 IU/L (ref 0–40)
Albumin/Globulin Ratio: 1.5 (ref 1.2–2.2)
Albumin: 4.4 g/dL (ref 3.5–5.5)
Alkaline Phosphatase: 73 IU/L (ref 39–117)
BUN/Creatinine Ratio: 16 (ref 9–23)
BUN: 11 mg/dL (ref 6–24)
Bilirubin Total: 0.5 mg/dL (ref 0.0–1.2)
CALCIUM: 9.5 mg/dL (ref 8.7–10.2)
CO2: 26 mmol/L (ref 18–29)
CREATININE: 0.67 mg/dL (ref 0.57–1.00)
Chloride: 97 mmol/L (ref 96–106)
GFR, EST AFRICAN AMERICAN: 117 mL/min/{1.73_m2} (ref >59)
GFR, EST NON AFRICAN AMERICAN: 101 mL/min/{1.73_m2} (ref >59)
GLOBULIN, TOTAL: 2.9 g/dL (ref 1.5–4.5)
Glucose: 90 mg/dL (ref 65–99)
Potassium: 3.9 mmol/L (ref 3.5–5.2)
Sodium: 141 mmol/L (ref 134–144)
TOTAL PROTEIN: 7.3 g/dL (ref 6.0–8.5)

## 2016-07-30 LAB — CBC WITH DIFFERENTIAL/PLATELET
BASOS: 1 %
Basophils Absolute: 0 10*3/uL (ref 0.0–0.2)
EOS (ABSOLUTE): 0.7 10*3/uL — ABNORMAL HIGH (ref 0.0–0.4)
EOS: 11 %
HEMATOCRIT: 39.7 % (ref 34.0–46.6)
Hemoglobin: 12.5 g/dL (ref 11.1–15.9)
IMMATURE GRANS (ABS): 0 10*3/uL (ref 0.0–0.1)
IMMATURE GRANULOCYTES: 0 %
LYMPHS: 29 %
Lymphocytes Absolute: 1.9 10*3/uL (ref 0.7–3.1)
MCH: 25 pg — ABNORMAL LOW (ref 26.6–33.0)
MCHC: 31.5 g/dL (ref 31.5–35.7)
MCV: 79 fL (ref 79–97)
MONOS ABS: 0.5 10*3/uL (ref 0.1–0.9)
Monocytes: 8 %
NEUTROS PCT: 51 %
Neutrophils Absolute: 3.4 10*3/uL (ref 1.4–7.0)
PLATELETS: 221 10*3/uL (ref 150–379)
RBC: 5 x10E6/uL (ref 3.77–5.28)
RDW: 15.5 % — AB (ref 12.3–15.4)
WBC: 6.6 10*3/uL (ref 3.4–10.8)

## 2016-07-30 LAB — LIPID PANEL
CHOL/HDL RATIO: 2.9 ratio (ref 0.0–4.4)
Cholesterol, Total: 169 mg/dL (ref 100–199)
HDL: 58 mg/dL (ref >39)
LDL CALC: 94 mg/dL (ref 0–99)
TRIGLYCERIDES: 85 mg/dL (ref 0–149)
VLDL Cholesterol Cal: 17 mg/dL (ref 5–40)

## 2016-07-30 LAB — TSH: TSH: 1.11 u[IU]/mL (ref 0.450–4.500)

## 2016-07-30 LAB — VITAMIN D 25 HYDROXY (VIT D DEFICIENCY, FRACTURES): Vit D, 25-Hydroxy: 36.2 ng/mL (ref 30.0–100.0)

## 2016-08-01 LAB — PAP LB, RFX HPV ASCU: PAP SMEAR COMMENT: 0

## 2016-08-06 ENCOUNTER — Ambulatory Visit (INDEPENDENT_AMBULATORY_CARE_PROVIDER_SITE_OTHER): Payer: 59 | Admitting: Family Medicine

## 2016-08-06 ENCOUNTER — Encounter: Payer: Self-pay | Admitting: Family Medicine

## 2016-08-06 VITALS — BP 124/72 | HR 88 | Temp 98.9°F | Resp 16 | Wt 294.0 lb

## 2016-08-06 DIAGNOSIS — H6122 Impacted cerumen, left ear: Secondary | ICD-10-CM

## 2016-08-06 NOTE — Progress Notes (Signed)
Name: Christine Michael   MRN: [id number]    DOB: 03/22/1966   Date:08/06/2016       Progress Note  Subjective  Chief Complaint  Chief Complaint  Patient presents with  . Cerumen Impaction    HPI  Cerumen Impaction:  Pt. Presents for cerumen removal, which was identified during her physical exam appointment last week. Pt. Denies feeling any difficulty hearing, pain or pressure sensation in her ears. SHe does not use Q-tips to clean her ears.   Past Medical History:  Diagnosis Date  . Anxiety   . High cholesterol   . Hypertension     Past Surgical History:  Procedure Laterality Date  . ABDOMINAL HYSTERECTOMY  2010   partial  . COLONOSCOPY  2015   [facility]  . SALPINGOOPHORECTOMY Bilateral 07/24/2015   Procedure: ATTEMPTED LAPAROSCOPY, ABDOMINAL BILATERAL SALPINGO OOPHORECTOMY ;  Surgeon: Hildred LaserAnika Cherry, MD;  Location: [facility] OR[facility];  Service: Gynecology;  Laterality: Bilateral;  . TONSILLECTOMY    . TUBAL LIGATION Bilateral 1988    Family History  Problem Relation Age of Onset  . Cancer Mother   . Breast cancer Mother   . Alcohol abuse Father     Social History   Social History  . Marital status: Divorced    Spouse name: N/A  . Number of children: N/A  . Years of education: N/A   Occupational History  . Not on file.   Social History Main Topics  . Smoking status: Former Smoker    Quit date: 07/12/2008  . Smokeless tobacco: Never Used  . Alcohol use 0.0 - 0.6 oz/week  . Drug use: No  . Sexual activity: Not Currently    Birth control/ protection: Surgical   Other Topics Concern  . Not on file   Social History Narrative  . No narrative on file     Current Outpatient Prescriptions:  .  atorvastatin (LIPITOR) 20 MG tablet, Take 1 tablet (20 mg total) by mouth daily at 6 PM., Disp: 90 tablet, Rfl: 0 .  citalopram (CELEXA) 20 MG tablet, Take 1 tablet by mouth  daily, Disp: 90 tablet, Rfl: 1 .  fluticasone (FLONASE) 50 MCG/ACT nasal spray, instill 2 sprays into  each nostril once daily, Disp: , Rfl: 0 .  meclizine (ANTIVERT) 25 MG tablet, Take 1 tablet by mouth 3 (three) times daily as needed., Disp: , Rfl: 0 .  triamterene-hydrochlorothiazide (MAXZIDE-25) 37.5-25 MG tablet, Take 1 tablet by mouth daily., Disp: 90 tablet, Rfl: 0  No Known Allergies   Review of Systems  Constitutional: Negative for fever.  HENT: Negative for ear discharge and hearing loss.   Neurological: Negative for headaches.      Objective  Vitals:   08/06/16 1018  BP: 124/72  Pulse: 88  Resp: 16  Temp: 98.9 F (37.2 C)  TempSrc: Oral  SpO2: 97%  Weight: 294 lb (133.4 kg)    Physical Exam  Constitutional: She is oriented to person, place, and time and well-developed, well-nourished, and in no distress.  HENT:  Right Ear: Tympanic membrane and ear canal normal.  Left Ear: Tympanic membrane and ear canal normal.  Neurological: She is alert and oriented to person, place, and time.  Nursing note and vitals reviewed.    Assessment & Plan  1. Impacted ear wax, left Successfully removed cerumen from left ear - Ear Lavage   Christine Michael Christine A. [facility]lene [facility] 08/06/2016 10:36 AM

## 2016-08-28 ENCOUNTER — Other Ambulatory Visit: Payer: Self-pay | Admitting: Family Medicine

## 2016-08-28 DIAGNOSIS — E7800 Pure hypercholesterolemia, unspecified: Secondary | ICD-10-CM

## 2016-08-28 DIAGNOSIS — I10 Essential (primary) hypertension: Secondary | ICD-10-CM

## 2016-08-29 ENCOUNTER — Telehealth: Payer: Self-pay | Admitting: Family Medicine

## 2016-08-29 ENCOUNTER — Other Ambulatory Visit: Payer: Self-pay | Admitting: Family Medicine

## 2016-08-29 DIAGNOSIS — I10 Essential (primary) hypertension: Secondary | ICD-10-CM

## 2016-08-29 DIAGNOSIS — E7800 Pure hypercholesterolemia, unspecified: Secondary | ICD-10-CM

## 2016-08-29 MED ORDER — CITALOPRAM HYDROBROMIDE 20 MG PO TABS: 20.0000 mg | ORAL_TABLET | Freq: Every day | ORAL | 1 refills | Status: DC

## 2016-08-29 MED ORDER — ATORVASTATIN CALCIUM 20 MG PO TABS: ORAL_TABLET | ORAL | 1 refills | Status: DC

## 2016-08-29 MED ORDER — TRIAMTERENE-HCTZ 37.5-25 MG PO TABS: 1.0000 | ORAL_TABLET | Freq: Every day | ORAL | 1 refills | Status: DC

## 2016-08-29 NOTE — Telephone Encounter (Signed)
Prescriptions for atorvastatin, citalopram, and triamterene HCTZ are sent to her pharmacy

## 2016-08-29 NOTE — Telephone Encounter (Signed)
Pt.notified

## 2016-09-11 ENCOUNTER — Other Ambulatory Visit: Payer: Self-pay | Admitting: Family Medicine

## 2016-09-11 ENCOUNTER — Ambulatory Visit
Admission: RE | Admit: 2016-09-11 | Discharge: 2016-09-11 | Disposition: A | Discharge status: Home / Self Care | Payer: 59 | Source: Ambulatory Visit | Attending: Family Medicine | Admitting: Family Medicine

## 2016-09-11 DIAGNOSIS — Z803 Family history of malignant neoplasm of breast: Secondary | ICD-10-CM

## 2016-09-11 DIAGNOSIS — Z1231 Encounter for screening mammogram for malignant neoplasm of breast: Secondary | ICD-10-CM | POA: Diagnosis not present

## 2016-09-17 ENCOUNTER — Ambulatory Visit: Payer: 59 | Admitting: Family Medicine

## 2016-11-29 IMAGING — MG MM DIGITAL SCREENING BILAT W/ TOMO W/ CAD
8 of 13 series · 8 of 29 positions shown · non-contrast
Comparison: Previous exam(s).

CLINICAL DATA: Screening.

EXAM:
2D DIGITAL SCREENING BILATERAL MAMMOGRAM WITH CAD AND ADJUNCT TOMO

[R CV]
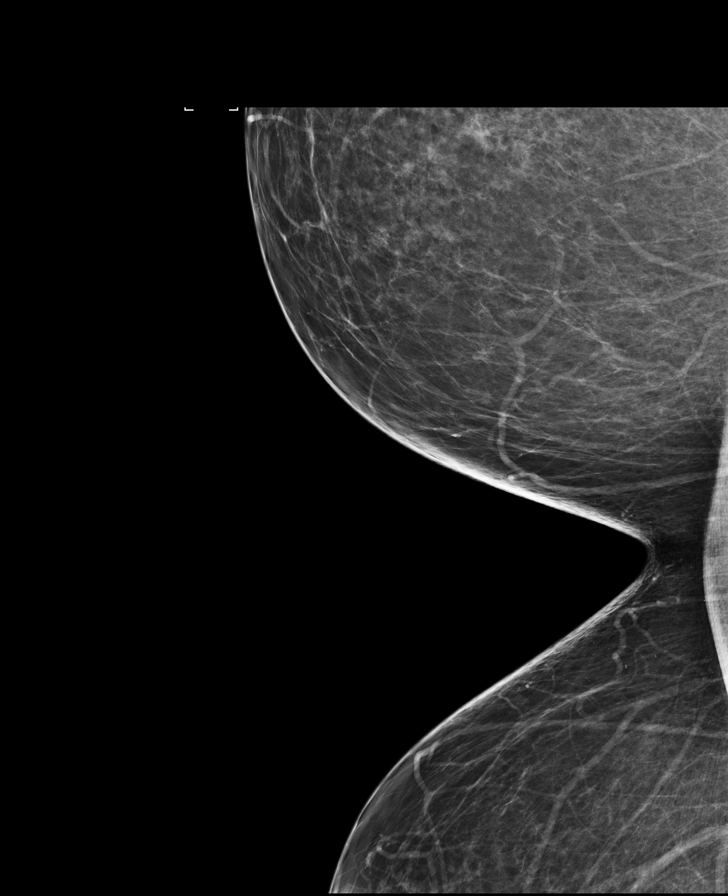

[L CC synth-2D]
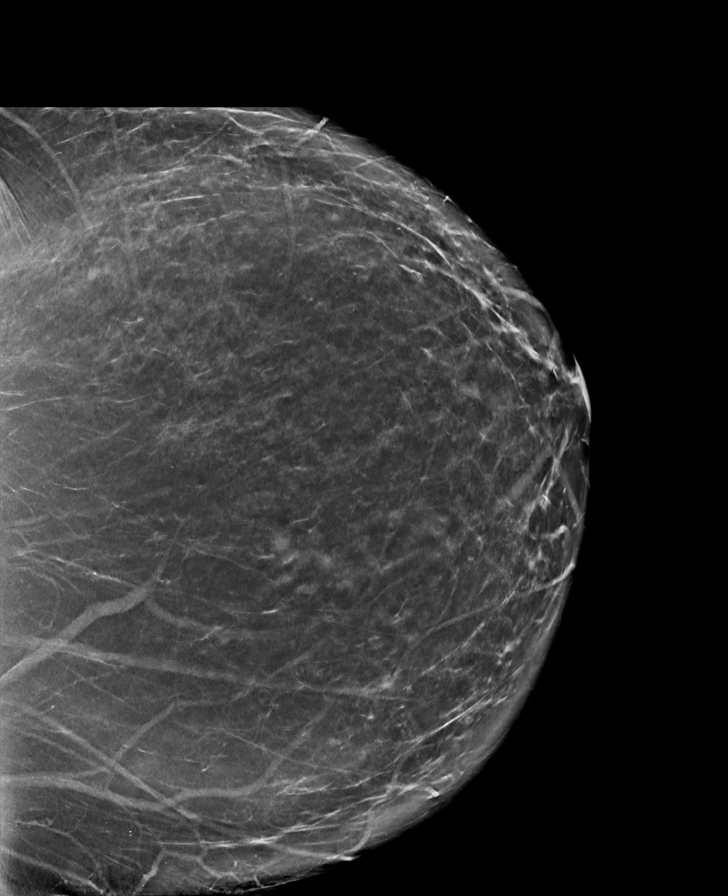

[R MLO]
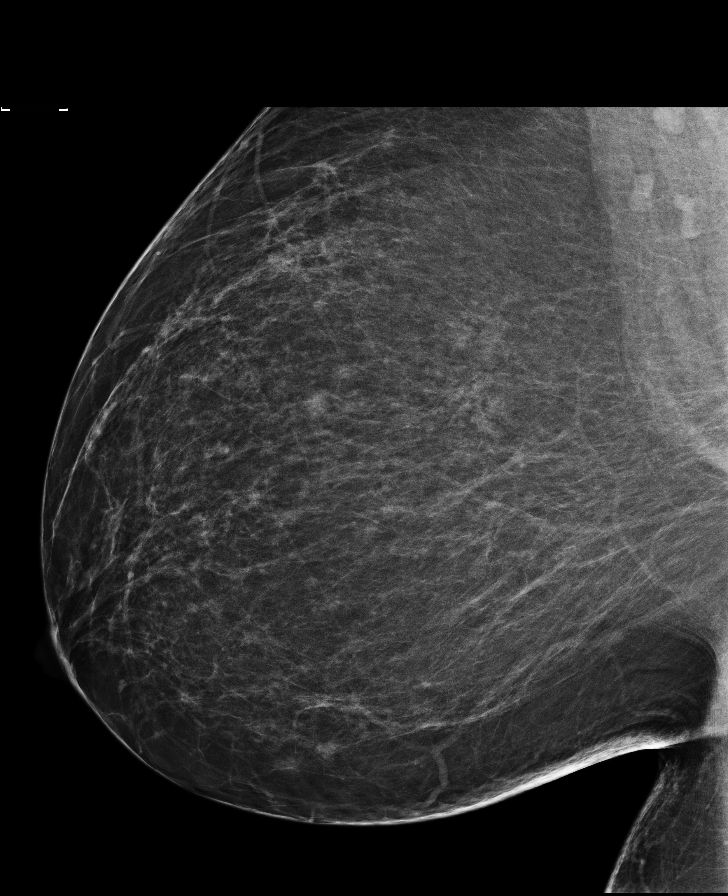

[L CC]
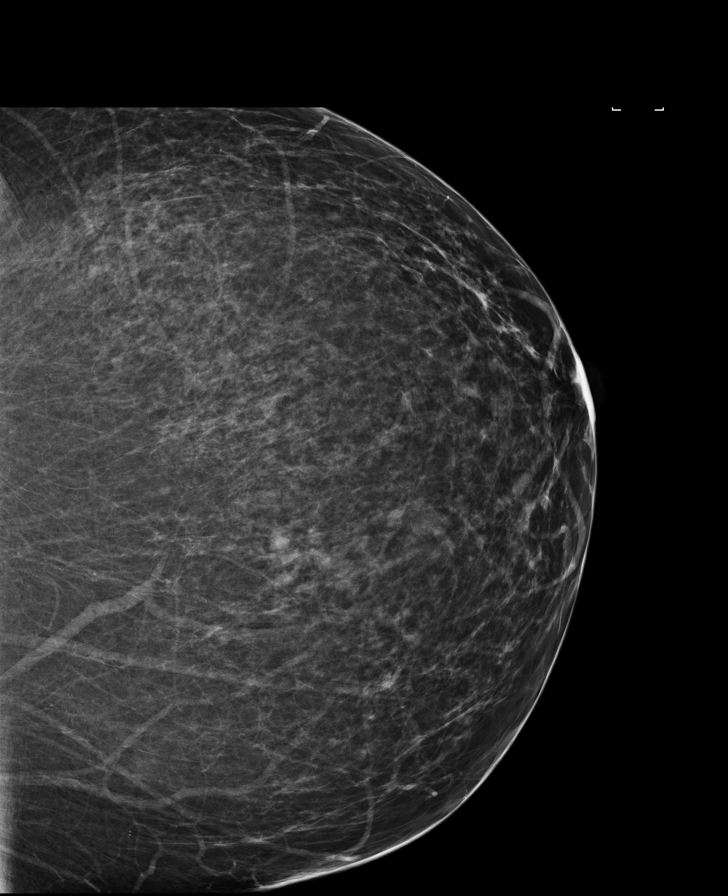

[R CC]
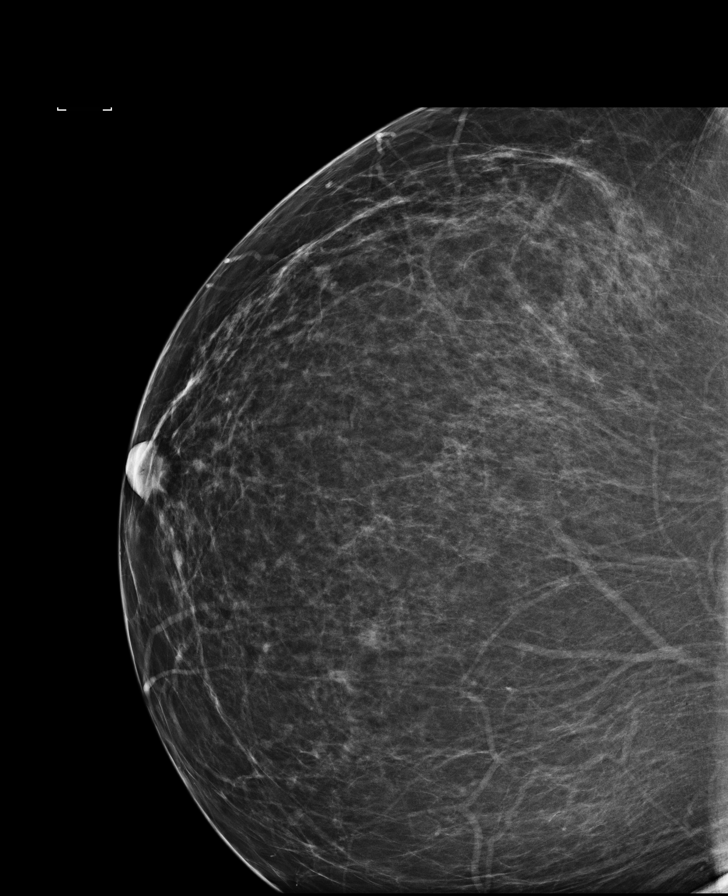

[L MLO synth-2D]
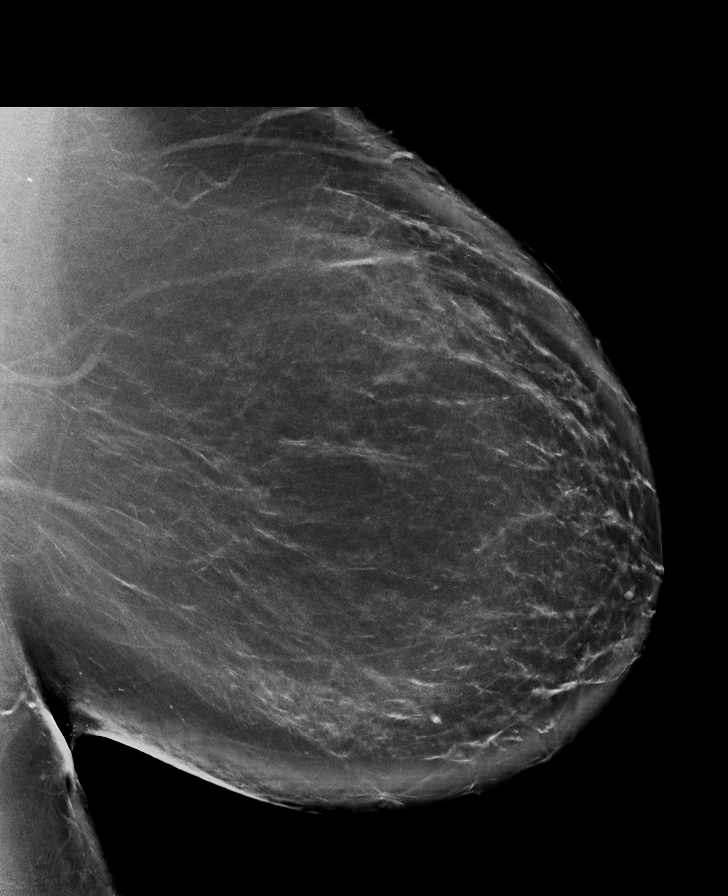

[R MLO synth-2D]
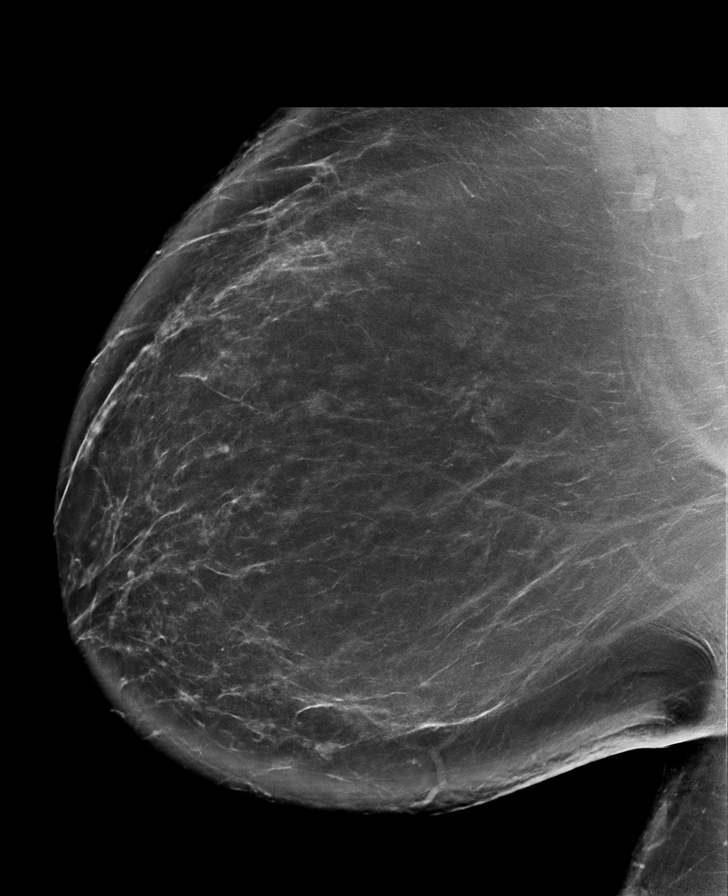

[L MLO]
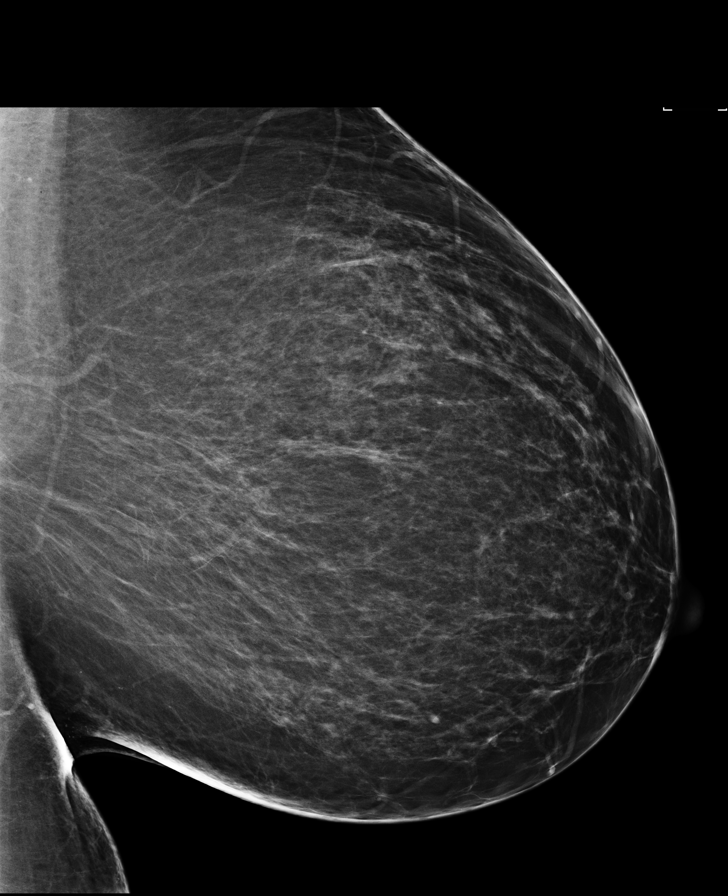

[8 of 29 positions shown; findings below may reference images not displayed]

ACR Breast Density Category b: There are scattered areas of
fibroglandular density.
FINDINGS: There are no findings suspicious for malignancy. Images were
processed with CAD.
IMPRESSION: No mammographic evidence of malignancy. A result letter of this
screening mammogram will be mailed directly to the patient.

RECOMMENDATION:
Screening mammogram in one year. (Code:97-6-RS4)

BI-RADS CATEGORY  1: Negative.

## 2017-01-20 ENCOUNTER — Ambulatory Visit: Payer: 59 | Admitting: Family Medicine

## 2017-01-29 ENCOUNTER — Ambulatory Visit (INDEPENDENT_AMBULATORY_CARE_PROVIDER_SITE_OTHER): Payer: Managed Care, Other (non HMO) | Admitting: Family Medicine

## 2017-01-29 ENCOUNTER — Encounter: Payer: Self-pay | Admitting: Family Medicine

## 2017-01-29 DIAGNOSIS — L089 Local infection of the skin and subcutaneous tissue, unspecified: Secondary | ICD-10-CM | POA: Diagnosis not present

## 2017-01-29 DIAGNOSIS — M79672 Pain in left foot: Secondary | ICD-10-CM | POA: Diagnosis not present

## 2017-01-29 NOTE — Progress Notes (Signed)
Name: Leanor RubensteinDenise K Brannum   MRN: [id number]    DOB: 02/21/1966   Date:01/29/2017       Progress Note  Subjective  Chief Complaint  Chief Complaint  Patient presents with  . Leg Pain    sore on left leg    HPI  Pt. Presents for follow up from Urgent Care for pain in the back of her left foot, was present for many months, slowly worsening. She had X rays done at the Urgent Care of both feet, and reportedly showed 'bone spurs', she was put in a special boot to take off pressure from her heel, and was given Percocet for pain relief. X ray results and interpretations are not available.  Patient reports pain has significantly improved following the above-mentioned measures.  She also has a healing wound on her left lower leg, first noticed about a month ago, then got worse 2  weeks ago. It appeared red, raised, and very painful. Now its getting better, she has used hydrocortisone on it for relief.    Past Medical History:  Diagnosis Date  . Anxiety   . High cholesterol   . Hypertension     Past Surgical History:  Procedure Laterality Date  . ABDOMINAL HYSTERECTOMY  2010   partial  . COLONOSCOPY  2015   [facility]  . SALPINGOOPHORECTOMY Bilateral 07/24/2015   Procedure: ATTEMPTED LAPAROSCOPY, ABDOMINAL BILATERAL SALPINGO OOPHORECTOMY ;  Surgeon: Hildred LaserAnika Cherry, MD;  Location: [facility] OR[facility];  Service: Gynecology;  Laterality: Bilateral;  . TONSILLECTOMY    . TUBAL LIGATION Bilateral 1988    Family History  Problem Relation Age of Onset  . Cancer Mother   . Breast cancer Mother [id number]  . Alcohol abuse Father     Social History   Social History  . Marital status: Divorced    Spouse name: N/A  . Number of children: N/A  . Years of education: N/A   Occupational History  . Not on file.   Social History Main Topics  . Smoking status: Former Smoker    Quit date: 07/12/2008  . Smokeless tobacco: Never Used  . Alcohol use 0.0 - 0.6 oz/week  . Drug use: No  . Sexual activity: Not Currently      Birth control/ protection: Surgical   Other Topics Concern  . Not on file   Social History Narrative  . No narrative on file     Current Outpatient Prescriptions:  .  atorvastatin (LIPITOR) 20 MG tablet, Take 1 tablet by mouth  daily at 6pm, Disp: 90 tablet, Rfl: 1 .  citalopram (CELEXA) 20 MG tablet, Take 1 tablet (20 mg total) by mouth daily., Disp: 90 tablet, Rfl: 1 .  triamterene-hydrochlorothiazide (MAXZIDE-25) 37.5-25 MG tablet, Take 1 tablet by mouth daily., Disp: 90 tablet, Rfl: 1  No Known Allergies   ROS  Please see HPI for complete discussion of ROS   Objective  Vitals:   01/29/17 1459  BP: 126/71  Pulse: 90  Resp: 17  Temp: 98.4 F (36.9 C)  TempSrc: Oral  SpO2: 97%  Weight: 296 lb 9.6 oz (134.5 kg)  Height: 5\' 5"  (1.651 m)    Physical Exam  Constitutional: She is well-developed, well-nourished, and in no distress.  Skin: Lesion noted. There is erythema.     Healing raised well circumscribed lesion with non draining center, tender to palpation.  Nursing note and vitals reviewed.    Assessment & Plan  1. Left foot pain Reviewed Urgent care records, X rays not  available, referred to Podiatry. - Ambulatory referral to Podiatry  2. Infected lesion of skin Healing, advised to apply Bactroban ointment for treatment.   Neven Fina Asad [facility] 01/29/2017 3:14 PM

## 2017-01-30 ENCOUNTER — Telehealth: Payer: Self-pay | Admitting: Family Medicine

## 2017-01-30 DIAGNOSIS — L089 Local infection of the skin and subcutaneous tissue, unspecified: Secondary | ICD-10-CM

## 2017-01-30 MED ORDER — MUPIROCIN CALCIUM 2 % EX CREA: 1.0000 "application " | TOPICAL_CREAM | Freq: Three times a day (TID) | CUTANEOUS | 0 refills | Status: DC

## 2017-01-30 NOTE — Telephone Encounter (Signed)
Prescription for Bactroban cream sent to pharmacy

## 2017-01-30 NOTE — Telephone Encounter (Signed)
Pt was told yesterday that  bactroban was OTC, however, when she went to the pharmacy they told her that due to it being an antibotic that they have it behind the counter therefore she is needing a prescription. Please send to [facility]

## 2017-01-30 NOTE — Telephone Encounter (Signed)
Left voice message informing pt prescription has been sent to pharmacy. °

## 2017-02-03 ENCOUNTER — Encounter: Payer: Self-pay | Admitting: Podiatry

## 2017-02-03 ENCOUNTER — Ambulatory Visit (INDEPENDENT_AMBULATORY_CARE_PROVIDER_SITE_OTHER): Payer: Managed Care, Other (non HMO)

## 2017-02-03 ENCOUNTER — Ambulatory Visit (INDEPENDENT_AMBULATORY_CARE_PROVIDER_SITE_OTHER): Payer: Managed Care, Other (non HMO) | Admitting: Podiatry

## 2017-02-03 VITALS — BP 138/68 | HR 94 | Resp 16

## 2017-02-03 DIAGNOSIS — M79672 Pain in left foot: Secondary | ICD-10-CM

## 2017-02-03 DIAGNOSIS — M7662 Achilles tendinitis, left leg: Secondary | ICD-10-CM | POA: Diagnosis not present

## 2017-02-03 NOTE — Progress Notes (Signed)
   Subjective:    Patient ID: Christine RubensteinDenise K Michael, female    DOB: 04/17/1966, 51 y.o.   MRN: [id number]  HPI: She presents today chief complaint of pain to the posterior aspect of her left heel. Since then aching and burning for the past few months she states that her feet swell in general but in the morning there particularly painful she which could no clinic be placed her in a boot and gave her anti-inflammatories she states that the boot does seem to help to some degree.    Review of Systems  All other systems reviewed and are negative.      Objective:   Physical Exam: Vital signs are stable alert and oriented 3. Pulses are strongly palpable. Neurologic system is intact. Deep tendon reflexes are intact. Muscle strength normal bilateral. Orthopedic evaluation was resolved with suicidal full range of motion or crepitation. She has pain on palpation posterior medial aspect of the left Achilles as it inserts on the calcaneus. She is no plantar fascia pain. Radiographs reviewed today demonstrating a soft tissue increase in density of the Achilles and a small retrocalcaneal heel spur. This is consistent with insertional Achilles tendinitis.        Assessment & Plan:  Insertional Achilles tendinitis left.  Plan: I injected a small amount of dexamethasone to the medial fibers just beneath the subcutaneous tissue of the left heel.  Put her plantar fascia brace and recommended that she continue to wear her Cam Walker. Follow up with her in 1 month

## 2017-02-03 NOTE — Patient Instructions (Signed)

## 2017-02-06 ENCOUNTER — Other Ambulatory Visit: Payer: Self-pay | Admitting: Family Medicine

## 2017-02-06 ENCOUNTER — Telehealth: Payer: Self-pay | Admitting: Family Medicine

## 2017-02-06 DIAGNOSIS — F419 Anxiety disorder, unspecified: Secondary | ICD-10-CM

## 2017-02-06 NOTE — Telephone Encounter (Signed)
PT NEEDS REFILL ON CITALOPRAM 20MG . PHARM IS [facility]

## 2017-02-07 DIAGNOSIS — F39 Unspecified mood [affective] disorder: Secondary | ICD-10-CM | POA: Insufficient documentation

## 2017-02-07 DIAGNOSIS — F419 Anxiety disorder, unspecified: Secondary | ICD-10-CM | POA: Insufficient documentation

## 2017-02-07 MED ORDER — CITALOPRAM HYDROBROMIDE 20 MG PO TABS: 20.0000 mg | ORAL_TABLET | Freq: Every day | ORAL | 1 refills | Status: DC

## 2017-02-07 NOTE — Telephone Encounter (Signed)
What am I to do or tell her. Are you going to do it or not. You have not written anything back. Please advise.

## 2017-02-07 NOTE — Telephone Encounter (Signed)
Prescription for citalopram has been sent to patient's pharmacy

## 2017-02-07 NOTE — Telephone Encounter (Signed)
LFT MESS FOR PT ABOUT MED CALLED INTO PHARM

## 2017-03-03 ENCOUNTER — Encounter: Payer: Self-pay | Admitting: Podiatry

## 2017-03-03 ENCOUNTER — Ambulatory Visit (INDEPENDENT_AMBULATORY_CARE_PROVIDER_SITE_OTHER): Payer: 59 | Admitting: Podiatry

## 2017-03-03 DIAGNOSIS — M7662 Achilles tendinitis, left leg: Secondary | ICD-10-CM

## 2017-03-03 NOTE — Progress Notes (Signed)
She presents today for follow-up of her Achilles tendinitis of her left heel. She states that the boot helps that is still painful with every step.  Objective: Vital signs are stable alert and oriented 3. Pulses are palpable. Neurologic sensorium is intact. Deep tendon reflexes are intact. Muscle strength is 5 over 5 dorsiflexion plantar flexors and inverters everters all digits of musculature is intact. Orthopedic evaluation demonstrates pain on palpation of the Achilles tendon of the left heel. It is still warm to the touch and painful on palpation.  Assessment: Plantar fasciitis with Achilles tendinitis left.  Plan: Continue to wear the Cam Walker but we are going to request an MRI for follow-up of this Achilles tendinitis. May consider physical therapy after we determined there is no split tear.

## 2017-03-06 ENCOUNTER — Other Ambulatory Visit: Payer: Self-pay

## 2017-03-06 ENCOUNTER — Telehealth: Payer: Self-pay

## 2017-03-06 DIAGNOSIS — M7662 Achilles tendinitis, left leg: Secondary | ICD-10-CM

## 2017-03-06 NOTE — Telephone Encounter (Signed)
[facility] no [facility] is required for MRI

## 2017-03-13 ENCOUNTER — Ambulatory Visit
Admission: RE | Admit: 2017-03-13 | Discharge: 2017-03-13 | Disposition: A | Discharge status: Home / Self Care | Payer: 59 | Source: Ambulatory Visit | Attending: Podiatry | Admitting: Podiatry

## 2017-03-13 DIAGNOSIS — M19072 Primary osteoarthritis, left ankle and foot: Secondary | ICD-10-CM | POA: Insufficient documentation

## 2017-03-13 DIAGNOSIS — M7662 Achilles tendinitis, left leg: Secondary | ICD-10-CM | POA: Insufficient documentation

## 2017-03-19 ENCOUNTER — Telehealth: Payer: Self-pay | Admitting: *Deleted

## 2017-03-19 NOTE — Telephone Encounter (Addendum)
-----   Message from Elinor ParkinsonMax T Michael, [place] sent at 03/17/2017  7:00 AM EDT ----- Send for over read and inform patient of the delay please. Also ask for comment on plantar and posterior heel. 03/19/2017-I informed pt of Dr. Geryl RankinsHyatt's orders and explained the delay. [facility] 03/25/2017-Mailed copy of MRI to [facility].

## 2017-04-01 ENCOUNTER — Encounter: Payer: Self-pay | Admitting: Podiatry

## 2017-04-14 ENCOUNTER — Other Ambulatory Visit: Payer: Self-pay | Admitting: Family Medicine

## 2017-04-14 DIAGNOSIS — I10 Essential (primary) hypertension: Secondary | ICD-10-CM

## 2017-04-23 ENCOUNTER — Encounter: Payer: Self-pay | Admitting: Podiatry

## 2017-04-23 ENCOUNTER — Ambulatory Visit (INDEPENDENT_AMBULATORY_CARE_PROVIDER_SITE_OTHER): Payer: 59 | Admitting: Podiatry

## 2017-04-23 DIAGNOSIS — M7662 Achilles tendinitis, left leg: Secondary | ICD-10-CM | POA: Diagnosis not present

## 2017-04-24 ENCOUNTER — Other Ambulatory Visit: Payer: Self-pay | Admitting: Family Medicine

## 2017-04-24 DIAGNOSIS — E7800 Pure hypercholesterolemia, unspecified: Secondary | ICD-10-CM

## 2017-04-24 NOTE — Progress Notes (Signed)
He presents today for follow-up of her Achilles tendinitis to her left leg. She states it seems to be doing a little bit better around the medial Cannula area but is more painful directly at its attachment.  Objective: No change in physical findings today on physical exam. However the MRI does state that she has Achilles tendinosis of the Achilles at its insertion site. There is bone marrow edema consisting and demonstrating stress of the bone on the Achilles.  Assessment: Insertional Achilles tendinosis.  Plan: Physical therapy follow-up with me in 1 month consider surgical intervention at that time it necessary.

## 2017-05-26 ENCOUNTER — Ambulatory Visit: Payer: 59 | Admitting: Podiatry

## 2017-07-04 ENCOUNTER — Other Ambulatory Visit: Payer: Self-pay | Admitting: Family Medicine

## 2017-07-04 DIAGNOSIS — I10 Essential (primary) hypertension: Secondary | ICD-10-CM

## 2017-07-14 ENCOUNTER — Other Ambulatory Visit: Payer: Self-pay | Admitting: Family Medicine

## 2017-07-14 DIAGNOSIS — E7800 Pure hypercholesterolemia, unspecified: Secondary | ICD-10-CM

## 2017-07-15 ENCOUNTER — Telehealth: Payer: Self-pay | Admitting: Family Medicine

## 2017-07-15 NOTE — Telephone Encounter (Signed)
Pt needs a refill on Lipitor to be sent to [facility] 90 day supply. Pt does have an appt scheduled.

## 2017-07-15 NOTE — Telephone Encounter (Signed)
Patient has not been seen since February 2018 she will need to be seen for medication refill

## 2017-07-22 ENCOUNTER — Other Ambulatory Visit: Payer: Self-pay | Admitting: Family Medicine

## 2017-07-22 DIAGNOSIS — F419 Anxiety disorder, unspecified: Secondary | ICD-10-CM

## 2017-07-23 ENCOUNTER — Other Ambulatory Visit: Payer: Self-pay | Admitting: Family Medicine

## 2017-07-23 ENCOUNTER — Telehealth: Payer: Self-pay | Admitting: Family Medicine

## 2017-07-23 DIAGNOSIS — F419 Anxiety disorder, unspecified: Secondary | ICD-10-CM

## 2017-07-23 DIAGNOSIS — E7800 Pure hypercholesterolemia, unspecified: Secondary | ICD-10-CM

## 2017-07-23 DIAGNOSIS — I10 Essential (primary) hypertension: Secondary | ICD-10-CM

## 2017-07-23 MED ORDER — ATORVASTATIN CALCIUM 20 MG PO TABS: ORAL_TABLET | ORAL | 0 refills | Status: DC

## 2017-07-23 MED ORDER — TRIAMTERENE-HCTZ 37.5-25 MG PO TABS: 1.0000 | ORAL_TABLET | Freq: Every day | ORAL | 0 refills | Status: DC

## 2017-07-23 MED ORDER — CITALOPRAM HYDROBROMIDE 20 MG PO TABS: 20.0000 mg | ORAL_TABLET | Freq: Every day | ORAL | 1 refills | Status: DC

## 2017-07-23 NOTE — Telephone Encounter (Signed)
Pt would like a refill on Atorvastatin, Citalopram and Triamterene to be sent to [facility]. Pt has an appt on 08/12/17 for her medication follow up.

## 2017-07-23 NOTE — Telephone Encounter (Signed)
Prescription for Triamterene-hydrochlorothiazide, citalopram and atorvastatin is sent to [facility].

## 2017-07-24 NOTE — Telephone Encounter (Signed)
Pt informed

## 2017-07-30 ENCOUNTER — Encounter: Payer: 59 | Admitting: Family Medicine

## 2017-08-11 ENCOUNTER — Encounter: Payer: Self-pay | Admitting: Family Medicine

## 2017-08-11 ENCOUNTER — Ambulatory Visit (INDEPENDENT_AMBULATORY_CARE_PROVIDER_SITE_OTHER): Payer: 59 | Admitting: Family Medicine

## 2017-08-11 VITALS — BP 112/64 | HR 84 | Temp 98.1°F | Resp 16 | Ht 65.0 in | Wt 293.5 lb

## 2017-08-11 DIAGNOSIS — L290 Pruritus ani: Secondary | ICD-10-CM

## 2017-08-11 MED ORDER — HYDROCORTISONE ACETATE 25 MG RE SUPP: 25.0000 mg | Freq: Two times a day (BID) | RECTAL | 2 refills | Status: DC | PRN

## 2017-08-11 NOTE — Progress Notes (Signed)
Name: Christine Michael   MRN: [id number]    DOB: January 01, 1966   Date:08/11/2017       Progress Note  Subjective  Chief Complaint  Chief Complaint  Patient presents with  . Follow-up    patient was told that she had to come in today for a med check  . Medication Refill    anusol suppositories    HPI  Anal Pruritus: Patient has history of anal pruritus, has noted itching around the anal area, in the past she has treated symptoms successfully with Anusol suppository, requesting refill. No bleeding, mass or other constitutional symptoms  Past Medical History:  Diagnosis Date  . Anxiety   . High cholesterol   . Hypertension     Past Surgical History:  Procedure Laterality Date  . ABDOMINAL HYSTERECTOMY  2010   partial  . COLONOSCOPY  2015   [facility]  . SALPINGOOPHORECTOMY Bilateral 07/24/2015   Procedure: ATTEMPTED LAPAROSCOPY, ABDOMINAL BILATERAL SALPINGO OOPHORECTOMY ;  Surgeon: Hildred Laser, MD;  Location: [facility] ORS;  Service: Gynecology;  Laterality: Bilateral;  . TONSILLECTOMY    . TUBAL LIGATION Bilateral 1988    Family History  Problem Relation Age of Onset  . Cancer Mother   . Breast cancer Mother 74  . Alcohol abuse Father     Social History   Social History  . Marital status: Divorced    Spouse name: N/A  . Number of children: N/A  . Years of education: N/A   Occupational History  . Not on file.   Social History Main Topics  . Smoking status: Former Smoker    Quit date: 07/12/2008  . Smokeless tobacco: Never Used  . Alcohol use 0.0 - 0.6 oz/week  . Drug use: No  . Sexual activity: Not Currently    Birth control/ protection: Surgical   Other Topics Concern  . Not on file   Social History Narrative  . No narrative on file     Current Outpatient Prescriptions:  .  atorvastatin (LIPITOR) 20 MG tablet, TAKE 1 TABLET BY MOUTH  DAILY AT 6PM, Disp: 90 tablet, Rfl: 0 .  citalopram (CELEXA) 20 MG tablet, Take 1 tablet (20 mg total) by mouth daily.,  Disp: 90 tablet, Rfl: 1 .  hydrocortisone (ANUSOL-HC) 25 MG suppository, Place 25 mg rectally 2 (two) times daily., Disp: , Rfl:  .  triamterene-hydrochlorothiazide (MAXZIDE-25) 37.5-25 MG tablet, Take 1 tablet by mouth daily., Disp: 90 tablet, Rfl: 0 .  mupirocin cream (BACTROBAN) 2 %, Apply 1 application topically 3 (three) times daily. (Patient not taking: Reported on 08/11/2017), Disp: 15 g, Rfl: 0  No Known Allergies   ROS  Objective  Vitals:   08/11/17 1607  BP: 112/64  Pulse: 84  Resp: 16  Temp: 98.1 F (36.7 C)  TempSrc: Oral  SpO2: 98%  Weight: 293 lb 8 oz (133.1 kg)  Height: 5\' 5"  (1.651 m)    Physical Exam  Constitutional: She is well-developed, well-nourished, and in no distress.  Cardiovascular: Normal rate, regular rhythm and normal heart sounds.   No murmur heard. Pulmonary/Chest: Effort normal and breath sounds normal. She has no wheezes.  Genitourinary: Rectum normal. Rectal exam shows no external hemorrhoid and no fissure.  Nursing note and vitals reviewed.     Assessment & Plan  1. Anal pruritus Prescription for Anusol suppositories provided to be used as needed - hydrocortisone (ANUSOL-HC) 25 MG suppository; Place 1 suppository (25 mg total) rectally 2 (two) times daily as needed for hemorrhoids  or anal itching.  Dispense: 12 suppository; Refill: 2   Sajid Ruppert [facility] 08/11/2017 4:17 PM

## 2017-08-12 ENCOUNTER — Ambulatory Visit: Payer: Managed Care, Other (non HMO) | Admitting: Family Medicine

## 2017-09-02 ENCOUNTER — Ambulatory Visit (INDEPENDENT_AMBULATORY_CARE_PROVIDER_SITE_OTHER): Payer: 59 | Admitting: Family Medicine

## 2017-09-02 ENCOUNTER — Encounter: Payer: Self-pay | Admitting: Family Medicine

## 2017-09-02 ENCOUNTER — Other Ambulatory Visit: Payer: Self-pay | Admitting: Family Medicine

## 2017-09-02 VITALS — BP 115/68 | HR 88 | Temp 98.1°F | Resp 16 | Ht 65.0 in | Wt 296.5 lb

## 2017-09-02 DIAGNOSIS — Z0000 Encounter for general adult medical examination without abnormal findings: Secondary | ICD-10-CM | POA: Diagnosis not present

## 2017-09-02 DIAGNOSIS — Z23 Encounter for immunization: Secondary | ICD-10-CM | POA: Diagnosis not present

## 2017-09-02 DIAGNOSIS — Z1159 Encounter for screening for other viral diseases: Secondary | ICD-10-CM | POA: Diagnosis not present

## 2017-09-02 DIAGNOSIS — M85852 Other specified disorders of bone density and structure, left thigh: Secondary | ICD-10-CM | POA: Diagnosis not present

## 2017-09-02 DIAGNOSIS — Z1231 Encounter for screening mammogram for malignant neoplasm of breast: Secondary | ICD-10-CM | POA: Diagnosis not present

## 2017-09-02 DIAGNOSIS — Z1239 Encounter for other screening for malignant neoplasm of breast: Secondary | ICD-10-CM

## 2017-09-02 NOTE — Progress Notes (Signed)
Name: Christine RubensteinDenise K Michael   MRN: [id number]    DOB: 05/25/1966   Date:09/02/2017       Progress Note  Subjective  Chief Complaint  Chief Complaint  Patient presents with  . Annual Exam    CPE    HPI  Pt. Presents for complete physical exam. Her colonoscopy was completed in 2015, repeat in 2025. She is due for screening mammogram and DEXA scan. Pap smear was completed in 2017, repeat in 2020.  She is due for hepatitis C screening.    Past Medical History:  Diagnosis Date  . Anxiety   . High cholesterol   . Hypertension     Past Surgical History:  Procedure Laterality Date  . ABDOMINAL HYSTERECTOMY  2010   partial  . COLONOSCOPY  2015   [facility]  . SALPINGOOPHORECTOMY Bilateral 07/24/2015   Procedure: ATTEMPTED LAPAROSCOPY, ABDOMINAL BILATERAL SALPINGO OOPHORECTOMY ;  Surgeon: Hildred LaserAnika Cherry, MD;  Location: [facility] ORS;  Service: Gynecology;  Laterality: Bilateral;  . TONSILLECTOMY    . TUBAL LIGATION Bilateral 1988    Family History  Problem Relation Age of Onset  . Cancer Mother   . Breast cancer Mother 2373  . Alcohol abuse Father     Social History   Social History  . Marital status: Divorced    Spouse name: N/A  . Number of children: N/A  . Years of education: N/A   Occupational History  . Not on file.   Social History Main Topics  . Smoking status: Former Smoker    Quit date: 07/12/2008  . Smokeless tobacco: Never Used  . Alcohol use 0.0 - 0.6 oz/week  . Drug use: No  . Sexual activity: Not Currently    Birth control/ protection: Surgical   Other Topics Concern  . Not on file   Social History Narrative  . No narrative on file     Current Outpatient Prescriptions:  .  atorvastatin (LIPITOR) 20 MG tablet, TAKE 1 TABLET BY MOUTH  DAILY AT 6PM, Disp: 90 tablet, Rfl: 0 .  citalopram (CELEXA) 20 MG tablet, Take 20 mg by mouth daily., Disp: , Rfl:  .  triamterene-hydrochlorothiazide (MAXZIDE-25) 37.5-25 MG tablet, Take 1 tablet by mouth daily., Disp: 90  tablet, Rfl: 0  No Known Allergies   Review of Systems  Constitutional: Negative for chills, fever and malaise/fatigue.  HENT: Positive for sinus pain. Negative for congestion, ear pain and sore throat.   Eyes: Negative for blurred vision and double vision.  Respiratory: Negative for cough, sputum production and shortness of breath.   Cardiovascular: Negative for chest pain, palpitations and leg swelling.  Gastrointestinal: Negative for blood in stool, constipation, diarrhea, heartburn, nausea and vomiting.  Genitourinary: Negative for dysuria and hematuria.  Musculoskeletal: Negative for back pain and neck pain.  Skin: Negative for itching and rash.  Neurological: Negative for dizziness and headaches.  Psychiatric/Behavioral: Negative for depression. The patient is not nervous/anxious and does not have insomnia.      Objective  Vitals:   09/02/17 0848  BP: 115/68  Pulse: 88  Resp: 16  Temp: 98.1 F (36.7 C)  TempSrc: Oral  SpO2: 97%  Weight: 296 lb 8 oz (134.5 kg)  Height: 5\' 5"  (1.651 m)    Physical Exam  Constitutional: She is oriented to person, place, and time and well-developed, well-nourished, and in no distress.  HENT:  Head: Normocephalic and atraumatic.  Right Ear: External ear normal.  Left Ear: External ear normal.  Cardiovascular: Normal rate, regular  rhythm and normal heart sounds.   No murmur heard. Pulmonary/Chest: Effort normal and breath sounds normal. She has no wheezes.  Abdominal: Soft. Bowel sounds are normal. There is no tenderness.  Musculoskeletal: She exhibits no edema.  Neurological: She is alert and oriented to person, place, and time.  Psychiatric: Mood, memory, affect and judgment normal.  Nursing note and vitals reviewed.    Assessment & Plan  1. Well woman exam without gynecological exam  - CBC with Differential/Platelet - TSH - VITAMIN D 25 Hydroxy (Vit-D Deficiency, Fractures) - Lipid panel - COMPLETE METABOLIC PANEL WITH  GFR  2. Need for hepatitis C screening test  - Hepatitis C antibody  3. Osteopenia of left hip  - DG Bone Density; Future  4. Screening for breast cancer  - MM Digital Screening; Future  5. Need for Tdap vaccination  - Tdap vaccine greater than or equal to 7yo IM  6. Need for influenza vaccination  - Flu Vaccine QUAD 6+ mos PF IM (Fluarix Quad PF)  Christine Michael [facility] 09/02/2017 8:57 AM

## 2017-09-03 LAB — CMP14+EGFR
ALBUMIN: 4.6 g/dL (ref 3.5–5.5)
ALT: 22 IU/L (ref 0–32)
AST: 24 IU/L (ref 0–40)
Albumin/Globulin Ratio: 1.6 (ref 1.2–2.2)
Alkaline Phosphatase: 79 IU/L (ref 39–117)
BILIRUBIN TOTAL: 0.5 mg/dL (ref 0.0–1.2)
BUN / CREAT RATIO: 16 (ref 9–23)
BUN: 11 mg/dL (ref 6–24)
CHLORIDE: 99 mmol/L (ref 96–106)
CO2: 28 mmol/L (ref 20–29)
Calcium: 10.1 mg/dL (ref 8.7–10.2)
Creatinine, Ser: 0.68 mg/dL (ref 0.57–1.00)
GFR calc non Af Amer: 100 mL/min/{1.73_m2} (ref >59)
GFR, EST AFRICAN AMERICAN: 115 mL/min/{1.73_m2} (ref >59)
GLUCOSE: 94 mg/dL (ref 65–99)
Globulin, Total: 2.8 g/dL (ref 1.5–4.5)
Potassium: 4.1 mmol/L (ref 3.5–5.2)
Sodium: 143 mmol/L (ref 134–144)
TOTAL PROTEIN: 7.4 g/dL (ref 6.0–8.5)

## 2017-09-03 LAB — CBC WITH DIFFERENTIAL/PLATELET
BASOS: 0 %
Basophils Absolute: 0 10*3/uL (ref 0.0–0.2)
EOS (ABSOLUTE): 0.2 10*3/uL (ref 0.0–0.4)
EOS: 4 %
HEMATOCRIT: 39.2 % (ref 34.0–46.6)
HEMOGLOBIN: 13 g/dL (ref 11.1–15.9)
Immature Grans (Abs): 0 10*3/uL (ref 0.0–0.1)
Immature Granulocytes: 0 %
LYMPHS ABS: 1.7 10*3/uL (ref 0.7–3.1)
Lymphs: 31 %
MCH: 26.7 pg (ref 26.6–33.0)
MCHC: 33.2 g/dL (ref 31.5–35.7)
MCV: 81 fL (ref 79–97)
MONOCYTES: 8 %
Monocytes Absolute: 0.4 10*3/uL (ref 0.1–0.9)
NEUTROS ABS: 3.1 10*3/uL (ref 1.4–7.0)
Neutrophils: 57 %
PLATELETS: 211 10*3/uL (ref 150–379)
RBC: 4.87 x10E6/uL (ref 3.77–5.28)
RDW: 15 % (ref 12.3–15.4)
WBC: 5.4 10*3/uL (ref 3.4–10.8)

## 2017-09-03 LAB — LIPID PANEL WITH LDL/HDL RATIO
CHOLESTEROL TOTAL: 175 mg/dL (ref 100–199)
HDL: 58 mg/dL (ref >39)
LDL CALC: 103 mg/dL — AB (ref 0–99)
LDl/HDL Ratio: 1.8 ratio (ref 0.0–3.2)
TRIGLYCERIDES: 69 mg/dL (ref 0–149)
VLDL Cholesterol Cal: 14 mg/dL (ref 5–40)

## 2017-09-03 LAB — HCV COMMENT:

## 2017-09-03 LAB — TSH: TSH: 0.96 u[IU]/mL (ref 0.450–4.500)

## 2017-09-03 LAB — HEPATITIS C ANTIBODY (REFLEX)

## 2017-09-03 LAB — VITAMIN D 25 HYDROXY (VIT D DEFICIENCY, FRACTURES): VIT D 25 HYDROXY: 34 ng/mL (ref 30.0–100.0)

## 2017-09-16 ENCOUNTER — Ambulatory Visit
Admission: RE | Admit: 2017-09-16 | Discharge: 2017-09-16 | Disposition: A | Discharge status: Home / Self Care | Payer: 59 | Source: Ambulatory Visit | Attending: Family Medicine | Admitting: Family Medicine

## 2017-09-16 DIAGNOSIS — Z1231 Encounter for screening mammogram for malignant neoplasm of breast: Secondary | ICD-10-CM | POA: Diagnosis not present

## 2017-09-16 DIAGNOSIS — Z1239 Encounter for other screening for malignant neoplasm of breast: Secondary | ICD-10-CM

## 2017-09-29 ENCOUNTER — Telehealth: Payer: Self-pay | Admitting: Family Medicine

## 2017-09-29 NOTE — Telephone Encounter (Signed)
PT SAID THAT WE SHOULD BE RECEIVING A REQUEST FOR AN ORDER FOR A FACE MASK FOR HER CPAP MACHINE. IT WILL BE COMING FROM [facility]. SHE NEEDS THIS.

## 2017-09-30 NOTE — Telephone Encounter (Signed)
I placed this form on your desk, please address. Thanks!

## 2017-10-01 NOTE — Telephone Encounter (Signed)
Pt informed of the need for appt.

## 2017-10-01 NOTE — Telephone Encounter (Signed)
Patient needs a face-to-face evaluation based on the documentation requirements of the sleep medicine supplies. We schedule an appointment.

## 2017-10-01 NOTE — Telephone Encounter (Signed)
Form is now on your desk, thanks sorry

## 2017-10-09 DIAGNOSIS — G4733 Obstructive sleep apnea (adult) (pediatric): Secondary | ICD-10-CM | POA: Diagnosis not present

## 2017-10-11 ENCOUNTER — Other Ambulatory Visit: Payer: Self-pay | Admitting: Family Medicine

## 2017-10-11 DIAGNOSIS — E7800 Pure hypercholesterolemia, unspecified: Secondary | ICD-10-CM

## 2017-10-13 ENCOUNTER — Ambulatory Visit
Admission: RE | Admit: 2017-10-13 | Discharge: 2017-10-13 | Disposition: A | Discharge status: Home / Self Care | Payer: 59 | Source: Ambulatory Visit | Attending: Family Medicine | Admitting: Family Medicine

## 2017-10-13 DIAGNOSIS — M8589 Other specified disorders of bone density and structure, multiple sites: Secondary | ICD-10-CM | POA: Diagnosis not present

## 2017-10-13 DIAGNOSIS — M85851 Other specified disorders of bone density and structure, right thigh: Secondary | ICD-10-CM | POA: Diagnosis not present

## 2017-10-13 DIAGNOSIS — M8588 Other specified disorders of bone density and structure, other site: Secondary | ICD-10-CM | POA: Insufficient documentation

## 2017-10-13 DIAGNOSIS — M85852 Other specified disorders of bone density and structure, left thigh: Secondary | ICD-10-CM

## 2017-11-05 DIAGNOSIS — H40003 Preglaucoma, unspecified, bilateral: Secondary | ICD-10-CM | POA: Diagnosis not present

## 2017-11-20 ENCOUNTER — Ambulatory Visit: Payer: 59 | Admitting: Family Medicine

## 2017-12-19 ENCOUNTER — Other Ambulatory Visit: Payer: Self-pay | Admitting: Family Medicine

## 2017-12-19 DIAGNOSIS — I10 Essential (primary) hypertension: Secondary | ICD-10-CM

## 2018-01-01 ENCOUNTER — Other Ambulatory Visit: Payer: Self-pay | Admitting: Family Medicine

## 2018-01-01 DIAGNOSIS — E7800 Pure hypercholesterolemia, unspecified: Secondary | ICD-10-CM

## 2018-01-04 ENCOUNTER — Other Ambulatory Visit: Payer: Self-pay | Admitting: Family Medicine

## 2018-01-04 DIAGNOSIS — F419 Anxiety disorder, unspecified: Secondary | ICD-10-CM

## 2018-02-09 DIAGNOSIS — I10 Essential (primary) hypertension: Secondary | ICD-10-CM | POA: Diagnosis not present

## 2018-02-09 DIAGNOSIS — G4733 Obstructive sleep apnea (adult) (pediatric): Secondary | ICD-10-CM | POA: Diagnosis not present

## 2018-02-23 ENCOUNTER — Other Ambulatory Visit: Payer: Self-pay | Admitting: Family Medicine

## 2018-02-23 DIAGNOSIS — F419 Anxiety disorder, unspecified: Secondary | ICD-10-CM

## 2018-02-23 MED ORDER — CITALOPRAM HYDROBROMIDE 20 MG PO TABS: 20.0000 mg | ORAL_TABLET | Freq: Every day | ORAL | 0 refills | Status: DC

## 2018-02-23 MED ORDER — CITALOPRAM HYDROBROMIDE 20 MG PO TABS: 20.0000 mg | ORAL_TABLET | Freq: Every day | ORAL | 1 refills | Status: DC

## 2018-02-23 NOTE — Telephone Encounter (Signed)
Copied from CRM [id number]. Topic: Quick Communication - Rx Refill/Question >> Feb 23, 2018 11:28 AM Crist InfanteHarrald, Kathy J wrote: Medication: citalopram (CELEXA) 20 MG tablet 90 days Has the patient contacted their pharmacy? Yes  But last filled by Dr Sherryll Burgershah.  Pt has scheduled establish with Dr Sherie DonLada on 04/09/18. But needs new Rx for this med  [facility] [place] [id number] [phone] (Phone) [phone] (Fax)

## 2018-02-23 NOTE — Telephone Encounter (Signed)
Rx says historical provider, but Dr. Sherryll BurgerShah has in fact prescribed this in the past Next appt with me in April Rx approved

## 2018-02-23 NOTE — Progress Notes (Signed)
rx was clicked for local pharmacy instead of mail order Next rx sent

## 2018-03-06 ENCOUNTER — Other Ambulatory Visit: Payer: Self-pay

## 2018-03-06 DIAGNOSIS — I10 Essential (primary) hypertension: Secondary | ICD-10-CM

## 2018-03-06 MED ORDER — TRIAMTERENE-HCTZ 37.5-25 MG PO TABS: 1.0000 | ORAL_TABLET | Freq: Every day | ORAL | 0 refills | Status: DC

## 2018-03-06 NOTE — Telephone Encounter (Signed)
Last CMP reviewed; Rx approved appt in April

## 2018-03-06 NOTE — Telephone Encounter (Signed)
90 day

## 2018-03-27 ENCOUNTER — Other Ambulatory Visit: Payer: Self-pay | Admitting: Family Medicine

## 2018-03-27 DIAGNOSIS — E7800 Pure hypercholesterolemia, unspecified: Secondary | ICD-10-CM

## 2018-03-27 NOTE — Telephone Encounter (Signed)
Copied from CRM [id number]. Topic: Quick Communication - Rx Refill/Question >> Mar 27, 2018  5:03 PM Raquel SarnaHayes, Teresa G wrote: atorvastatin (LIPITOR) 20 MG tablet  Pt needing this called in w/ Dr. Marlise EvesLada's name since Dr. Sherryll BurgerShah is no longer her dr.  [facility] [place] [address] [place] [id number] Phone: [phone] Fax: [phone]

## 2018-03-30 MED ORDER — ATORVASTATIN CALCIUM 20 MG PO TABS: 20.0000 mg | ORAL_TABLET | Freq: Every day | ORAL | 0 refills | Status: DC

## 2018-03-30 NOTE — Telephone Encounter (Signed)
Reviewed last lipids and SGPT Upcoming appt Rx approved

## 2018-04-09 ENCOUNTER — Ambulatory Visit: Payer: 59 | Admitting: Family Medicine

## 2018-04-09 ENCOUNTER — Encounter: Payer: Self-pay | Admitting: Family Medicine

## 2018-04-09 VITALS — BP 120/70 | HR 97 | Temp 98.2°F | Resp 16 | Ht 65.0 in | Wt 305.0 lb

## 2018-04-09 DIAGNOSIS — I10 Essential (primary) hypertension: Secondary | ICD-10-CM

## 2018-04-09 DIAGNOSIS — F419 Anxiety disorder, unspecified: Secondary | ICD-10-CM | POA: Diagnosis not present

## 2018-04-09 DIAGNOSIS — Z862 Personal history of diseases of the blood and blood-forming organs and certain disorders involving the immune mechanism: Secondary | ICD-10-CM

## 2018-04-09 DIAGNOSIS — E7800 Pure hypercholesterolemia, unspecified: Secondary | ICD-10-CM | POA: Diagnosis not present

## 2018-04-09 DIAGNOSIS — E559 Vitamin D deficiency, unspecified: Secondary | ICD-10-CM | POA: Diagnosis not present

## 2018-04-09 DIAGNOSIS — R7303 Prediabetes: Secondary | ICD-10-CM

## 2018-04-09 DIAGNOSIS — Z5181 Encounter for therapeutic drug level monitoring: Secondary | ICD-10-CM | POA: Insufficient documentation

## 2018-04-09 DIAGNOSIS — R739 Hyperglycemia, unspecified: Secondary | ICD-10-CM

## 2018-04-09 MED ORDER — CITALOPRAM HYDROBROMIDE 20 MG PO TABS: 10.0000 mg | ORAL_TABLET | Freq: Every day | ORAL | Status: DC

## 2018-04-09 NOTE — Assessment & Plan Note (Signed)
Check glucose and A1c 

## 2018-04-09 NOTE — Patient Instructions (Addendum)
Try to follow the DASH guidelines (DASH stands for Dietary Approaches to Stop Hypertension). Try to limit the sodium in your diet to no more than 1,500mg  of sodium per day. Certainly try to not exceed 2,000 mg per day at the very most. Do not add salt when cooking or at the table.  Check the sodium amount on labels when shopping, and choose items lower in sodium when given a choice. Avoid or limit foods that already contain a lot of sodium. Eat a diet rich in fruits and vegetables and whole grains, and try to lose weight if overweight or obese  Check out the information at familydoctor.org entitled "Nutrition for Weight Loss: What You Need to Know about Fad Diets" Try to lose between 1-2 pounds per week by taking in fewer calories and burning off more calories You can succeed by limiting portions, limiting foods dense in calories and fat, becoming more active, and drinking 8 glasses of water a day (64 ounces) Don't skip meals, especially breakfast, as skipping meals may alter your metabolism Do not use over-the-counter weight loss pills or gimmicks that claim rapid weight loss A healthy BMI (or body mass index) is between 18.5 and 24.9 You can calculate your ideal BMI at the NIH website JobEconomics.hu  Try to limit saturated fats in your diet (bologna, hot dogs, barbeque, cheeseburgers, hamburgers, steak, bacon, sausage, cheese, etc.) and get more fresh fruits, vegetables, and whole grains  Take just half of a pill of citalopram for four week, then stop, call me with any issues Try for 12 pounds down before your physical  DASH Eating Plan DASH stands for "Dietary Approaches to Stop Hypertension." The DASH eating plan is a healthy eating plan that has been shown to reduce high blood pressure (hypertension). It may also reduce your risk for type 2 diabetes, heart disease, and stroke. The DASH eating plan may also help with weight loss. What are tips  for following this plan? General guidelines  Avoid eating more than 2,300 mg (milligrams) of salt (sodium) a day. If you have hypertension, you may need to reduce your sodium intake to 1,500 mg a day.  Limit alcohol intake to no more than 1 drink a day for nonpregnant women and 2 drinks a day for men. One drink equals 12 oz of beer, 5 oz of wine, or 1 oz of hard liquor.  Work with your health care provider to maintain a healthy body weight or to lose weight. Ask what an ideal weight is for you.  Get at least 30 minutes of exercise that causes your heart to beat faster (aerobic exercise) most days of the week. Activities may include walking, swimming, or biking.  Work with your health care provider or diet and nutrition specialist (dietitian) to adjust your eating plan to your individual calorie needs. Reading food labels  Check food labels for the amount of sodium per serving. Choose foods with less than 5 percent of the Daily Value of sodium. Generally, foods with less than 300 mg of sodium per serving fit into this eating plan.  To find whole grains, look for the word "whole" as the first word in the ingredient list. Shopping  Buy products labeled as "low-sodium" or "no salt added."  Buy fresh foods. Avoid canned foods and premade or frozen meals. Cooking  Avoid adding salt when cooking. Use salt-free seasonings or herbs instead of table salt or sea salt. Check with your health care provider or pharmacist before using salt substitutes.  Do not fry foods. Cook foods using healthy methods such as baking, boiling, grilling, and broiling instead.  Cook with heart-healthy oils, such as olive, canola, soybean, or sunflower oil. Meal planning   Eat a balanced diet that includes: ? 5 or more servings of fruits and vegetables each day. At each meal, try to fill half of your plate with fruits and vegetables. ? Up to 6-8 servings of whole grains each day. ? Less than 6 oz of lean meat,  poultry, or fish each day. A 3-oz serving of meat is about the same size as a deck of cards. One egg equals 1 oz. ? 2 servings of low-fat dairy each day. ? A serving of nuts, seeds, or beans 5 times each week. ? Heart-healthy fats. Healthy fats called Omega-3 fatty acids are found in foods such as flaxseeds and coldwater fish, like sardines, salmon, and mackerel.  Limit how much you eat of the following: ? Canned or prepackaged foods. ? Food that is high in trans fat, such as fried foods. ? Food that is high in saturated fat, such as fatty meat. ? Sweets, desserts, sugary drinks, and other foods with added sugar. ? Full-fat dairy products.  Do not salt foods before eating.  Try to eat at least 2 vegetarian meals each week.  Eat more home-cooked food and less restaurant, buffet, and fast food.  When eating at a restaurant, ask that your food be prepared with less salt or no salt, if possible. What foods are recommended? The items listed may not be a complete list. Talk with your dietitian about what dietary choices are best for you. Grains Whole-grain or whole-wheat bread. Whole-grain or whole-wheat pasta. Brown rice. Orpah Cobbatmeal. Quinoa. Bulgur. Whole-grain and low-sodium cereals. Pita bread. Low-fat, low-sodium crackers. Whole-wheat flour tortillas. Vegetables Fresh or frozen vegetables (raw, steamed, roasted, or grilled). Low-sodium or reduced-sodium tomato and vegetable juice. Low-sodium or reduced-sodium tomato sauce and tomato paste. Low-sodium or reduced-sodium canned vegetables. Fruits All fresh, dried, or frozen fruit. Canned fruit in natural juice (without added sugar). Meat and other protein foods Skinless chicken or [place]. Ground chicken or [place]. Pork with fat trimmed off. Fish and seafood. Egg whites. Dried beans, peas, or lentils. Unsalted nuts, nut butters, and seeds. Unsalted canned beans. Lean cuts of beef with fat trimmed off. Low-sodium, lean deli meat. Dairy Low-fat  (1%) or fat-free (skim) milk. Fat-free, low-fat, or reduced-fat cheeses. Nonfat, low-sodium ricotta or cottage cheese. Low-fat or nonfat yogurt. Low-fat, low-sodium cheese. Fats and oils Soft margarine without trans fats. Vegetable oil. Low-fat, reduced-fat, or light mayonnaise and salad dressings (reduced-sodium). Canola, safflower, olive, soybean, and sunflower oils. Avocado. Seasoning and other foods Herbs. Spices. Seasoning mixes without salt. Unsalted popcorn and pretzels. Fat-free sweets. What foods are not recommended? The items listed may not be a complete list. Talk with your dietitian about what dietary choices are best for you. Grains Baked goods made with fat, such as croissants, muffins, or some breads. Dry pasta or rice meal packs. Vegetables Creamed or fried vegetables. Vegetables in a cheese sauce. Regular canned vegetables (not low-sodium or reduced-sodium). Regular canned tomato sauce and paste (not low-sodium or reduced-sodium). Regular tomato and vegetable juice (not low-sodium or reduced-sodium). Rosita FirePickles. Olives. Fruits Canned fruit in a light or heavy syrup. Fried fruit. Fruit in cream or butter sauce. Meat and other protein foods Fatty cuts of meat. Ribs. Fried meat. Tomasa BlaseBacon. Sausage. Bologna and other processed lunch meats. Salami. Fatback. Hotdogs. Bratwurst. Salted nuts and seeds. Canned beans  with added salt. Canned or smoked fish. Whole eggs or egg yolks. Chicken or [place] with skin. Dairy Whole or 2% milk, cream, and half-and-half. Whole or full-fat cream cheese. Whole-fat or sweetened yogurt. Full-fat cheese. Nondairy creamers. Whipped toppings. Processed cheese and cheese spreads. Fats and oils Butter. Stick margarine. Lard. Shortening. Ghee. Bacon fat. Tropical oils, such as coconut, palm kernel, or palm oil. Seasoning and other foods Salted popcorn and pretzels. Onion salt, garlic salt, seasoned salt, table salt, and sea salt. Worcestershire sauce. Tartar sauce.  Barbecue sauce. Teriyaki sauce. Soy sauce, including reduced-sodium. Steak sauce. Canned and packaged gravies. Fish sauce. Oyster sauce. Cocktail sauce. Horseradish that you find on the shelf. Ketchup. Mustard. Meat flavorings and tenderizers. Bouillon cubes. Hot sauce and Tabasco sauce. Premade or packaged marinades. Premade or packaged taco seasonings. Relishes. Regular salad dressings. Where to find more information:  National Heart, Lung, and Blood Institute: PopSteam.is  American Heart Association: www.heart.org Summary  The DASH eating plan is a healthy eating plan that has been shown to reduce high blood pressure (hypertension). It may also reduce your risk for type 2 diabetes, heart disease, and stroke.  With the DASH eating plan, you should limit salt (sodium) intake to 2,300 mg a day. If you have hypertension, you may need to reduce your sodium intake to 1,500 mg a day.  When on the DASH eating plan, aim to eat more fresh fruits and vegetables, whole grains, lean proteins, low-fat dairy, and heart-healthy fats.  Work with your health care provider or diet and nutrition specialist (dietitian) to adjust your eating plan to your individual calorie needs. This information is not intended to replace advice given to you by your health care provider. Make sure you discuss any questions you have with your health care provider. Document Released: 11/28/2011 Document Revised: 12/02/2016 Document Reviewed: 12/02/2016 Elsevier Interactive Patient Education  2018 [employer].  Obesity, Adult Obesity is the condition of having too much total body fat. Being overweight or obese means that your weight is greater than what is considered healthy for your body size. Obesity is determined by a measurement called BMI. BMI is an estimate of body fat and is calculated from height and weight. For adults, a BMI of 30 or higher is considered obese. Obesity can eventually lead to other health concerns  and major illnesses, including:  Stroke.  Coronary artery disease (CAD).  Type 2 diabetes.  Some types of cancer, including cancers of the colon, breast, uterus, and gallbladder.  Osteoarthritis.  High blood pressure (hypertension).  High cholesterol.  Sleep apnea.  Gallbladder stones.  Infertility problems.  What are the causes? The main cause of obesity is taking in (consuming) more calories than your body uses for energy. Other factors that contribute to this condition may include:  Being born with genes that make you more likely to become obese.  Having a medical condition that causes obesity. These conditions include: ? Hypothyroidism. ? Polycystic ovarian syndrome (PCOS). ? Binge-eating disorder. ? Cushing syndrome.  Taking certain medicines, such as steroids, antidepressants, and seizure medicines.  Not being physically active (sedentary lifestyle).  Living where there are limited places to exercise safely or buy healthy foods.  Not getting enough sleep.  What increases the risk? The following factors may increase your risk of this condition:  Having a family history of obesity.  Being a woman of African-American descent.  Being a man of Hispanic descent.  What are the signs or symptoms? Having excessive body fat is the  main symptom of this condition. How is this diagnosed? This condition may be diagnosed based on:  Your symptoms.  Your medical history.  A physical exam. Your health care provider may measure: ? Your BMI. If you are an adult with a BMI between 25 and less than 30, you are considered overweight. If you are an adult with a BMI of 30 or higher, you are considered obese. ? The distances around your hips and your waist (circumferences). These may be compared to each other to help diagnose your condition. ? Your skinfold thickness. Your health care provider may gently pinch a fold of your skin and measure it.  How is this  treated? Treatment for this condition often includes changing your lifestyle. Treatment may include some or all of the following:  Dietary changes. Work with your health care provider and a dietitian to set a weight-loss goal that is healthy and reasonable for you. Dietary changes may include eating: ? Smaller portions. A portion size is the amount of a particular food that is healthy for you to eat at one time. This varies from person to person. ? Low-calorie or low-fat options. ? More whole grains, fruits, and vegetables.  Regular physical activity. This may include aerobic activity (cardio) and strength training.  Medicine to help you lose weight. Your health care provider may prescribe medicine if you are unable to lose 1 pound a week after 6 weeks of eating more healthily and doing more physical activity.  Surgery. Surgical options may include gastric banding and gastric bypass. Surgery may be done if: ? Other treatments have not helped to improve your condition. ? You have a BMI of 40 or higher. ? You have life-threatening health problems related to obesity.  Follow these instructions at home:  Eating and drinking   Follow recommendations from your health care provider about what you eat and drink. Your health care provider may advise you to: ? Limit fast foods, sweets, and processed snack foods. ? Choose low-fat options, such as low-fat milk instead of whole milk. ? Eat 5 or more servings of fruits or vegetables every day. ? Eat at home more often. This gives you more control over what you eat. ? Choose healthy foods when you eat out. ? Learn what a healthy portion size is. ? Keep low-fat snacks on hand. ? Avoid sugary drinks, such as soda, fruit juice, iced tea sweetened with sugar, and flavored milk. ? Eat a healthy breakfast.  Drink enough water to keep your urine clear or pale yellow.  Do not go without eating for long periods of time (do not fast) or follow a fad diet.  Fasting and fad diets can be unhealthy and even dangerous. Physical Activity  Exercise regularly, as told by your health care provider. Ask your health care provider what types of exercise are safe for you and how often you should exercise.  Warm up and stretch before being active.  Cool down and stretch after being active.  Rest between periods of activity. Lifestyle  Limit the time that you spend in front of your TV, computer, or video game system.  Find ways to reward yourself that do not involve food.  Limit alcohol intake to no more than 1 drink a day for nonpregnant women and 2 drinks a day for men. One drink equals 12 oz of beer, 5 oz of wine, or 1 oz of hard liquor. General instructions  Keep a weight loss journal to keep track of the food  you eat and how much you exercise you get.  Take over-the-counter and prescription medicines only as told by your health care provider.  Take vitamins and supplements only as told by your health care provider.  Consider joining a support group. Your health care provider may be able to recommend a support group.  Keep all follow-up visits as told by your health care provider. This is important. Contact a health care provider if:  You are unable to meet your weight loss goal after 6 weeks of dietary and lifestyle changes. This information is not intended to replace advice given to you by your health care provider. Make sure you discuss any questions you have with your health care provider. Document Released: 01/16/2005 Document Revised: 05/13/2016 Document Reviewed: 09/27/2015 Elsevier Interactive Patient Education  2018 Else[employer] Inc.  Preventing Unhealthy [employer], Adult Staying at a healthy weight is important. When fat builds up in your body, you may become overweight or obese. These conditions put you at greater risk for developing certain health problems, such as heart disease, diabetes, sleeping problems, joint problems, and  some cancers. Unhealthy weight gain is often the result of making unhealthy choices in what you eat. It is also a result of not getting enough exercise. You can make changes to your lifestyle to prevent obesity and stay as healthy as possible. What nutrition changes can be made? To maintain a healthy weight and prevent obesity:  Eat only as much as your body needs. To do this: ? Pay attention to signs that you are hungry or full. Stop eating as soon as you feel full. ? If you feel hungry, try drinking water first. Drink enough water so your urine is clear or pale yellow. ? Eat smaller portions. ? Look at serving sizes on food labels. Most foods contain more than one serving per container. ? Eat the recommended amount of calories for your gender and activity level. While most active people should eat around 2,000 calories per day, if you are trying to lose weight or are not very active, you main need to eat less calories. Talk to your health care provider or dietitian about how many calories you should eat each day.  Choose healthy foods, such as: ? Fruits and vegetables. Try to fill at least half of your plate at each meal with fruits and vegetables. ? Whole grains, such as whole wheat bread, brown rice, and quinoa. ? Lean meats, such as chicken or fish. ? Other healthy proteins, such as beans, eggs, or tofu. ? Healthy fats, such as nuts, seeds, fatty fish, and olive oil. ? Low-fat or fat-free dairy.  Check food labels and avoid food and drinks that: ? Are high in calories. ? Have added sugar. ? Are high in sodium. ? Have saturated fats or trans fats.  Limit how much you eat of the following foods: ? Prepackaged meals. ? Fast food. ? Fried foods. ? Processed meat, such as bacon, sausage, and deli meats. ? Fatty cuts of red meat and poultry with skin.  Cook foods in healthier ways, such as by baking, broiling, or grilling.  When grocery shopping, try to shop around the outside of  the store. This helps you buy mostly fresh foods and avoid canned and prepackaged foods.  What lifestyle changes can be made?  Exercise at least 30 minutes 5 or more days each week. Exercising includes brisk walking, yard work, biking, running, swimming, and team sports like basketball and soccer. Ask your health care provider  which exercises are safe for you.  Do not use any products that contain nicotine or tobacco, such as cigarettes and e-cigarettes. If you need help quitting, ask your health care provider.  Limit alcohol intake to no more than 1 drink a day for nonpregnant women and 2 drinks a day for men. One drink equals 12 oz of beer, 5 oz of wine, or 1 oz of hard liquor.  Try to get 7-9 hours of sleep each night. What other changes can be made?  Keep a food and activity journal to keep track of: ? What you ate and how many calories you had. Remember to count sauces, dressings, and side dishes. ? Whether you were active, and what exercises you did. ? Your calorie, weight, and activity goals.  Check your weight regularly. Track any changes. If you notice you have gained weight, make changes to your diet or activity routine.  Avoid taking weight-loss medicines or supplements. Talk to your health care provider before starting any new medicine or supplement.  Talk to your health care provider before trying any new diet or exercise plan. Why are these changes important? Eating healthy, staying active, and having healthy habits not only help prevent obesity, they also:  Help you to manage stress and emotions.  Help you to connect with friends and family.  Improve your self-esteem.  Improve your sleep.  Prevent long-term health problems.  What can happen if changes are not made? Being obese or overweight can cause you to develop joint or bone problems, which can make it hard for you to stay active or do activities you enjoy. Being obese or overweight also puts stress on your  heart and lungs and can lead to health problems like diabetes, heart disease, and some cancers. Where to find more information: Talk with your health care provider or a dietitian about healthy eating and healthy lifestyle choices. You may also find other information through these resources:  U.S. Department of Agriculture MyPlate: https://ball-collins.biz/  American Heart Association: www.heart.org  Centers for Disease Control and Prevention: FootballExhibition.com.br  Summary  Staying at a healthy weight is important. It helps prevent certain diseases and health problems, such as heart disease, diabetes, joint problems, sleep disorders, and some cancers.  Being obese or overweight can cause you to develop joint or bone problems, which can make it hard for you to stay active or do activities you enjoy.  You can prevent unhealthy weight gain by eating a healthy diet, exercising regularly, not smoking, limiting alcohol, and getting enough sleep.  Talk with your health care provider or a dietitian for guidance about healthy eating and healthy lifestyle choices. This information is not intended to replace advice given to you by your health care provider. Make sure you discuss any questions you have with your health care provider. Document Released: 12/10/2016 Document Revised: 01/15/2017 Document Reviewed: 01/15/2017 Elsevier Interactive Patient Education  [employer].

## 2018-04-09 NOTE — Assessment & Plan Note (Signed)
Check labs 

## 2018-04-09 NOTE — Assessment & Plan Note (Signed)
>>  ASSESSMENT AND PLAN FOR HYPERTENSION WRITTEN ON 04/09/2018  3:20 PM BY LADA, MELINDA P, MD  Gorgeous; continue medicine; check cr and -lytes; try DASH guidelines

## 2018-04-09 NOTE — Assessment & Plan Note (Signed)
Gorgeous; continue medicine; check cr and -lytes; try DASH guidelines
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
## 2018-04-09 NOTE — Assessment & Plan Note (Addendum)
Check lipids; healthier eating, weight loss important

## 2018-04-10 LAB — LIPID PANEL
CHOLESTEROL TOTAL: 163 mg/dL (ref 100–199)
Chol/HDL Ratio: 3.2 ratio (ref 0.0–4.4)
HDL: 51 mg/dL (ref >39)
LDL Calculated: 88 mg/dL (ref 0–99)
TRIGLYCERIDES: 121 mg/dL (ref 0–149)
VLDL CHOLESTEROL CAL: 24 mg/dL (ref 5–40)

## 2018-04-10 LAB — HEMOGLOBIN A1C
ESTIMATED AVERAGE GLUCOSE: 131 mg/dL
Hgb A1c MFr Bld: 6.2 % — ABNORMAL HIGH (ref 4.8–5.6)

## 2018-04-10 LAB — COMPREHENSIVE METABOLIC PANEL
ALBUMIN: 4.2 g/dL (ref 3.5–5.5)
ALK PHOS: 69 IU/L (ref 39–117)
ALT: 21 IU/L (ref 0–32)
AST: 21 IU/L (ref 0–40)
Albumin/Globulin Ratio: 1.4 (ref 1.2–2.2)
BILIRUBIN TOTAL: 0.2 mg/dL (ref 0.0–1.2)
BUN / CREAT RATIO: 18 (ref 9–23)
BUN: 14 mg/dL (ref 6–24)
CO2: 26 mmol/L (ref 20–29)
CREATININE: 0.76 mg/dL (ref 0.57–1.00)
Calcium: 9.8 mg/dL (ref 8.7–10.2)
Chloride: 102 mmol/L (ref 96–106)
GFR calc non Af Amer: 90 mL/min/{1.73_m2} (ref >59)
GFR, EST AFRICAN AMERICAN: 104 mL/min/{1.73_m2} (ref >59)
GLOBULIN, TOTAL: 2.9 g/dL (ref 1.5–4.5)
Glucose: 104 mg/dL — ABNORMAL HIGH (ref 65–99)
Potassium: 3.9 mmol/L (ref 3.5–5.2)
SODIUM: 143 mmol/L (ref 134–144)
TOTAL PROTEIN: 7.1 g/dL (ref 6.0–8.5)

## 2018-04-10 LAB — VITAMIN D 25 HYDROXY (VIT D DEFICIENCY, FRACTURES): VIT D 25 HYDROXY: 39.8 ng/mL (ref 30.0–100.0)

## 2018-04-10 LAB — TSH: TSH: 1.51 u[IU]/mL (ref 0.450–4.500)

## 2018-04-10 LAB — CBC
HEMATOCRIT: 37.7 % (ref 34.0–46.6)
Hemoglobin: 12.3 g/dL (ref 11.1–15.9)
MCH: 25.8 pg — AB (ref 26.6–33.0)
MCHC: 32.6 g/dL (ref 31.5–35.7)
MCV: 79 fL (ref 79–97)
Platelets: 219 10*3/uL (ref 150–379)
RBC: 4.77 x10E6/uL (ref 3.77–5.28)
RDW: 15.1 % (ref 12.3–15.4)
WBC: 8 10*3/uL (ref 3.4–10.8)

## 2018-04-10 LAB — FERRITIN: Ferritin: 105 ng/mL (ref 15–150)

## 2018-04-11 ENCOUNTER — Encounter: Payer: Self-pay | Admitting: Family Medicine

## 2018-04-11 DIAGNOSIS — R7303 Prediabetes: Secondary | ICD-10-CM | POA: Insufficient documentation

## 2018-04-26 DIAGNOSIS — Z6841 Body Mass Index (BMI) 40.0 and over, adult: Secondary | ICD-10-CM | POA: Insufficient documentation

## 2018-04-26 NOTE — Assessment & Plan Note (Signed)
Weight loss important to help prevent/slow progression to outright type 2 dm

## 2018-04-26 NOTE — Assessment & Plan Note (Signed)
Encouraged weight loss; see AVS 

## 2018-04-30 ENCOUNTER — Ambulatory Visit: Payer: Self-pay | Admitting: *Deleted

## 2018-04-30 NOTE — Telephone Encounter (Signed)
  Patient believes she has ear wax build up or congestion. She is beginning to have balance problems. She has had ear wax build up in the past but is afraid to use OTC product. Appointment given- patient contact is [phone] if she can be seen sooner that Monday appointment. Reason for Disposition . Ear congestion present > 48 hours  Answer Assessment - Initial Assessment Questions 1. LOCATION: "Which ear is involved?"       bilateral ear pressure 2. SENSATION: "Describe how the ear feels."      Pressure- feels clogged 3. ONSET:  "When did the ear symptoms start?"       Few days 4. PAIN: "Do you also have an earache?" If so, ask: "How bad is it?" (Scale 1-10; or mild, moderate, severe)     no 5. CAUSE: "What do you think is causing the ear congestion?"     Ear wax build up 6. URI: "Do you have a runny nose or cough?"      no 7. NASAL ALLERGIES: "Are there symptoms of hay fever, such as sneezing or a clear nasal discharge?"     no 8. PREGNANCY: "Is there any chance you are pregnant?" "When was your last menstrual period?"     n/a  Protocols used: EAR - CONGESTION-A-AH

## 2018-04-30 NOTE — Telephone Encounter (Signed)
Spoke to patient and she made appointment for 5/13 at 11:40

## 2018-05-04 ENCOUNTER — Encounter: Payer: Self-pay | Admitting: Nurse Practitioner

## 2018-05-04 ENCOUNTER — Ambulatory Visit (INDEPENDENT_AMBULATORY_CARE_PROVIDER_SITE_OTHER): Payer: 59 | Admitting: Nurse Practitioner

## 2018-05-04 VITALS — BP 112/72 | HR 94 | Temp 98.5°F | Resp 16 | Ht 65.0 in | Wt 308.4 lb

## 2018-05-04 DIAGNOSIS — F419 Anxiety disorder, unspecified: Secondary | ICD-10-CM

## 2018-05-04 DIAGNOSIS — R42 Dizziness and giddiness: Secondary | ICD-10-CM

## 2018-05-04 MED ORDER — MECLIZINE HCL 12.5 MG PO TABS: 12.5000 mg | ORAL_TABLET | Freq: Three times a day (TID) | ORAL | 0 refills | Status: DC | PRN

## 2018-05-04 NOTE — Patient Instructions (Addendum)
-   Go back to your regular dose of  of citalopram - If symptoms are not improving, I have sent a rx for meclizine that you can take up to three times a day as needed as well as referred to physical therapy as well to help with symptoms - Do recommend going to counseling and then in a few months we can discuss getting off of medication  Warning signs: - passing out or almost passing out - Slurred speech - facial droop - weakness on one side of your body  - numbness or tingling on one side - difficulty waking Please seek immediate medical attention

## 2018-05-04 NOTE — Progress Notes (Addendum)
Name: Christine Michael   MRN: [id number]    DOB: October 28, 1966   Date:05/04/2018       Progress Note  Subjective  Chief Complaint  Chief Complaint  Patient presents with  . Ear Fullness    bilateral ears for 1 week. Ears pops when swallowing    HPI  Patient states has been hearing popping noise occasionally when you swallow, feels a little loopy like eye balls are rolling when she turns suddenly. States feels what she is seeing is spinning. No unsteady gait or falls, no lightheadedness/near syncope, no hearing loss, tinnitus. States has felt this previously and had too much wax build up in ears- once irrigated resolved. Patient states has several episodes day each lasting a few seconds. Typically noticed when turning head. Denies slurred speech, weakness, neck stiffness (sts has neck tension chronic, no change), aphasia.   Pt sts does have anxiety was titration off of citalopram is taking  dose- been on this dose for the past month. States noticed symptoms started when she decreased her dose. States overall notes a little bit more anxiety.  GAD 7 : Generalized Anxiety Score 05/04/2018  Nervous, Anxious, on Edge 2  Control/stop worrying 0  Worry too much - different things 0  Trouble relaxing 1  Restless 1  Easily annoyed or irritable 2  Afraid - awful might happen 0  Total GAD 7 Score 6  Anxiety Difficulty Not difficult at all        Patient Active Problem List   Diagnosis Date Noted  . Morbid obesity (HCC) 04/26/2018  . Prediabetes 04/11/2018  . Medication monitoring encounter 04/09/2018  . Anxiety 02/07/2017  . Left foot pain 01/29/2017  . Well woman exam with routine gynecological exam 07/29/2016  . Family history of breast cancer in mother 07/29/2016  . BPPV (benign paroxysmal positional vertigo) 06/10/2016  . Hypertension 09/12/2015  . High cholesterol 09/12/2015  . Hyperglycemia 09/12/2015  . Annual physical exam 08/08/2015  . 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 Anxiety   . High cholesterol   . Hypertension     Past Surgical History:  Procedure Laterality Date  . ABDOMINAL HYSTERECTOMY  2010   partial  . COLONOSCOPY  2015   [facility]  . SALPINGOOPHORECTOMY Bilateral 07/24/2015   Procedure: ATTEMPTED LAPAROSCOPY, ABDOMINAL BILATERAL SALPINGO OOPHORECTOMY ;  Surgeon: Hildred Laser, MD;  Location: [facility] OR[facility];  Service: Gynecology;  Laterality: Bilateral;  . TONSILLECTOMY    . TUBAL LIGATION Bilateral 1988    Social History   Tobacco Use  . Smoking status: Former Smoker    Last attempt to quit: 07/12/2008    Years since quitting: 9.8  . Smokeless tobacco: Never Used  Substance Use Topics  . Alcohol use: Yes    Alcohol/week: 0.0 - 0.6 oz     Current Outpatient Medications:  .  atorvastatin (LIPITOR) 20 MG tablet, Take 1 tablet (20 mg total) by mouth at bedtime., Disp: 90 tablet, Rfl: 0 .  Cholecalciferol (VITAMIN D PO), Take 1,000 Units by mouth 2 (two) times daily., Disp: , Rfl:  .  citalopram (CELEXA) 20 MG tablet, Take 0.5 tablets (10 mg total) by mouth daily. For four weeks, then stop, Disp: , Rfl:  .  triamterene-hydrochlorothiazide (MAXZIDE-25) 37.5-25 MG tablet, Take 1 tablet by mouth daily., Disp: 90 tablet, Rfl: 0  No Known Allergies  ROS  No other specific complaints in a complete review of systems (except as listed in  HPI above).  Objective  Vitals:   05/04/18 1135  BP: 112/72  Pulse: 94  Resp: 16  Temp: 98.5 F (36.9 C)  TempSrc: Oral  SpO2: 96%  Weight: (!) 308 lb 6.4 oz (139.9 kg)  Height:  (1.651 m)     Body mass index is 51.32 kg/m.  Nursing Note and Vital Signs reviewed.  Physical Exam  Constitutional: Patient appears well-developed and well-nourished. Obese  No distress.  HEENT: head atraumatic, normocephalic, pupils equal and reactive to light, TM's without erythema or bulging, no maxillary or frontal sinus tenderness , neck supple without  lymphadenopathy, oropharynx pink and moist without exudate, no nasal discharge Cardiovascular: Normal rate, regular rhythm, S1/S2 present.  No murmur or rub heard.  Pulmonary/Chest: Effort normal and breath sounds clear. No respiratory distress or retractions. Neuro: a&ox3, no nystagmus, equal strength, no sensation deficit, DTR WNL, + romberg, no limb ataxia normal rapid alternating movements  Psychiatric: Patient has a normal mood and affect. behavior is normal. Judgment and thought content normal.  No results found for this or any previous visit (from the past 72 hour(s)).  Assessment & Plan  1. Anxiety - increase dose back to  - Counseling   2. Vertigo - meclizine (ANTIVERT) 12.5 MG tablet; Take 1 tablet (12.5 mg total) by mouth 3 (three) times daily as needed for dizziness.  Dispense: 30 tablet; Refill: 0 - Ambulatory referral to Physical Therapy   -Red flags and when to present for emergency care or RTC including fever >101.65F, chest pain, shortness of breath, stroke symptoms, syncope, gait changes new/worsening/un-resolving symptoms, reviewed with patient at time of visit. Follow up and care instructions discussed and provided in AVS.  ----------------------------------------------- I have reviewed this encounter including the documentation in this note and/or discussed this patient with the provider, Sharyon Cable DNP AGNP-C. I am certifying that I agree with the content of this note as supervising physician. Baruch Gouty, MD [facility] 05/20/2018, 5:06 PM

## 2018-05-05 ENCOUNTER — Other Ambulatory Visit: Payer: Self-pay | Admitting: Family Medicine

## 2018-05-05 DIAGNOSIS — F419 Anxiety disorder, unspecified: Secondary | ICD-10-CM

## 2018-05-16 ENCOUNTER — Other Ambulatory Visit: Payer: Self-pay | Admitting: Family Medicine

## 2018-05-16 DIAGNOSIS — I10 Essential (primary) hypertension: Secondary | ICD-10-CM

## 2018-05-18 ENCOUNTER — Other Ambulatory Visit: Payer: Self-pay | Admitting: Family Medicine

## 2018-05-18 DIAGNOSIS — E7800 Pure hypercholesterolemia, unspecified: Secondary | ICD-10-CM

## 2018-06-17 ENCOUNTER — Other Ambulatory Visit: Payer: Self-pay | Admitting: Family Medicine

## 2018-06-17 ENCOUNTER — Telehealth: Payer: Self-pay | Admitting: Family Medicine

## 2018-06-17 DIAGNOSIS — F419 Anxiety disorder, unspecified: Secondary | ICD-10-CM

## 2018-06-17 DIAGNOSIS — E7800 Pure hypercholesterolemia, unspecified: Secondary | ICD-10-CM

## 2018-06-17 NOTE — Telephone Encounter (Signed)
Copied from CRM [phone]. Topic: Quick Communication - See Telephone Encounter >> Jun 17, 2018 11:32 AM Mare LoanBurton, Donna F wrote: Pt is needing a refill on citalopram and atorvastatin to [facility]   Best number [phone]

## 2018-06-18 NOTE — Telephone Encounter (Signed)
Addressed by Dr. Sherie DonLada; pt aware

## 2018-06-26 MED ORDER — CITALOPRAM HYDROBROMIDE 20 MG PO TABS: 20.0000 mg | ORAL_TABLET | Freq: Every day | ORAL | 1 refills | Status: DC

## 2018-06-26 NOTE — Telephone Encounter (Signed)
I reviewed Christine Michael's last note; new Rx sent for 20 mg; thank you (I actually sent locally AND to mail order in case she needed some now)

## 2018-06-26 NOTE — Telephone Encounter (Signed)
Patient has been informed.

## 2018-06-26 NOTE — Telephone Encounter (Signed)
I do not see this medication (it was discontinued on 06/20/18) on her current medication list, is this something that was discussed during her last office visit?

## 2018-06-26 NOTE — Telephone Encounter (Signed)
Relation to pt: self  Call back [phone] [facility] [place] [id number] [place] [phone] (Phone) [phone] (Fax)    Reason for call:  Patient checking on the status of citalopram (CELEXA) 20 MG tablet refill request. Patient states PCP aware patient had difficulties weaning  off medication due to the side effects, patient would like PCP to send [facility] before she runs out, please advise

## 2018-06-29 ENCOUNTER — Other Ambulatory Visit: Payer: Self-pay | Admitting: Family Medicine

## 2018-06-29 DIAGNOSIS — E7800 Pure hypercholesterolemia, unspecified: Secondary | ICD-10-CM

## 2018-06-29 DIAGNOSIS — F419 Anxiety disorder, unspecified: Secondary | ICD-10-CM

## 2018-06-29 NOTE — Telephone Encounter (Signed)
Copied from CRM [id number]. Topic: Quick Communication - Rx Refill/Question >> Jun 29, 2018 11:35 AM Luanna Coleawoud, Jessica L wrote: Medication:citalopram (CELEXA) 20 MG tablet  medication sent to wrong pharmacy.   Has the patient contacted their pharmacy? Yes  Preferred Pharmacy (with phone number or street name):[facility]   Agent: Please be advised that RX refills may take up to 3 business days. We ask that you follow-up with your pharmacy.

## 2018-06-29 NOTE — Telephone Encounter (Signed)
Please resend celexa to [facility]

## 2018-06-29 NOTE — Telephone Encounter (Signed)
Copied from CRM #[id number]. Topic: Quick Communication - Rx Refill/Question °>> Jun 29, 2018 11:35 AM Dawoud, Jessica L wrote: °Medication:citalopram (CELEXA) 20 MG tablet  medication sent to wrong pharmacy.  ° °Has the patient contacted their pharmacy? Yes  °Preferred Pharmacy (with phone number or street name):[facility]  ° °Agent: Please be advised that RX refills may take up to 3 business days. We ask that you follow-up with your pharmacy. °

## 2018-07-15 ENCOUNTER — Encounter: Payer: Self-pay | Admitting: Family Medicine

## 2018-07-15 ENCOUNTER — Ambulatory Visit (INDEPENDENT_AMBULATORY_CARE_PROVIDER_SITE_OTHER): Payer: 59 | Admitting: Family Medicine

## 2018-07-15 VITALS — BP 130/80 | HR 76 | Temp 98.0°F | Resp 16 | Ht 65.0 in | Wt 305.0 lb

## 2018-07-15 DIAGNOSIS — Z0001 Encounter for general adult medical examination with abnormal findings: Secondary | ICD-10-CM

## 2018-07-15 DIAGNOSIS — Z23 Encounter for immunization: Secondary | ICD-10-CM | POA: Diagnosis not present

## 2018-07-15 DIAGNOSIS — R7303 Prediabetes: Secondary | ICD-10-CM | POA: Diagnosis not present

## 2018-07-15 DIAGNOSIS — L304 Erythema intertrigo: Secondary | ICD-10-CM | POA: Insufficient documentation

## 2018-07-15 DIAGNOSIS — Z0000 Encounter for general adult medical examination without abnormal findings: Secondary | ICD-10-CM | POA: Diagnosis not present

## 2018-07-15 DIAGNOSIS — Z789 Other specified health status: Secondary | ICD-10-CM

## 2018-07-15 MED ORDER — NYSTATIN-TRIAMCINOLONE 100000-0.1 UNIT/GM-% EX OINT: 1.0000 "application " | TOPICAL_OINTMENT | Freq: Two times a day (BID) | CUTANEOUS | 2 refills | Status: DC

## 2018-07-15 NOTE — Assessment & Plan Note (Signed)
Encouraged weight loss; asked about any barriers; "myself" she stated; see AVS

## 2018-07-15 NOTE — Patient Instructions (Addendum)
Check out the information at familydoctor.org entitled "Nutrition for Weight Loss: What You Need to Know about Fad Diets" Try to lose between 1-2 pounds per week by taking in fewer calories and burning off more calories You can succeed by limiting portions, limiting foods dense in calories and fat, becoming more active, and drinking 8 glasses of water a day (64 ounces) Don't skip meals, especially breakfast, as skipping meals may alter your metabolism Do not use over-the-counter weight loss pills or gimmicks that claim rapid weight loss A healthy BMI (or body mass index) is between 18.5 and 24.9 You can calculate your ideal BMI at the [place] website [email or web address].fr  Health Maintenance, Female Adopting a healthy lifestyle and getting preventive care can go a long way to promote health and wellness. Talk with your health care provider about what schedule of regular examinations is right for you. This is a good chance for you to check in with your provider about disease prevention and staying healthy. In between checkups, there are plenty of things you can do on your own. Experts have done a lot of research about which lifestyle changes and preventive measures are most likely to keep you healthy. Ask your health care provider for more information. Weight and diet Eat a healthy diet  Be sure to include plenty of vegetables, fruits, low-fat dairy products, and lean protein.  Do not eat a lot of foods high in solid fats, added sugars, or salt.  Get regular exercise. This is one of the most important things you can do for your health. ? Most adults should exercise for at least 150 minutes each week. The exercise should increase your heart rate and make you sweat (moderate-intensity exercise). ? Most adults should also do strengthening exercises at least twice a week. This is in addition to the moderate-intensity exercise.  Maintain a healthy  weight  Body mass index (BMI) is a measurement that can be used to identify possible weight problems. It estimates body fat based on height and weight. Your health care provider can help determine your BMI and help you achieve or maintain a healthy weight.  For females 49 years of age and older: ? A BMI below 18.5 is considered underweight. ? A BMI of 18.5 to 24.9 is normal. ? A BMI of 25 to 29.9 is considered overweight. ? A BMI of 30 and above is considered obese.  Watch levels of cholesterol and blood lipids  You should start having your blood tested for lipids and cholesterol at 54 years of age, then have this test every 5 years.  You may need to have your cholesterol levels checked more often if: ? Your lipid or cholesterol levels are high. ? You are older than 54 years of age. ? You are at high risk for heart disease.  Cancer screening Lung Cancer  Lung cancer screening is recommended for adults 22-66 years old who are at high risk for lung cancer because of a history of smoking.  A yearly low-dose CT scan of the lungs is recommended for people who: ? Currently smoke. ? Have quit within the past 15 years. ? Have at least a 30-pack-year history of smoking. A pack year is smoking an average of one pack of cigarettes a day for 1 year.  Yearly screening should continue until it has been 15 years since you quit.  Yearly screening should stop if you develop a health problem that would prevent you from having lung cancer treatment.  Breast Cancer  Practice breast self-awareness. This means understanding how your breasts normally appear and feel.  It also means doing regular breast self-exams. Let your health care provider know about any changes, no matter how small.  If you are in your 20s or 30s, you should have a clinical breast exam (CBE) by a health care provider every 1-3 years as part of a regular health exam.  If you are 18 or older, have a CBE every year. Also consider  having a breast X-ray (mammogram) every year.  If you have a family history of breast cancer, talk to your health care provider about genetic screening.  If you are at high risk for breast cancer, talk to your health care provider about having an MRI and a mammogram every year.  Breast cancer gene (BRCA) assessment is recommended for women who have family members with BRCA-related cancers. BRCA-related cancers include: ? Breast. ? Ovarian. ? Tubal. ? Peritoneal cancers.  Results of the assessment will determine the need for genetic counseling and BRCA1 and BRCA2 testing.  Cervical Cancer Your health care provider may recommend that you be screened regularly for cancer of the pelvic organs (ovaries, uterus, and vagina). This screening involves a pelvic examination, including checking for microscopic changes to the surface of your cervix (Pap test). You may be encouraged to have this screening done every 3 years, beginning at age 54.  For women ages 41-65, health care providers may recommend pelvic exams and Pap testing every 3 years, or they may recommend the Pap and pelvic exam, combined with testing for human papilloma virus (HPV), every 5 years. Some types of HPV increase your risk of cervical cancer. Testing for HPV may also be done on women of any age with unclear Pap test results.  Other health care providers may not recommend any screening for nonpregnant women who are considered low risk for pelvic cancer and who do not have symptoms. Ask your health care provider if a screening pelvic exam is right for you.  If you have had past treatment for cervical cancer or a condition that could lead to cancer, you need Pap tests and screening for cancer for at least 20 years after your treatment. If Pap tests have been discontinued, your risk factors (such as having a new sexual partner) need to be reassessed to determine if screening should resume. Some women have medical problems that increase  the chance of getting cervical cancer. In these cases, your health care provider may recommend more frequent screening and Pap tests.  Colorectal Cancer  This type of cancer can be detected and often prevented.  Routine colorectal cancer screening usually begins at 54 years of age and continues through 54 years of age.  Your health care provider may recommend screening at an earlier age if you have risk factors for colon cancer.  Your health care provider may also recommend using home test kits to check for hidden blood in the stool.  A small camera at the end of a tube can be used to examine your colon directly (sigmoidoscopy or colonoscopy). This is done to check for the earliest forms of colorectal cancer.  Routine screening usually begins at age 23.  Direct examination of the colon should be repeated every 5-10 years through 54 years of age. However, you may need to be screened more often if early forms of precancerous polyps or small growths are found.  Skin Cancer  Check your skin from head to toe regularly.  Tell your health care  provider about any new moles or changes in moles, especially if there is a change in a mole's shape or color.  Also tell your health care provider if you have a mole that is larger than the size of a pencil eraser.  Always use sunscreen. Apply sunscreen liberally and repeatedly throughout the day.  Protect yourself by wearing long sleeves, pants, a wide-brimmed hat, and sunglasses whenever you are outside.  Heart disease, diabetes, and high blood pressure  High blood pressure causes heart disease and increases the risk of stroke. High blood pressure is more likely to develop in: ? People who have blood pressure in the high end of the normal range (130-139/85-89 mm Hg). ? People who are overweight or obese. ? People who are African American.  If you are 55-64 years of age, have your blood pressure checked every 3-5 years. If you are 10 years of age  or older, have your blood pressure checked every year. You should have your blood pressure measured twice-once when you are at a hospital or clinic, and once when you are not at a hospital or clinic. Record the average of the two measurements. To check your blood pressure when you are not at a hospital or clinic, you can use: ? An automated blood pressure machine at a pharmacy. ? A home blood pressure monitor.  If you are between 65 years and 57 years old, ask your health care provider if you should take aspirin to prevent strokes.  Have regular diabetes screenings. This involves taking a blood sample to check your fasting blood sugar level. ? If you are at a normal weight and have a low risk for diabetes, have this test once every three years after 54 years of age. ? If you are overweight and have a high risk for diabetes, consider being tested at a younger age or more often. Preventing infection Hepatitis B  If you have a higher risk for hepatitis B, you should be screened for this virus. You are considered at high risk for hepatitis B if: ? You were born in a country where hepatitis B is common. Ask your health care provider which countries are considered high risk. ? Your parents were born in a high-risk country, and you have not been immunized against hepatitis B (hepatitis B vaccine). ? You have HIV or AIDS. ? You use needles to inject street drugs. ? You live with someone who has hepatitis B. ? You have had sex with someone who has hepatitis B. ? You get hemodialysis treatment. ? You take certain medicines for conditions, including cancer, organ transplantation, and autoimmune conditions.  Hepatitis C  Blood testing is recommended for: ? Everyone born from [birth year] through 1967. ? Anyone with known risk factors for hepatitis C.  Sexually transmitted infections (STIs)  You should be screened for sexually transmitted infections (STIs) including gonorrhea and chlamydia if: ? You are  sexually active and are younger than 54 years of age. ? You are older than 54 years of age and your health care provider tells you that you are at risk for this type of infection. ? Your sexual activity has changed since you were last screened and you are at an increased risk for chlamydia or gonorrhea. Ask your health care provider if you are at risk.  If you do not have HIV, but are at risk, it may be recommended that you take a prescription medicine daily to prevent HIV infection. This is called pre-exposure prophylaxis (PrEP). You  are considered at risk if: ? You are sexually active and do not regularly use condoms or know the HIV status of your partner(s). ? You take drugs by injection. ? You are sexually active with a partner who has HIV.  Talk with your health care provider about whether you are at high risk of being infected with HIV. If you choose to begin PrEP, you should first be tested for HIV. You should then be tested every 3 months for as long as you are taking PrEP. Pregnancy  If you are premenopausal and you may become pregnant, ask your health care provider about preconception counseling.  If you may become pregnant, take 400 to 800 micrograms (mcg) of folic acid every day.  If you want to prevent pregnancy, talk to your health care provider about birth control (contraception). Osteoporosis and menopause  Osteoporosis is a disease in which the bones lose minerals and strength with aging. This can result in serious bone fractures. Your risk for osteoporosis can be identified using a bone density scan.  If you are 56 years of age or older, or if you are at risk for osteoporosis and fractures, ask your health care provider if you should be screened.  Ask your health care provider whether you should take a calcium or vitamin D supplement to lower your risk for osteoporosis.  Menopause may have certain physical symptoms and risks.  Hormone replacement therapy may reduce some  of these symptoms and risks. Talk to your health care provider about whether hormone replacement therapy is right for you. Follow these instructions at home:  Schedule regular health, dental, and eye exams.  Stay current with your immunizations.  Do not use any tobacco products including cigarettes, chewing tobacco, or electronic cigarettes.  If you are pregnant, do not drink alcohol.  If you are breastfeeding, limit how much and how often you drink alcohol.  Limit alcohol intake to no more than 1 drink per day for nonpregnant women. One drink equals 12 ounces of beer, 5 ounces of wine, or 1 ounces of hard liquor.  Do not use street drugs.  Do not share needles.  Ask your health care provider for help if you need support or information about quitting drugs.  Tell your health care provider if you often feel depressed.  Tell your health care provider if you have ever been abused or do not feel safe at home. This information is not intended to replace advice given to you by your health care provider. Make sure you discuss any questions you have with your health care provider. Document Released: 06/24/2011 Document Revised: 05/16/2016 Document Reviewed: 09/12/2015 Elsevier Interactive Patient Education  2018 [employer].  Obesity, Adult Obesity is the condition of having too much total body fat. Being overweight or obese means that your weight is greater than what is considered healthy for your body size. Obesity is determined by a measurement called BMI. BMI is an estimate of body fat and is calculated from height and weight. For adults, a BMI of 30 or higher is considered obese. Obesity can eventually lead to other health concerns and major illnesses, including:  Stroke.  Coronary artery disease (CAD).  Type 2 diabetes.  Some types of cancer, including cancers of the colon, breast, uterus, and gallbladder.  Osteoarthritis.  High blood pressure (hypertension).  High  cholesterol.  Sleep apnea.  Gallbladder stones.  Infertility problems.  What are the causes? The main cause of obesity is taking in (consuming) more calories than  your body uses for energy. Other factors that contribute to this condition may include:  Being born with genes that make you more likely to become obese.  Having a medical condition that causes obesity. These conditions include: ? Hypothyroidism. ? Polycystic ovarian syndrome (PCOS). ? Binge-eating disorder. ? Cushing syndrome.  Taking certain medicines, such as steroids, antidepressants, and seizure medicines.  Not being physically active (sedentary lifestyle).  Living where there are limited places to exercise safely or buy healthy foods.  Not getting enough sleep.  What increases the risk? The following factors may increase your risk of this condition:  Having a family history of obesity.  Being a woman of African-American descent.  Being a man of Hispanic descent.  What are the signs or symptoms? Having excessive body fat is the main symptom of this condition. How is this diagnosed? This condition may be diagnosed based on:  Your symptoms.  Your medical history.  A physical exam. Your health care provider may measure: ? Your BMI. If you are an adult with a BMI between 25 and less than 30, you are considered overweight. If you are an adult with a BMI of 30 or higher, you are considered obese. ? The distances around your hips and your waist (circumferences). These may be compared to each other to help diagnose your condition. ? Your skinfold thickness. Your health care provider may gently pinch a fold of your skin and measure it.  How is this treated? Treatment for this condition often includes changing your lifestyle. Treatment may include some or all of the following:  Dietary changes. Work with your health care provider and a dietitian to set a weight-loss goal that is healthy and reasonable for  you. Dietary changes may include eating: ? Smaller portions. A portion size is the amount of a particular food that is healthy for you to eat at one time. This varies from person to person. ? Low-calorie or low-fat options. ? More whole grains, fruits, and vegetables.  Regular physical activity. This may include aerobic activity (cardio) and strength training.  Medicine to help you lose weight. Your health care provider may prescribe medicine if you are unable to lose 1 pound a week after 6 weeks of eating more healthily and doing more physical activity.  Surgery. Surgical options may include gastric banding and gastric bypass. Surgery may be done if: ? Other treatments have not helped to improve your condition. ? You have a BMI of 40 or higher. ? You have life-threatening health problems related to obesity.  Follow these instructions at home:  Eating and drinking   Follow recommendations from your health care provider about what you eat and drink. Your health care provider may advise you to: ? Limit fast foods, sweets, and processed snack foods. ? Choose low-fat options, such as low-fat milk instead of whole milk. ? Eat 5 or more servings of fruits or vegetables every day. ? Eat at home more often. This gives you more control over what you eat. ? Choose healthy foods when you eat out. ? Learn what a healthy portion size is. ? Keep low-fat snacks on hand. ? Avoid sugary drinks, such as soda, fruit juice, iced tea sweetened with sugar, and flavored milk. ? Eat a healthy breakfast.  Drink enough water to keep your urine clear or pale yellow.  Do not go without eating for long periods of time (do not fast) or follow a fad diet. Fasting and fad diets can be unhealthy and even  dangerous. Physical Activity  Exercise regularly, as told by your health care provider. Ask your health care provider what types of exercise are safe for you and how often you should exercise.  Warm up and  stretch before being active.  Cool down and stretch after being active.  Rest between periods of activity. Lifestyle  Limit the time that you spend in front of your TV, computer, or video game system.  Find ways to reward yourself that do not involve food.  Limit alcohol intake to no more than 1 drink a day for nonpregnant women and 2 drinks a day for men. One drink equals 12 oz of beer, 5 oz of wine, or 1 oz of hard liquor. General instructions  Keep a weight loss journal to keep track of the food you eat and how much you exercise you get.  Take over-the-counter and prescription medicines only as told by your health care provider.  Take vitamins and supplements only as told by your health care provider.  Consider joining a support group. Your health care provider may be able to recommend a support group.  Keep all follow-up visits as told by your health care provider. This is important. Contact a health care provider if:  You are unable to meet your weight loss goal after 6 weeks of dietary and lifestyle changes. This information is not intended to replace advice given to you by your health care provider. Make sure you discuss any questions you have with your health care provider. Document Released: 01/16/2005 Document Revised: 05/13/2016 Document Reviewed: 09/27/2015 Elsevier Interactive Patient Education  2018 [place].  Preventing Unhealthy [employer], Adult Staying at a healthy weight is important. When fat builds up in your body, you may become overweight or obese. These conditions put you at greater risk for developing certain health problems, such as heart disease, diabetes, sleeping problems, joint problems, and some cancers. Unhealthy weight gain is often the result of making unhealthy choices in what you eat. It is also a result of not getting enough exercise. You can make changes to your lifestyle to prevent obesity and stay as healthy as possible. What nutrition  changes can be made? To maintain a healthy weight and prevent obesity:  Eat only as much as your body needs. To do this: ? Pay attention to signs that you are hungry or full. Stop eating as soon as you feel full. ? If you feel hungry, try drinking water first. Drink enough water so your urine is clear or pale yellow. ? Eat smaller portions. ? Look at serving sizes on food labels. Most foods contain more than one serving per container. ? Eat the recommended amount of calories for your gender and activity level. While most active people should eat around 2,000 calories per day, if you are trying to lose weight or are not very active, you main need to eat less calories. Talk to your health care provider or dietitian about how many calories you should eat each day.  Choose healthy foods, such as: ? Fruits and vegetables. Try to fill at least half of your plate at each meal with fruits and vegetables. ? Whole grains, such as whole wheat bread, brown rice, and quinoa. ? Lean meats, such as chicken or fish. ? Other healthy proteins, such as beans, eggs, or tofu. ? Healthy fats, such as nuts, seeds, fatty fish, and olive oil. ? Low-fat or fat-free dairy.  Check food labels and avoid food and drinks that: ? Are high  in calories. ? Have added sugar. ? Are high in sodium. ? Have saturated fats or trans fats.  Limit how much you eat of the following foods: ? Prepackaged meals. ? Fast food. ? Fried foods. ? Processed meat, such as bacon, sausage, and deli meats. ? Fatty cuts of red meat and poultry with skin.  Cook foods in healthier ways, such as by baking, broiling, or grilling.  When grocery shopping, try to shop around the outside of the store. This helps you buy mostly fresh foods and avoid canned and prepackaged foods.  What lifestyle changes can be made?  Exercise at least 30 minutes 5 or more days each week. Exercising includes brisk walking, yard work, biking, running, swimming, and  team sports like basketball and soccer. Ask your health care provider which exercises are safe for you.  Do not use any products that contain nicotine or tobacco, such as cigarettes and e-cigarettes. If you need help quitting, ask your health care provider.  Limit alcohol intake to no more than 1 drink a day for nonpregnant women and 2 drinks a day for men. One drink equals 12 oz of beer, 5 oz of wine, or 1 oz of hard liquor.  Try to get 7-9 hours of sleep each night. What other changes can be made?  Keep a food and activity journal to keep track of: ? What you ate and how many calories you had. Remember to count sauces, dressings, and side dishes. ? Whether you were active, and what exercises you did. ? Your calorie, weight, and activity goals.  Check your weight regularly. Track any changes. If you notice you have gained weight, make changes to your diet or activity routine.  Avoid taking weight-loss medicines or supplements. Talk to your health care provider before starting any new medicine or supplement.  Talk to your health care provider before trying any new diet or exercise plan. Why are these changes important? Eating healthy, staying active, and having healthy habits not only help prevent obesity, they also:  Help you to manage stress and emotions.  Help you to connect with friends and family.  Improve your self-esteem.  Improve your sleep.  Prevent long-term health problems.  What can happen if changes are not made? Being obese or overweight can cause you to develop joint or bone problems, which can make it hard for you to stay active or do activities you enjoy. Being obese or overweight also puts stress on your heart and lungs and can lead to health problems like diabetes, heart disease, and some cancers. Where to find more information: Talk with your health care provider or a dietitian about healthy eating and healthy lifestyle choices. You may also find other  information through these resources:  [place] Department of Agriculture MyPlate: FormerBoss.no  American Heart Association: www.heart.org  Centers for Disease Control and Prevention: http://www.wolf.info/  Summary  Staying at a healthy weight is important. It helps prevent certain diseases and health problems, such as heart disease, diabetes, joint problems, sleep disorders, and some cancers.  Being obese or overweight can cause you to develop joint or bone problems, which can make it hard for you to stay active or do activities you enjoy.  You can prevent unhealthy weight gain by eating a healthy diet, exercising regularly, not smoking, limiting alcohol, and getting enough sleep.  Talk with your health care provider or a dietitian for guidance about healthy eating and healthy lifestyle choices. This information is not intended to replace advice given  to you by your health care provider. Make sure you discuss any questions you have with your health care provider. Document Released: 12/10/2016 Document Revised: 01/15/2017 Document Reviewed: 01/15/2017 Elsevier Interactive Patient Education  [employer].

## 2018-07-15 NOTE — Assessment & Plan Note (Signed)
USPSTF grade A and B recommendations reviewed with patient; age-appropriate recommendations, preventive care, screening tests, etc discussed and encouraged; healthy living encouraged; see AVS for patient education given to patient  

## 2018-07-15 NOTE — Assessment & Plan Note (Signed)
Rx given for mycolog II; work on weight loss

## 2018-07-15 NOTE — Assessment & Plan Note (Signed)
Most important thing she can do, I told her, is lose weight; check glucose and A1c today

## 2018-07-15 NOTE — Progress Notes (Signed)
Patient ID: Christine Michael, female   DOB: 1966-11-19, 51 y.o.   MRN: [id number]   Subjective:   Christine Michael is a 52 y.o. female here for a complete physical exam  Interim issues since last visit: nothing big  Has an issue on her stomach; under her pannus; as soon as air hits it; fine when skin is closed up; air hits it and she itches  USPSTF grade A and B recommendations Depression:  Depression screen [facility] 2/9 07/15/2018 04/09/2018 09/02/2017 08/11/2017 01/29/2017  Decreased Interest 0 0 0 0 0  Down, Depressed, Hopeless 0 0 0 0 0  PHQ - 2 Score 0 0 0 0 0   Hypertension: BP Readings from Last 3 Encounters:  07/15/18 130/80  05/04/18 112/72  04/09/18 120/70   Obesity: "myself" when asked about what's standing in her way from losing weight Wt Readings from Last 3 Encounters:  07/15/18 (!) 305 lb (138.3 kg)  05/04/18 (!) 308 lb 6.4 oz (139.9 kg)  04/09/18 (!) 305 lb (138.3 kg)   BMI Readings from Last 3 Encounters:  07/15/18 50.75 kg/m  05/04/18 51.32 kg/m  04/09/18 50.75 kg/m     Skin cancer: denies personal/family history; not in the sun a lot- states doesn't wear sunscreen cause she is not there for long Lung cancer:  Smoked a quarter pack for about 20 years- quit in 2009. Denies shob, coughing Breast cancer: Mother had breast cancer diagnosed in 2005; underwent treatment had metastasis to brain and was treated.   Colorectal cancer: colonoscopy in 2015- negative; denies family history; no blood in stools Cervical cancer screening: had hysterectomy with cervix still intact, last pap smear- normal 07/2016  BRCA gene screening: family hx of breast and/or ovarian cancer and/or metastatic prostate cancer? One parent, no other fam members  HIV, hep B, hep C: declines  STD testing and prevention (chl/gon/syphilis): declines  Intimate partner violence: no abuse Contraception: hysterectomy and not sexually active  Osteoporosis: n/a Fall prevention/vitamin D: discussed; Takes 2000  IU of vitamin D a day. She was low at one point Immunizations: tetanus UTD; discussed shingles vaccine  Diet: sodas every now and then; makes tea at home, but drinks tea at restaurant, not many fruit drinks Drinks plenty of water; a 3 bottles a day Exercise: was getting on the elliptical, but then got sick; habit was broken Alcohol: rare   Office Visit from 07/15/2018 in [facility]  AUDIT-C Score  1     Tobacco use: non AAA: n/a Aspirin: will take BC from time to time Glucose: check today Glucose  Date Value Ref Range Status  04/09/2018 104 (H) 65 - 99 mg/dL Final  09/02/2017 94 65 - 99 mg/dL Final  07/29/2016 90 65 - 99 mg/dL Final   Glucose, Bld  Date Value Ref Range Status  05/21/2015 166 (H) 65 - 99 mg/dL Final  04/29/2015 93 65 - 99 mg/dL Final  04/26/2015 94 65 - 99 mg/dL Final   Lipids:  Lab Results  Component Value Date   CHOL 163 04/09/2018   CHOL 175 09/02/2017   CHOL 169 07/29/2016   Lab Results  Component Value Date   HDL 51 04/09/2018   HDL 58 09/02/2017   HDL 58 07/29/2016   Lab Results  Component Value Date   LDLCALC 88 04/09/2018   LDLCALC 103 (H) 09/02/2017   LDLCALC 94 07/29/2016   Lab Results  Component Value Date   TRIG 121 04/09/2018   TRIG 69 09/02/2017  TRIG 85 07/29/2016   Lab Results  Component Value Date   CHOLHDL 3.2 04/09/2018   CHOLHDL 2.9 07/29/2016   CHOLHDL 2.6 09/12/2015   No results found for: LDLDIRECT   Past Medical History:  Diagnosis Date  . Anxiety   . High cholesterol   . Hypertension    Past Surgical History:  Procedure Laterality Date  . ABDOMINAL HYSTERECTOMY  2010   partial  . COLONOSCOPY  2015   [facility]  . SALPINGOOPHORECTOMY Bilateral 07/24/2015   Procedure: ATTEMPTED LAPAROSCOPY, ABDOMINAL BILATERAL SALPINGO OOPHORECTOMY ;  Surgeon: Rubie Maid, MD;  Location: [facility] ORS;  Service: Gynecology;  Laterality: Bilateral;  . TONSILLECTOMY    . TUBAL LIGATION Bilateral 1988    Family History  Problem Relation Age of Onset  . Cancer Mother   . Breast cancer Mother 59  . Alcohol abuse Father    Social History   Tobacco Use  . Smoking status: Former Smoker    Packs/day: 0.25    Years: 20.00    Pack years: 5.00    Last attempt to quit: 07/12/2008    Years since quitting: 10.0  . Smokeless tobacco: Never Used  Substance Use Topics  . Alcohol use: Yes    Alcohol/week: 0.0 - 0.6 oz  . Drug use: No   Review of Systems  Constitutional: Negative for unexpected weight change.  HENT: Negative for hearing loss.   Eyes: Negative for visual disturbance.  Respiratory: Negative for wheezing.   Cardiovascular: Negative for chest pain.  Gastrointestinal: Negative for blood in stool.  Endocrine: Positive for polydipsia.  Genitourinary: Negative for hematuria.  Skin: Positive for rash.  Allergic/Immunologic: Negative for food allergies.  Hematological: Negative for adenopathy. Does not bruise/bleed easily.  Psychiatric/Behavioral: Positive for dysphoric mood.    Objective:   Vitals:   07/15/18 0917  BP: 130/80  Pulse: 76  Resp: 16  Temp: 98 F (36.7 C)  TempSrc: Oral  SpO2: 98%  Weight: (!) 305 lb (138.3 kg)  Height: _0  (1.651 m)   Body mass index is 50.75 kg/m. Wt Readings from Last 3 Encounters:  07/15/18 (!) 305 lb (138.3 kg)  05/04/18 (!) 308 lb 6.4 oz (139.9 kg)  04/09/18 (!) 305 lb (138.3 kg)   Physical Exam  Constitutional: She appears well-developed and well-nourished.  HENT:  Head: Normocephalic and atraumatic.  Right Ear: Hearing, tympanic membrane, external ear and ear canal normal.  Left Ear: Hearing, tympanic membrane, external ear and ear canal normal.  Eyes: Conjunctivae and EOM are normal. Right eye exhibits no hordeolum. Left eye exhibits no hordeolum. No scleral icterus.  Neck: Carotid bruit is not present. No thyromegaly present.  Cardiovascular: Normal rate, regular rhythm, S1 normal, S2 normal and normal heart sounds.   No extrasystoles are present.  Pulmonary/Chest: Effort normal and breath sounds normal. No respiratory distress. Right breast exhibits no inverted nipple, no mass, no nipple discharge, no skin change and no tenderness. Left breast exhibits no inverted nipple, no mass, no nipple discharge, no skin change and no tenderness. Breasts are symmetrical.  Abdominal: Soft. Normal appearance and bowel sounds are normal. She exhibits no distension, no abdominal bruit, no pulsatile midline mass and no mass. There is no hepatosplenomegaly. There is no tenderness. No hernia.  Musculoskeletal: Normal range of motion. She exhibits no edema.  Lymphadenopathy:       Head (right side): No submandibular adenopathy present.       Head (left side): No submandibular adenopathy present.  She has no cervical adenopathy.    She has no axillary adenopathy.  Neurological: She is alert. She displays no tremor. No cranial nerve deficit. She exhibits normal muscle tone. Gait normal.  Reflex Scores:      Patellar reflexes are 2+ on the right side and 2+ on the left side. Skin: Skin is warm and dry. No bruising and no ecchymosis noted. No cyanosis. No pallor.     Lichenified skin with few erosions, mild erythema  Psychiatric: Her speech is normal and behavior is normal. Thought content normal. Her mood appears not anxious. She does not exhibit a depressed mood.    Assessment/Plan:   Problem List Items Addressed This Visit      Musculoskeletal and Integument   Intertrigo    Rx given for mycolog II; work on weight loss        Other   Prediabetes (Chronic)    Most important thing she can do, I told her, is lose weight; check glucose and A1c today      Relevant Orders   Hemoglobin A1c   Morbid obesity (HCC) (Chronic)    Encouraged weight loss; asked about any barriers; "myself" she stated; see AVS      Annual physical exam - Primary    USPSTF grade A and B recommendations reviewed with patient; age-appropriate  recommendations, preventive care, screening tests, etc discussed and encouraged; healthy living encouraged; see AVS for patient education given to patient        Other Visit Diagnoses    Preventative health care       Relevant Orders   Comprehensive metabolic panel   Measles, mumps, rubella (MMR) vaccination status unknown       Relevant Orders   Measles/Mumps/Rubella Immunity   Need for shingles vaccine       shingrix discussed and given today   Relevant Orders   Varicella-zoster vaccine IM (Shingrix) (Completed)       Meds ordered this encounter  Medications  . nystatin-triamcinolone ointment (MYCOLOG)    Sig: Apply 1 application topically 2 (two) times daily.    Dispense:  120 g    Refill:  2   Orders Placed This Encounter  Procedures  . Varicella-zoster vaccine IM (Shingrix)  . Hemoglobin A1c  . Comprehensive metabolic panel  . Measles/Mumps/Rubella Immunity    Follow up plan: Return in about 1 year (around 07/16/2019) for complete physical.  An After Visit Summary was printed and given to the patient.

## 2018-07-16 ENCOUNTER — Other Ambulatory Visit: Payer: Self-pay | Admitting: Family Medicine

## 2018-07-16 DIAGNOSIS — Z1231 Encounter for screening mammogram for malignant neoplasm of breast: Secondary | ICD-10-CM

## 2018-07-16 LAB — COMPREHENSIVE METABOLIC PANEL
A/G RATIO: 1.5 (ref 1.2–2.2)
ALK PHOS: 65 IU/L (ref 39–117)
ALT: 22 IU/L (ref 0–32)
AST: 18 IU/L (ref 0–40)
Albumin: 4.3 g/dL (ref 3.5–5.5)
BILIRUBIN TOTAL: 0.3 mg/dL (ref 0.0–1.2)
BUN / CREAT RATIO: 17 (ref 9–23)
BUN: 12 mg/dL (ref 6–24)
CO2: 25 mmol/L (ref 20–29)
Calcium: 9.3 mg/dL (ref 8.7–10.2)
Chloride: 103 mmol/L (ref 96–106)
Creatinine, Ser: 0.7 mg/dL (ref 0.57–1.00)
GFR calc non Af Amer: 99 mL/min/{1.73_m2} (ref >59)
GFR, EST AFRICAN AMERICAN: 114 mL/min/{1.73_m2} (ref >59)
GLUCOSE: 94 mg/dL (ref 65–99)
Globulin, Total: 2.9 g/dL (ref 1.5–4.5)
POTASSIUM: 4.1 mmol/L (ref 3.5–5.2)
SODIUM: 143 mmol/L (ref 134–144)
Total Protein: 7.2 g/dL (ref 6.0–8.5)

## 2018-07-16 LAB — MEASLES/MUMPS/RUBELLA IMMUNITY
MUMPS ABS, IGG: 260 AU/mL (ref >10.9)
RUBEOLA AB, IGG: >300 AU/mL (ref >29.9)
Rubella Antibodies, IGG: 3.52 index (ref >0.99)

## 2018-07-16 LAB — HEMOGLOBIN A1C
ESTIMATED AVERAGE GLUCOSE: 131 mg/dL
Hgb A1c MFr Bld: 6.2 % — ABNORMAL HIGH (ref 4.8–5.6)

## 2018-08-03 ENCOUNTER — Encounter: Payer: Self-pay | Admitting: Family Medicine

## 2018-08-03 ENCOUNTER — Ambulatory Visit (INDEPENDENT_AMBULATORY_CARE_PROVIDER_SITE_OTHER): Payer: 59 | Admitting: Family Medicine

## 2018-08-03 VITALS — BP 132/64 | HR 86 | Temp 98.5°F | Ht 65.0 in | Wt 301.3 lb

## 2018-08-03 DIAGNOSIS — N3001 Acute cystitis with hematuria: Secondary | ICD-10-CM

## 2018-08-03 DIAGNOSIS — R310 Gross hematuria: Secondary | ICD-10-CM

## 2018-08-03 DIAGNOSIS — R300 Dysuria: Secondary | ICD-10-CM

## 2018-08-03 LAB — POCT URINALYSIS DIPSTICK
Bilirubin, UA: NEGATIVE
GLUCOSE UA: NEGATIVE
Ketones, UA: NEGATIVE
Nitrite, UA: NEGATIVE
ODOR: NORMAL
Protein, UA: NEGATIVE
Spec Grav, UA: 1.01 (ref 1.010–1.025)
Urobilinogen, UA: 0.2 E.U./dL
pH, UA: 8.5 — AB (ref 5.0–8.0)

## 2018-08-03 MED ORDER — FLUCONAZOLE 150 MG PO TABS: 150.0000 mg | ORAL_TABLET | Freq: Once | ORAL | 0 refills | Status: AC

## 2018-08-03 MED ORDER — CEFDINIR 300 MG PO CAPS: 300.0000 mg | ORAL_CAPSULE | Freq: Two times a day (BID) | ORAL | 0 refills | Status: DC

## 2018-08-03 NOTE — Progress Notes (Signed)
BP 132/64   Pulse 86   Temp 98.5 F (36.9 C) (Oral)   Ht 5\' 5"  (1.651 m)   Wt (!) 301 lb 4.8 oz (136.7 kg)   LMP  (LMP Unknown)   SpO2 96%   BMI 50.14 kg/m    Subjective:    Patient ID: Christine Michael, female    DOB: 09/30/1966, 52 y.o.   MRN: [id number]  HPI: Christine RubensteinDenise K Mierzejewski is a 52 y.o. female  Chief Complaint  Patient presents with  . Urinary Tract Infection    onset 3days symptoms include frequency, urgency, pressure and burning    HPI  Symptoms started on Saturday; started feeling twinges down low; when she would pee, just felt like everything was going to fall out; urgency; no real odor but did see blood this morning; no hx of kidney stones; on vacation last week, but not long car trips; excellent water drinker; cases and cases of water at the house; no recent ABX for two months; no fevers; no nausea or vomiting  [facility] 07/15/2018 04/09/2018 09/02/2017 08/11/2017 01/29/2017  Decreased Interest 0 0 0 0 0  Down, Depressed, Hopeless 0 0 0 0 0  PHQ - 2 Score 0 0 0 0 0    Relevant past medical, surgical, family and social history reviewed Past Medical History:  Diagnosis Date  . Anxiety   . High cholesterol   . Hypertension    Past Surgical History:  Procedure Laterality Date  . ABDOMINAL HYSTERECTOMY  2010   partial  . COLONOSCOPY  2015   [facility]  . SALPINGOOPHORECTOMY Bilateral 07/24/2015   Procedure: ATTEMPTED LAPAROSCOPY, ABDOMINAL BILATERAL SALPINGO OOPHORECTOMY ;  Surgeon: Hildred LaserAnika Cherry, MD;  Location: [facility] ORS;  Service: Gynecology;  Laterality: Bilateral;  . TONSILLECTOMY    . TUBAL LIGATION Bilateral 1988   Family History  Problem Relation Age of Onset  . Cancer Mother   . Breast cancer Mother 473  . Alcohol abuse Father    Social History   Tobacco Use  . Smoking status: Former Smoker    Packs/day: 0.25    Years: 20.00    Pack years: 5.00    Last attempt to quit: 07/12/2008    Years since quitting: 10.0  . Smokeless tobacco:  Never Used  Substance Use Topics  . Alcohol use: Yes    Alcohol/week: 0.0 - 1.0 standard drinks  . Drug use: No    Interim medical history since last visit reviewed. Allergies and medications reviewed  Review of Systems Per HPI unless specifically indicated above     Objective:    BP 132/64   Pulse 86   Temp 98.5 F (36.9 C) (Oral)   Ht 5\' 5"  (1.651 m)   Wt (!) 301 lb 4.8 oz (136.7 kg)   LMP  (LMP Unknown)   SpO2 96%   BMI 50.14 kg/m   Wt Readings from Last 3 Encounters:  08/03/18 (!) 301 lb 4.8 oz (136.7 kg)  07/15/18 (!) 305 lb (138.3 kg)  05/04/18 (!) 308 lb 6.4 oz (139.9 kg)    Physical Exam  Constitutional: She appears well-developed and well-nourished.  Morbidly obese female, nad  HENT:  Mouth/Throat: Mucous membranes are normal.  Eyes: EOM are normal. No scleral icterus.  Cardiovascular: Normal rate and regular rhythm.  Pulmonary/Chest: Effort normal and breath sounds normal.  Abdominal: There is tenderness in the suprapubic area.  Morbidly obese  Skin: She is not diaphoretic.  Psychiatric: She has a normal  mood and affect. Her behavior is normal. Her mood appears not anxious.    Results for orders placed or performed in visit on 08/03/18  POCT urinalysis dipstick  Result Value Ref Range   Color, UA drk yellow    Clarity, UA clear    Glucose, UA Negative Negative   Bilirubin, UA neg    Ketones, UA neg    Spec Grav, UA 1.010 1.010 - 1.025   Blood, UA moderate    pH, UA 8.5 (A) 5.0 - 8.0   Protein, UA Negative Negative   Urobilinogen, UA 0.2 0.2 or 1.0 E.U./dL   Nitrite, UA neg    Leukocytes, UA Large (3+) (A) Negative   Appearance clear    Odor normal       Assessment & Plan:   Problem List Items Addressed This Visit    None    Visit Diagnoses    Acute hemorrhagic cystitis    -  Primary   reviewed urine dip with her; culture pending; pH leads me to believe Proteus mirabilis may be responsible; reviewed allergies; recheck urine in 3 wks    Burning with urination       acute cystitis; antibiotics, hydration   Relevant Orders   POCT urinalysis dipstick (Completed)   Urine Culture   Urinalysis   Urine Culture   Urinalysis, microscopic only   Gross hematuria       likely secondary to cystitis; recheck urine in 3 weeks after completion of treatment to make sure this has completely resolved; orders given to pt       Follow up plan: No follow-ups on file.  An after-visit summary was printed and given to the patient at check-out.  Please see the patient instructions which may contain other information and recommendations beyond what is mentioned above in the assessment and plan.  Meds ordered this encounter  Medications  . cefdinir (OMNICEF) 300 MG capsule    Sig: Take 1 capsule (300 mg total) by mouth 2 (two) times daily.    Dispense:  14 capsule    Refill:  0  . fluconazole (DIFLUCAN) 150 MG tablet    Sig: Take 1 tablet (150 mg total) by mouth once for 1 dose.    Dispense:  1 tablet    Refill:  0    Orders Placed This Encounter  Procedures  . Urine Culture  . Urine Culture  . Urinalysis  . Urinalysis, microscopic only  . POCT urinalysis dipstick

## 2018-08-03 NOTE — Patient Instructions (Addendum)
Start the new antibiotic Use the diflucan if needed Please do eat yogurt or kimchi or take a probiotic daily for the next month We want to replace the healthy germs in the gut If you notice foul, watery diarrhea in the next two months, schedule an appointment RIGHT AWAY or go to an urgent care or the emergency room if a holiday or over a weekend Recheck your urine in 3 weeks Call sooner with any problems

## 2018-08-05 ENCOUNTER — Other Ambulatory Visit: Payer: Self-pay | Admitting: Family Medicine

## 2018-08-05 DIAGNOSIS — E7800 Pure hypercholesterolemia, unspecified: Secondary | ICD-10-CM

## 2018-08-05 LAB — URINE CULTURE

## 2018-08-05 NOTE — Telephone Encounter (Signed)
Last sgpt and lipid panel reviewed rx approved

## 2018-08-18 DIAGNOSIS — R300 Dysuria: Secondary | ICD-10-CM | POA: Diagnosis not present

## 2018-08-20 LAB — URINALYSIS
Bilirubin, UA: NEGATIVE
Glucose, UA: NEGATIVE
KETONES UA: NEGATIVE
LEUKOCYTES UA: NEGATIVE
NITRITE UA: NEGATIVE
PROTEIN UA: NEGATIVE
RBC UA: NEGATIVE
SPEC GRAV UA: 1.029 (ref 1.005–1.030)
Urobilinogen, Ur: 0.2 mg/dL (ref 0.2–1.0)
pH, UA: 6 (ref 5.0–7.5)

## 2018-08-20 LAB — URINE CULTURE

## 2018-08-20 LAB — URINALYSIS, MICROSCOPIC ONLY: CASTS: NONE SEEN /LPF

## 2018-08-23 ENCOUNTER — Other Ambulatory Visit: Payer: Self-pay | Admitting: Family Medicine

## 2018-08-23 DIAGNOSIS — F419 Anxiety disorder, unspecified: Secondary | ICD-10-CM

## 2018-09-01 ENCOUNTER — Telehealth: Payer: Self-pay

## 2018-09-01 DIAGNOSIS — R310 Gross hematuria: Secondary | ICD-10-CM

## 2018-09-01 NOTE — Telephone Encounter (Signed)
-----   Message from Melinda P Lada, MD sent at 08/31/2018  5:31 PM EDT ----- Caeli Linehan, Please have patient return for recheck urine; her last urine micro showed 3-10 RBCs per high powered field; this needs evaluation; have her come back in for recheck urine; please ORDER urine dip and micro, dx: hematuria 

## 2018-09-01 NOTE — Telephone Encounter (Signed)
-----   Message from Kerman Passey, MD sent at 08/31/2018  5:31 PM EDT ----- Christine Michael, Please have patient return for recheck urine; her last urine micro showed 3-10 RBCs per high powered field; this needs evaluation; have her come back in for recheck urine; please ORDER urine dip and micro, dx: hematuria

## 2018-09-07 ENCOUNTER — Other Ambulatory Visit: Payer: Self-pay

## 2018-09-07 ENCOUNTER — Ambulatory Visit: Payer: 59

## 2018-09-07 DIAGNOSIS — R310 Gross hematuria: Secondary | ICD-10-CM

## 2018-09-07 NOTE — Progress Notes (Signed)
Urine exam

## 2018-09-08 ENCOUNTER — Other Ambulatory Visit: Payer: Self-pay | Admitting: Family Medicine

## 2018-09-08 DIAGNOSIS — F419 Anxiety disorder, unspecified: Secondary | ICD-10-CM

## 2018-09-08 LAB — URINALYSIS, ROUTINE W REFLEX MICROSCOPIC
BILIRUBIN URINE: NEGATIVE
GLUCOSE, UA: NEGATIVE
Hgb urine dipstick: NEGATIVE
Ketones, ur: NEGATIVE
LEUKOCYTES UA: NEGATIVE
NITRITE: NEGATIVE
Protein, ur: NEGATIVE
SPECIFIC GRAVITY, URINE: 1.027 (ref 1.001–1.03)
pH: 6 (ref 5.0–8.0)

## 2018-09-14 ENCOUNTER — Telehealth: Payer: Self-pay | Admitting: Family Medicine

## 2018-09-14 ENCOUNTER — Other Ambulatory Visit: Payer: Self-pay

## 2018-09-14 DIAGNOSIS — I10 Essential (primary) hypertension: Secondary | ICD-10-CM

## 2018-09-14 MED ORDER — TRIAMTERENE-HCTZ 37.5-25 MG PO TABS: 1.0000 | ORAL_TABLET | Freq: Every day | ORAL | 1 refills | Status: DC

## 2018-09-14 NOTE — Telephone Encounter (Signed)
Rx refill   PCP:  Sherie DonLada

## 2018-09-14 NOTE — Telephone Encounter (Signed)
Copied from CRM [id number]. Topic: Quick Communication - See Telephone Encounter >> Sep 14, 2018 11:57 AM Mare LoanBurton, Donna F wrote: Pt is needing a refill on her triamterene- hydrochlorothiazide and citalopram  [facility] mail order  Best number  [phone]

## 2018-09-16 MED ORDER — TRIAMTERENE-HCTZ 37.5-25 MG PO TABS: 1.0000 | ORAL_TABLET | Freq: Every day | ORAL | 1 refills | Status: DC

## 2018-09-16 NOTE — Telephone Encounter (Signed)
Pt states that this medication: citalopram (CELEXA) 20 MG tablet as well as her triamterene-hydrochlorothiazide (MAXZIDE-25) 37.5-25 MG tablet  need to be re-sent to:   [facility] - [place], [place] - [address]  [address] [place] [address] [place] [address]  Phone: [phone] Fax: [phone]

## 2018-09-16 NOTE — Addendum Note (Signed)
Addended by: Davene CostainGRAVES, Nahomy Limburg C on: 09/16/2018 11:18 AM   Modules accepted: Orders

## 2018-09-16 NOTE — Telephone Encounter (Signed)
Please resend bp med to mail order

## 2018-09-16 NOTE — Addendum Note (Signed)
Addended by: LADA, Janit Bern on: 09/16/2018 06:24 PM   Modules accepted: Orders

## 2018-09-17 ENCOUNTER — Ambulatory Visit
Admission: RE | Admit: 2018-09-17 | Discharge: 2018-09-17 | Disposition: A | Discharge status: Home / Self Care | Payer: 59 | Source: Ambulatory Visit | Attending: Family Medicine | Admitting: Family Medicine

## 2018-09-17 ENCOUNTER — Other Ambulatory Visit: Payer: Self-pay | Admitting: Family Medicine

## 2018-09-17 DIAGNOSIS — F419 Anxiety disorder, unspecified: Secondary | ICD-10-CM

## 2018-09-17 DIAGNOSIS — Z1231 Encounter for screening mammogram for malignant neoplasm of breast: Secondary | ICD-10-CM | POA: Insufficient documentation

## 2018-10-15 ENCOUNTER — Ambulatory Visit (INDEPENDENT_AMBULATORY_CARE_PROVIDER_SITE_OTHER): Payer: 59

## 2018-10-15 DIAGNOSIS — Z23 Encounter for immunization: Secondary | ICD-10-CM

## 2019-01-29 DIAGNOSIS — I10 Essential (primary) hypertension: Secondary | ICD-10-CM | POA: Diagnosis not present

## 2019-01-29 DIAGNOSIS — G4733 Obstructive sleep apnea (adult) (pediatric): Secondary | ICD-10-CM | POA: Diagnosis not present

## 2019-01-29 DIAGNOSIS — R7309 Other abnormal glucose: Secondary | ICD-10-CM | POA: Diagnosis not present

## 2019-02-05 ENCOUNTER — Other Ambulatory Visit: Payer: Self-pay | Admitting: Family Medicine

## 2019-02-05 DIAGNOSIS — F419 Anxiety disorder, unspecified: Secondary | ICD-10-CM

## 2019-03-23 ENCOUNTER — Telehealth: Payer: Self-pay | Admitting: Family Medicine

## 2019-03-23 DIAGNOSIS — I10 Essential (primary) hypertension: Secondary | ICD-10-CM

## 2019-03-24 NOTE — Telephone Encounter (Signed)
Patient has prediabetes and has not been seen or had labs done in over six months  Lab Results  Component Value Date   CREATININE 0.70 07/15/2018   Lab Results  Component Value Date   K 4.1 07/15/2018    Lab Results  Component Value Date   HGBA1C 6.2 (H) 07/15/2018   Please ask her to come by for an A1c (okay to do at her car) Then schedule her for a telehealth visit a day or two later for a visit re: prediabetes and hypertension and obesity We'll get all of her other labs in a couple of months when the pandemic slows down  Thank you

## 2019-03-24 NOTE — Telephone Encounter (Signed)
Spoke with patient and she will be coming by on Friday to do her A1C then she scheduled the appt for next Wednesday for her virtual visit. Pt also informed that script has been sent to pharmacy

## 2019-03-26 ENCOUNTER — Other Ambulatory Visit: Payer: Self-pay

## 2019-03-26 ENCOUNTER — Ambulatory Visit (INDEPENDENT_AMBULATORY_CARE_PROVIDER_SITE_OTHER): Payer: 59

## 2019-03-26 DIAGNOSIS — R7303 Prediabetes: Secondary | ICD-10-CM

## 2019-03-26 LAB — POCT GLYCOSYLATED HEMOGLOBIN (HGB A1C)
HbA1c POC (<> result, manual entry): 6.1 % (ref 4.0–5.6)
HbA1c, POC (controlled diabetic range): 6.1 % (ref 0.0–7.0)
HbA1c, POC (prediabetic range): 6.1 % (ref 5.7–6.4)
Hemoglobin A1C: 6.1 % — AB (ref 4.0–5.6)

## 2019-03-31 ENCOUNTER — Other Ambulatory Visit: Payer: Self-pay

## 2019-03-31 ENCOUNTER — Ambulatory Visit (INDEPENDENT_AMBULATORY_CARE_PROVIDER_SITE_OTHER): Payer: 59 | Admitting: Family Medicine

## 2019-03-31 ENCOUNTER — Encounter: Payer: Self-pay | Admitting: Family Medicine

## 2019-03-31 DIAGNOSIS — R7303 Prediabetes: Secondary | ICD-10-CM

## 2019-03-31 DIAGNOSIS — E7800 Pure hypercholesterolemia, unspecified: Secondary | ICD-10-CM | POA: Diagnosis not present

## 2019-03-31 DIAGNOSIS — I10 Essential (primary) hypertension: Secondary | ICD-10-CM | POA: Diagnosis not present

## 2019-03-31 NOTE — Assessment & Plan Note (Signed)
Controlled, limit salt 

## 2019-03-31 NOTE — Assessment & Plan Note (Signed)
Controlled; next A1c will be due in October

## 2019-03-31 NOTE — Assessment & Plan Note (Signed)
Continue statin, try to limit sat fats

## 2019-03-31 NOTE — Assessment & Plan Note (Signed)
Praise given for weight loss even with this outbreak; keep up the good work

## 2019-03-31 NOTE — Progress Notes (Signed)
BP 138/81 (BP Location: Left Arm, Patient Position: Sitting, Cuff Size: Normal)   Pulse 81   Wt 294 lb (133.4 kg)   LMP  (LMP Unknown)   BMI 48.92 kg/m    Subjective:    Patient ID: Christine Michael, female    DOB: 02/23/1966, 53 y.o.   MRN: [id number]  HPI: Christine Michael is a 53 y.o. female  Chief Complaint  Patient presents with  . Follow-up    HPI Virtual Visit via Telephone Note   I connect with the patient by telephone on March 31, 2019 I verified that I am speaking with the correct person using two identifiers.   I discussed the limitations, risks, security, and privacy concerns of performing an evaluation and management service by telephone and the availability of in-person appointments. Staff discussed with the patient that he/she may be responsible for charges related to this service. The patient expressed understanding and agreed to proceed.  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  High cholesterol; on statin; not much hamburger; more [place]; occasional roast, less red meat; tolerating statins  Prediabetes; she has no idea why it's better; trying to avoid sugary drinks, not big on sugar; did not drink sodas as a child; no real walking more than normal; social distancing  Lab Results  Component Value Date   HGBA1C 6.1 (A) 03/26/2019   HGBA1C 6.1 03/26/2019   HGBA1C 6.1 03/26/2019   HGBA1C 6.1 03/26/2019   Obesity; definitely not where she wants it; she has a scale; she is eating three meals a day and not overeating during this COVID-19 outbreak  Anxiety;  taking SSRI and that is working; she tried cutting it in half and had to go to back to regular dose  No sx COVID-19; staying at the home, working from home  [facility] 03/31/2019 07/15/2018 04/09/2018 09/02/2017 08/11/2017  Decreased Interest 0 0 0 0 0  Down, Depressed, Hopeless 0 0 0 0 0  PHQ - 2 Score 0 0 0 0 0  Altered sleeping 0 - - - -  Tired, decreased energy 0 - - - -  Change in appetite 0 - - - -  Feeling bad or failure about yourself  0 - - - -  Trouble concentrating 0 - - - -  Moving slowly or fidgety/restless 0 - - - -  Suicidal thoughts 0 - - - -  PHQ-9 Score 0 - - - -  Difficult doing work/chores Not difficult at all - - - -   Fall Risk  03/31/2019 07/15/2018 04/09/2018 09/02/2017 08/11/2017  Falls in the past year? 0 No No No No    Relevant past medical, surgical, family and social history reviewed Past Medical History:  Diagnosis Date  . Anxiety   . High cholesterol   . Hypertension    Past Surgical History:  Procedure Laterality Date  . ABDOMINAL HYSTERECTOMY  2010   partial  . COLONOSCOPY  2015   [facility]  . SALPINGOOPHORECTOMY Bilateral 07/24/2015   Procedure: ATTEMPTED LAPAROSCOPY, ABDOMINAL BILATERAL SALPINGO OOPHORECTOMY ;  Surgeon:  Hildred Laser, MD;  Location: [facility] ORS;  Service: Gynecology;  Laterality: Bilateral;  . TONSILLECTOMY    . TUBAL LIGATION Bilateral 1988   Family History  Problem Relation Age of Onset  . Cancer Mother   . Breast cancer Mother 30  . Alcohol abuse Father    Social History   Tobacco Use  . Smoking status: Former Smoker    Packs/day: 0.25    Years: 20.00    Pack years: 5.00    Last attempt to quit: 07/12/2008    Years since quitting: 10.7  . Smokeless tobacco: Never Used  Substance Use Topics  . Alcohol use: Yes    Alcohol/week: 0.0 - 1.0 standard drinks  . Drug use: No     [facility] Visit from 03/31/2019 in [facility]  AUDIT-C Score  0      Interim medical history since last visit  reviewed. Allergies and medications reviewed  Review of Systems Per HPI unless specifically indicated above     Objective:    BP 138/81 (BP Location: Left Arm, Patient Position: Sitting, Cuff Size: Normal)   Pulse 81   Wt 294 lb (133.4 kg)   LMP  (LMP Unknown)   BMI 48.92 kg/m   Wt Readings from Last 3 Encounters:  03/31/19 294 lb (133.4 kg)  08/03/18 (!) 301 lb 4.8 oz (136.7 kg)  07/15/18 (!) 305 lb (138.3 kg)    Physical Exam Pulmonary:     Effort: No respiratory distress.  Neurological:     Mental Status: She is alert.     Cranial Nerves: No dysarthria.  Psychiatric:        Speech: Speech is not rapid and pressured, delayed or slurred.    Results for orders placed or performed in visit on 03/26/19  POCT glycosylated hemoglobin (Hb A1C)  Result Value Ref Range   Hemoglobin A1C 6.1 (A) 4.0 - 5.6 %   HbA1c POC (<> result, manual entry) 6.1 4.0 - 5.6 %   HbA1c, POC (prediabetic range) 6.1 5.7 - 6.4 %   HbA1c, POC (controlled diabetic range) 6.1 0.0 - 7.0 %      Assessment & Plan:   Problem List Items Addressed This Visit      Cardiovascular and Mediastinum   Hypertension (Chronic)    Controlled, limit salt        Other   Prediabetes (Chronic)    Controlled; next A1c will be due in October      Morbid obesity (HCC) (Chronic)    Praise given for weight loss even with this outbreak; keep up the good work      High cholesterol (Chronic)    Continue statin, try to limit sat fats          Follow up plan: Return in about 6 months (around 09/30/2019).  An after-visit summary was printed and given to the patient at check-out.  Please see the patient instructions which may contain other information and recommendations beyond what is mentioned above in the assessment and plan.  No orders of the defined types were placed in this encounter.   No orders of the defined types were placed in this encounter.

## 2019-03-31 NOTE — Assessment & Plan Note (Signed)
>>  ASSESSMENT AND PLAN FOR HYPERTENSION WRITTEN ON 03/31/2019  8:27 AM BY LADA, MELINDA P, MD  Controlled, limit salt

## 2019-04-19 ENCOUNTER — Other Ambulatory Visit: Payer: Self-pay | Admitting: Family Medicine

## 2019-04-19 NOTE — Telephone Encounter (Signed)
Copied from CRM [id number]. Topic: Quick Communication - Rx Refill/Question >> Apr 19, 2019 11:12 AM Jaquita Rector A wrote: Medication: citalopram (CELEXA) 20 MG tablet   Has the patient contacted their pharmacy? Yes.   (Agent: If no, request that the patient contact the pharmacy for the refill.) (Agent: If yes, when and what did the pharmacy advise?) [id number] [place], [place] - [address] AT [facility] [phone] (Phone) [phone] (Fax)   Preferred Pharmacy (with phone number or street name):   Agent: Please be advised that RX refills may take up to 3 business days. We ask that you follow-up with your pharmacy.

## 2019-04-20 ENCOUNTER — Other Ambulatory Visit: Payer: Self-pay

## 2019-04-20 ENCOUNTER — Other Ambulatory Visit: Payer: Self-pay | Admitting: Nurse Practitioner

## 2019-04-20 DIAGNOSIS — F419 Anxiety disorder, unspecified: Secondary | ICD-10-CM

## 2019-04-20 MED ORDER — CITALOPRAM HYDROBROMIDE 20 MG PO TABS: 20.0000 mg | ORAL_TABLET | Freq: Every day | ORAL | 1 refills | Status: DC

## 2019-04-20 NOTE — Telephone Encounter (Signed)
Ordered

## 2019-04-20 NOTE — Telephone Encounter (Signed)
Pt is out of Celexa and want to know if some can be called into  [facility]. Please advise pt

## 2019-04-27 ENCOUNTER — Other Ambulatory Visit: Payer: Self-pay | Admitting: Family Medicine

## 2019-04-27 DIAGNOSIS — E7800 Pure hypercholesterolemia, unspecified: Secondary | ICD-10-CM

## 2019-05-31 ENCOUNTER — Encounter: Payer: Self-pay | Admitting: Internal Medicine

## 2019-06-11 ENCOUNTER — Encounter: Payer: Self-pay | Admitting: Internal Medicine

## 2019-06-11 ENCOUNTER — Other Ambulatory Visit: Payer: Self-pay

## 2019-06-11 ENCOUNTER — Ambulatory Visit (INDEPENDENT_AMBULATORY_CARE_PROVIDER_SITE_OTHER): Payer: 59 | Admitting: Internal Medicine

## 2019-06-11 DIAGNOSIS — R7303 Prediabetes: Secondary | ICD-10-CM

## 2019-06-11 DIAGNOSIS — Z1329 Encounter for screening for other suspected endocrine disorder: Secondary | ICD-10-CM

## 2019-06-11 DIAGNOSIS — E559 Vitamin D deficiency, unspecified: Secondary | ICD-10-CM

## 2019-06-11 DIAGNOSIS — M8580 Other specified disorders of bone density and structure, unspecified site: Secondary | ICD-10-CM

## 2019-06-11 DIAGNOSIS — E7800 Pure hypercholesterolemia, unspecified: Secondary | ICD-10-CM | POA: Diagnosis not present

## 2019-06-11 DIAGNOSIS — Z1231 Encounter for screening mammogram for malignant neoplasm of breast: Secondary | ICD-10-CM

## 2019-06-11 DIAGNOSIS — I10 Essential (primary) hypertension: Secondary | ICD-10-CM

## 2019-06-11 DIAGNOSIS — Z1389 Encounter for screening for other disorder: Secondary | ICD-10-CM

## 2019-06-11 MED ORDER — TRIAMTERENE-HCTZ 37.5-25 MG PO TABS: 1.0000 | ORAL_TABLET | Freq: Every day | ORAL | 3 refills | Status: DC

## 2019-06-11 NOTE — Progress Notes (Addendum)
Virtual Visit via Video Note  I connected with Christine Michael  on 06/11/19 at  9:05 AM EDT by a video enabled telemedicine application and verified that I am speaking with the correct person using two identifiers.  Location patient: home Location provider:work Persons participating in the virtual visit: patient, provider  I discussed the limitations of evaluation and management by telemedicine and the availability of in person appointments. The patient expressed understanding and agreed to proceed.   HPI: 1. HTN controlled BP 129/75 on maxzide 37.5-25 mg qd  2. HLD on lipitor 20 mg qd  3. celexa 20 mg controlled anxiety   She had labs with [facility] 06/01/2019 glucose was 102, A1C 6.2 and had cholesterol, BMI and covid ab testing    ROS: See pertinent positives and negatives per HPI. General: weight gain  HEENT: no sore throat CV: no chest pain  Lungs: no sob  GI: no ab pain  MSK: occasional joint pain  Neuro: no h/a, dizziness  Psych: no depression, anxiety controlled   Past Medical History:  Diagnosis Date  . Anxiety    panic/anxiety 12/2010 celexa 20 is helping  . High cholesterol   . Hypertension   . Osteopenia     Past Surgical History:  Procedure Laterality Date  . ABDOMINAL HYSTERECTOMY  2010   partial both ovaries out  . COLONOSCOPY  2015   [facility]  . SALPINGOOPHORECTOMY Bilateral 07/24/2015   Procedure: ATTEMPTED LAPAROSCOPY, ABDOMINAL BILATERAL SALPINGO OOPHORECTOMY ;  Surgeon: Rubie Maid, MD;  Location: [facility] ORS;  Service: Gynecology;  Laterality: Bilateral;  . TONSILLECTOMY    . TUBAL LIGATION Bilateral 1988    Family History  Problem Relation Age of Onset  . Cancer Mother   . Breast cancer Mother 62  . Alcohol abuse Father   . Renal Disease Sister        on HD    SOCIAL HX: works [employer] has 1 son sister Delayne Sanzo    Current Outpatient Medications:  .  atorvastatin (LIPITOR) 20 MG tablet, TAKE 1 TABLET BY MOUTH AT  BEDTIME, Disp: 90  tablet, Rfl: 2 .  Cholecalciferol (VITAMIN D PO), Take 1,000 Units by mouth 2 (two) times daily., Disp: , Rfl:  .  citalopram (CELEXA) 20 MG tablet, Take 1 tablet (20 mg total) by mouth daily., Disp: 90 tablet, Rfl: 1 .  nystatin-triamcinolone ointment (MYCOLOG), Apply 1 application topically 2 (two) times daily., Disp: 120 g, Rfl: 2 .  triamterene-hydrochlorothiazide (MAXZIDE-25) 37.5-25 MG tablet, TAKE 1 TABLET BY MOUTH  DAILY, Disp: 90 tablet, Rfl: 0  EXAM:  VITALS per patient if applicable:  GENERAL: alert, oriented, appears well and in no acute distress  HEENT: atraumatic, conjunttiva clear, no obvious abnormalities on inspection of external nose and ears  NECK: normal movements of the head and neck  LUNGS: on inspection no signs of respiratory distress, breathing rate appears normal, no obvious gross SOB, gasping or wheezing  CV: no obvious cyanosis  MS: moves all visible extremities without noticeable abnormality  PSYCH/NEURO: pleasant and cooperative, no obvious depression or anxiety, speech and thought processing grossly intact  ASSESSMENT AND PLAN:  Discussed the following assessment and plan:  Essential hypertension - Plan: cont meds  Get copy of labs 06/01/19  Osteopenia, unspecified location - Plan: dexa 10/13/17   High cholesterol - Plan: lipitor 20 mg qd   Prediabetes - Plan: rec healthy diet and exercise   Morbid obesity (HCC) -is 313 lbs   HM Declines flu shot  tdap and shingrix utd  MMR immune   Hep C neg 09/02/17  Declines HIV  Pap neg 07/2016 due in 3 years now will do at f/u [place] referred  colonoscopy had in [place] 08/2014 due in 2025   Physical at f/u  05/31/19 [facility] labs  A1C 6.2  Cr 0.75  TC 157, TG 74, HDL 51, LDL 91   Wt 313 lbs ht 65 inches Waist circum 52.5  BMI 52.1  BP 114/59 Glucose 102  COVID 19 negative     I discussed the assessment and treatment plan with the patient. The patient was provided an opportunity to ask  questions and all were answered. The patient agreed with the plan and demonstrated an understanding of the instructions.   The patient was advised to call back or seek an in-person evaluation if the symptoms worsen or if the condition fails to improve as anticipated.  Time spent 25 minutes  Delorise Jackson, MD

## 2019-06-11 NOTE — Addendum Note (Signed)
Addended by: Orland Mustard on: 06/11/2019 06:52 PM   Modules accepted: Orders

## 2019-06-11 NOTE — Patient Instructions (Signed)

## 2019-06-15 ENCOUNTER — Encounter: Payer: Self-pay | Admitting: Internal Medicine

## 2019-07-15 ENCOUNTER — Other Ambulatory Visit: Payer: Self-pay | Admitting: Internal Medicine

## 2019-07-15 DIAGNOSIS — F419 Anxiety disorder, unspecified: Secondary | ICD-10-CM

## 2019-07-15 MED ORDER — CITALOPRAM HYDROBROMIDE 20 MG PO TABS: 20.0000 mg | ORAL_TABLET | Freq: Every day | ORAL | 3 refills | Status: DC

## 2019-07-20 ENCOUNTER — Encounter: Payer: 59 | Admitting: Family Medicine

## 2019-09-13 ENCOUNTER — Other Ambulatory Visit: Payer: Self-pay

## 2019-09-14 ENCOUNTER — Other Ambulatory Visit (HOSPITAL_COMMUNITY)
Admission: RE | Admit: 2019-09-14 | Discharge: 2019-09-14 | Disposition: A | Discharge status: Home / Self Care | Payer: 59 | Source: Ambulatory Visit | Attending: Internal Medicine | Admitting: Internal Medicine

## 2019-09-14 ENCOUNTER — Encounter: Payer: Self-pay | Admitting: Internal Medicine

## 2019-09-14 ENCOUNTER — Other Ambulatory Visit: Payer: Self-pay

## 2019-09-14 ENCOUNTER — Ambulatory Visit (INDEPENDENT_AMBULATORY_CARE_PROVIDER_SITE_OTHER): Payer: 59 | Admitting: Internal Medicine

## 2019-09-14 VITALS — BP 128/72 | HR 75 | Temp 98.0°F | Ht 65.0 in | Wt 311.8 lb

## 2019-09-14 DIAGNOSIS — Z1329 Encounter for screening for other suspected endocrine disorder: Secondary | ICD-10-CM | POA: Diagnosis not present

## 2019-09-14 DIAGNOSIS — Z23 Encounter for immunization: Secondary | ICD-10-CM

## 2019-09-14 DIAGNOSIS — Z124 Encounter for screening for malignant neoplasm of cervix: Secondary | ICD-10-CM

## 2019-09-14 DIAGNOSIS — R7303 Prediabetes: Secondary | ICD-10-CM

## 2019-09-14 DIAGNOSIS — I10 Essential (primary) hypertension: Secondary | ICD-10-CM | POA: Diagnosis not present

## 2019-09-14 DIAGNOSIS — L304 Erythema intertrigo: Secondary | ICD-10-CM

## 2019-09-14 DIAGNOSIS — Z0000 Encounter for general adult medical examination without abnormal findings: Secondary | ICD-10-CM | POA: Diagnosis not present

## 2019-09-14 DIAGNOSIS — Z1389 Encounter for screening for other disorder: Secondary | ICD-10-CM

## 2019-09-14 MED ORDER — NYSTATIN 100000 UNIT/GM EX CREA: 1.0000 "application " | TOPICAL_CREAM | Freq: Two times a day (BID) | CUTANEOUS | 12 refills | Status: DC

## 2019-09-14 MED ORDER — HYDROCORTISONE 2.5 % EX CREA: TOPICAL_CREAM | Freq: Two times a day (BID) | CUTANEOUS | 12 refills | Status: DC

## 2019-09-14 NOTE — Progress Notes (Addendum)
Chief Complaint  Patient presents with  . Annual Exam   Annual  1. HTN BP on maxzide controlled 37.5-25 mg qd  2. Prediabetes A1C 6.2 05/31/2019 working with personal trainer 2x per week and trying to eat right wants to lose more weight    Review of Systems  Constitutional: Negative for weight loss.  HENT: Negative for hearing loss.   Eyes: Negative for blurred vision.  Respiratory: Negative for shortness of breath.   Cardiovascular: Negative for chest pain.  Gastrointestinal: Negative for abdominal pain.  Musculoskeletal: Negative for falls.  Skin: Negative for rash.  Neurological: Negative for headaches.  Psychiatric/Behavioral: Negative for depression.   Past Medical History:  Diagnosis Date  . Anxiety    panic/anxiety 12/2010 celexa 20 is helping  . High cholesterol   . Hypertension   . Osteopenia    Past Surgical History:  Procedure Laterality Date  . ABDOMINAL HYSTERECTOMY  2010   partial both ovaries out  . COLONOSCOPY  2015   [facility]  . SALPINGOOPHORECTOMY Bilateral 07/24/2015   Procedure: ATTEMPTED LAPAROSCOPY, ABDOMINAL BILATERAL SALPINGO OOPHORECTOMY ;  Surgeon: Rubie Maid, MD;  Location: [facility] ORS;  Service: Gynecology;  Laterality: Bilateral;  . TONSILLECTOMY    . TUBAL LIGATION Bilateral 1988   Family History  Problem Relation Age of Onset  . Cancer Mother   . Breast cancer Mother 57  . Alcohol abuse Father   . Renal Disease Sister        on HD   Social History   Socioeconomic History  . Marital status: Divorced    Spouse name: Not on file  . Number of children: Not on file  . Years of education: Not on file  . Highest education level: Not on file  Occupational History  . Not on file  Social Needs  . Financial resource strain: Not on file  . Food insecurity    Worry: Not on file    Inability: Not on file  . Transportation needs    Medical: Not on file    Non-medical: Not on file  Tobacco Use  . Smoking status: Former Smoker     Packs/day: 0.25    Years: 20.00    Pack years: 5.00    Quit date: 07/12/2008    Years since quitting: 11.1  . Smokeless tobacco: Never Used  Substance and Sexual Activity  . Alcohol use: Yes    Alcohol/week: 0.0 - 1.0 standard drinks  . Drug use: No  . Sexual activity: Not Currently    Birth control/protection: Surgical  Lifestyle  . Physical activity    Days per week: Not on file    Minutes per session: Not on file  . Stress: Not on file  Relationships  . Social Herbalist on phone: Not on file    Gets together: Not on file    Attends religious service: Not on file    Active member of club or organization: Not on file    Attends meetings of clubs or organizations: Not on file    Relationship status: Not on file  . Intimate partner violence    Fear of current or ex partner: Not on file    Emotionally abused: Not on file    Physically abused: Not on file    Forced sexual activity: Not on file  Other Topics Concern  . 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 atorvastatin (LIPITOR) 20 MG tablet TAKE 1 TABLET BY MOUTH AT  BEDTIME  . citalopram (CELEXA) 20 MG tablet Take 1 tablet (20 mg total) by mouth daily.  Marland Kitchen nystatin-triamcinolone ointment (MYCOLOG) Apply 1 application topically 2 (two) times daily.  Marland Kitchen triamterene-hydrochlorothiazide (MAXZIDE-25) 37.5-25 MG tablet Take 1 tablet by mouth daily.   No Known Allergies No results found for this or any previous visit (from the past 2160 hour(s)). Objective  Body mass index is 51.89 kg/m. Wt Readings from Last 3 Encounters:  09/14/19 (!) 311 lb 12.8 oz (141.4 kg)  03/31/19 294 lb (133.4 kg)  08/03/18 (!) 301 lb 4.8 oz (136.7 kg)   Temp Readings from Last 3 Encounters:  09/14/19 98 F (36.7 C) (Oral)  08/03/18 98.5 F (36.9 C) (Oral)  07/15/18 98 F (36.7 C) (Oral)   BP Readings from Last 3 Encounters:  09/14/19 128/72  03/31/19 138/81   08/03/18 132/64   Pulse Readings from Last 3 Encounters:  09/14/19 75  03/31/19 81  08/03/18 86    Physical Exam Vitals signs and nursing note reviewed. Exam conducted with a chaperone present.  Constitutional:      Appearance: Normal appearance. She is well-developed and well-groomed. She is morbidly obese.  HENT:     Head: Normocephalic and atraumatic.     Comments: +mask on     Right Ear: External ear normal.  Cardiovascular:     Rate and Rhythm: Normal rate and regular rhythm.     Heart sounds: Normal heart sounds. No murmur.  Pulmonary:     Effort: Pulmonary effort is normal.     Breath sounds: Normal breath sounds.  Chest:     Breasts:        Right: Normal. No swelling, bleeding, inverted nipple, mass, nipple discharge, skin change or tenderness.        Left: Normal. No swelling, bleeding, inverted nipple, mass, nipple discharge, skin change or tenderness.  Genitourinary:    Pubic Area: No rash.      Labia:        Right: No rash.      Vagina: Normal.     Cervix: Normal.     Uterus: Normal.      Adnexa: Right adnexa normal and left adnexa normal.  Lymphadenopathy:     Upper Body:     Right upper body: No axillary adenopathy.     Left upper body: No axillary adenopathy.  Skin:    General: Skin is warm and dry.  Neurological:     General: No focal deficit present.     Mental Status: She is alert and oriented to person, place, and time. Mental status is at baseline.     Gait: Gait normal.  Psychiatric:        Attention and Perception: Attention and perception normal.        Mood and Affect: Mood and affect normal.        Speech: Speech normal.        Behavior: Behavior normal. Behavior is cooperative.        Thought Content: Thought content normal.        Cognition and Memory: Cognition and memory normal.        Judgment: Judgment normal.     Assessment  Plan  Annual physical exam rec healthy diet and exercise flu shot given today tdap and shingrix utd   MMR immune   Hep C neg 09/02/17  Declines HIV  Pap neg 8/2017done today [place] referred sch 09/20/19 colonoscopy had in [place] 08/2014 due in 2025   05/31/19 [facility] labs  A1C 6.2  Cr 0.75  TC 157, TG 74, HDL 51, LDL 91   Wt 313 lbs ht 65 inches Waist circum 52.5  BMI 52.1  BP 114/59 Glucose 102  COVID 19 negative  Essential hypertension - Plan: Comprehensive metabolic panel, CBC with Differential/Platelet, Urinalysis, Routine w reflex microscopic Cont meds   Prediabetes - Plan: Hemoglobin A1c -cont healthy diet and exercise      Provider: Dr. Olivia Mackie McLean-Scocuz[facility]

## 2019-09-14 NOTE — Patient Instructions (Addendum)
The next 56 days or new you [facility] nutrition course  Keep up the great work   [address] [employer] Exercise is structured, repetitive physical activity to improve fitness and health. Getting regular exercise is important for everyone. It is especially important if you are overweight. Being overweight increases your risk of heart disease, stroke, diabetes, high blood pressure, and several types of cancer. Reducing your calorie intake and exercising can help you lose weight. Exercise is usually categorized as moderate or vigorous intensity. To lose weight, most people need to do a certain amount of moderate-intensity or vigorous-intensity exercise each week. Moderate-intensity exercise  Moderate-intensity exercise is any activity that gets you moving enough to burn at least three times more energy (calories) than if you were sitting. Examples of moderate exercise include:  Walking a mile in 15 minutes.  Doing light yard work.  Biking at an easy pace. Most people should get at least 150 minutes (2 hours and 30 minutes) a week of moderate-intensity exercise to maintain their body weight. Vigorous-intensity exercise Vigorous-intensity exercise is any activity that gets you moving enough to burn at least six times more calories than if you were sitting. When you exercise at this intensity, you should be working hard enough that you are not able to carry on a conversation. Examples of vigorous exercise include:  Running.  Playing a team sport, such as football, basketball, and soccer.  Jumping rope. Most people should get at least 75 minutes (1 hour and 15 minutes) a week of vigorous-intensity exercise to maintain their body weight. How can exercise affect me? When you exercise enough to burn more calories than you eat, you lose weight. Exercise also reduces body fat and builds muscle. The more muscle you have, the more calories you burn. Exercise also:   Improves mood.  Reduces stress and tension.  Improves your overall fitness, flexibility, and endurance.  Increases bone strength. The amount of exercise you need to lose weight depends on:  Your age.  The type of exercise.  Any health conditions you have.  Your overall physical ability. Talk to your health care provider about how much exercise you need and what types of activities are safe for you. What actions can I take to lose weight? Nutrition   Make changes to your diet as told by your health care provider or diet and nutrition specialist (dietitian). This may include: ? Eating fewer calories. ? Eating more protein. ? Eating less unhealthy fats. ? Eating a diet that includes fresh fruits and vegetables, whole grains, low-fat dairy products, and lean protein. ? Avoiding foods with added fat, salt, and sugar.  Drink plenty of water while you exercise to prevent dehydration or heat stroke. Activity  Choose an activity that you enjoy and set realistic goals. Your health care provider can help you make an exercise plan that works for you.  Exercise at a moderate or vigorous intensity most days of the week. ? The intensity of exercise may vary from person to person. You can tell how intense a workout is for you by paying attention to your breathing and heartbeat. Most people will notice their breathing and heartbeat get faster with more intense exercise.  Do resistance training twice each week, such as: ? Push-ups. ? Sit-ups. ? Lifting weights. ? Using resistance bands.  Getting short amounts of exercise can be just as helpful as long structured periods of exercise. If you have trouble finding time to exercise, try  to include exercise in your daily routine. ? Get up, stretch, and walk around every 30 minutes throughout the day. ? Go for a walk during your lunch break. ? Park your car farther away from your destination. ? If you take public transportation, get off one stop  early and walk the rest of the way. ? Make phone calls while standing up and walking around. ? Take the stairs instead of elevators or escalators.  Wear comfortable clothes and shoes with good support.  Do not exercise so much that you hurt yourself, feel dizzy, or get very short of breath. Where to find more information  U.S. Department of Health and Human Services: ThisPath.fi  Centers for Disease Control and Prevention (CDC): FootballExhibition.com.br Contact a health care provider:  Before starting a new exercise program.  If you have questions or concerns about your weight.  If you have a medical problem that keeps you from exercising. Get help right away if you have any of the following while exercising:  Injury.  Dizziness.  Difficulty breathing or shortness of breath that does not go away when you stop exercising.  Chest pain.  Rapid heartbeat. Summary  Being overweight increases your risk of heart disease, stroke, diabetes, high blood pressure, and several types of cancer.  Losing weight happens when you burn more calories than you eat.  Reducing the amount of calories you eat in addition to getting regular moderate or vigorous exercise each week helps you lose weight. This information is not intended to replace advice given to you by your health care provider. Make sure you discuss any questions you have with your health care provider. Document Released: 01/11/2011 Document Revised: 12/22/2017 Document Reviewed: 12/22/2017 Elsevier Patient Education  2020 [employer].

## 2019-09-14 NOTE — Progress Notes (Signed)
Pre visit review using our clinic review tool, if applicable. No additional management support is needed unless otherwise documented below in the visit note. 

## 2019-09-15 LAB — COMPREHENSIVE METABOLIC PANEL
ALT: 20 IU/L (ref 0–32)
AST: 18 IU/L (ref 0–40)
Albumin/Globulin Ratio: 1.7 (ref 1.2–2.2)
Albumin: 4.4 g/dL (ref 3.8–4.9)
Alkaline Phosphatase: 66 IU/L (ref 39–117)
BUN/Creatinine Ratio: 17 (ref 9–23)
BUN: 11 mg/dL (ref 6–24)
Bilirubin Total: 0.4 mg/dL (ref 0.0–1.2)
CO2: 27 mmol/L (ref 20–29)
Calcium: 9.8 mg/dL (ref 8.7–10.2)
Chloride: 102 mmol/L (ref 96–106)
Creatinine, Ser: 0.64 mg/dL (ref 0.57–1.00)
GFR calc Af Amer: 116 mL/min/{1.73_m2} (ref >59)
GFR calc non Af Amer: 101 mL/min/{1.73_m2} (ref >59)
Globulin, Total: 2.6 g/dL (ref 1.5–4.5)
Glucose: 96 mg/dL (ref 65–99)
Potassium: 4.2 mmol/L (ref 3.5–5.2)
Sodium: 144 mmol/L (ref 134–144)
Total Protein: 7 g/dL (ref 6.0–8.5)

## 2019-09-15 LAB — CBC WITH DIFFERENTIAL/PLATELET
Basophils Absolute: 0 10*3/uL (ref 0.0–0.2)
Basos: 1 %
EOS (ABSOLUTE): 0.2 10*3/uL (ref 0.0–0.4)
Eos: 3 %
Hematocrit: 41 % (ref 34.0–46.6)
Hemoglobin: 13.2 g/dL (ref 11.1–15.9)
Immature Grans (Abs): 0 10*3/uL (ref 0.0–0.1)
Immature Granulocytes: 0 %
Lymphocytes Absolute: 1.8 10*3/uL (ref 0.7–3.1)
Lymphs: 30 %
MCH: 25.9 pg — ABNORMAL LOW (ref 26.6–33.0)
MCHC: 32.2 g/dL (ref 31.5–35.7)
MCV: 80 fL (ref 79–97)
Monocytes Absolute: 0.6 10*3/uL (ref 0.1–0.9)
Monocytes: 10 %
Neutrophils Absolute: 3.3 10*3/uL (ref 1.4–7.0)
Neutrophils: 56 %
Platelets: 205 10*3/uL (ref 150–450)
RBC: 5.1 x10E6/uL (ref 3.77–5.28)
RDW: 14.2 % (ref 11.7–15.4)
WBC: 5.9 10*3/uL (ref 3.4–10.8)

## 2019-09-15 LAB — CYTOLOGY - PAP
Diagnosis: NEGATIVE
High risk HPV: NEGATIVE
Molecular Disclaimer: 56
Molecular Disclaimer: DETECTED
Molecular Disclaimer: NORMAL

## 2019-09-15 LAB — URINALYSIS, ROUTINE W REFLEX MICROSCOPIC
Bilirubin, UA: NEGATIVE
Glucose, UA: NEGATIVE
Ketones, UA: NEGATIVE
Leukocytes,UA: NEGATIVE
Nitrite, UA: NEGATIVE
Protein,UA: NEGATIVE
RBC, UA: NEGATIVE
Specific Gravity, UA: 1.022 (ref 1.005–1.030)
Urobilinogen, Ur: 0.2 mg/dL (ref 0.2–1.0)
pH, UA: 5.5 (ref 5.0–7.5)

## 2019-09-15 LAB — HEMOGLOBIN A1C
Est. average glucose Bld gHb Est-mCnc: 128 mg/dL
Hgb A1c MFr Bld: 6.1 % — ABNORMAL HIGH (ref 4.8–5.6)

## 2019-09-15 LAB — TSH: TSH: 1.35 u[IU]/mL (ref 0.450–4.500)

## 2019-09-20 ENCOUNTER — Ambulatory Visit
Admission: RE | Admit: 2019-09-20 | Discharge: 2019-09-20 | Disposition: A | Discharge status: Home / Self Care | Payer: 59 | Source: Ambulatory Visit | Attending: Internal Medicine | Admitting: Internal Medicine

## 2019-09-20 DIAGNOSIS — Z1231 Encounter for screening mammogram for malignant neoplasm of breast: Secondary | ICD-10-CM | POA: Insufficient documentation

## 2019-09-21 ENCOUNTER — Emergency Department
Admission: EM | Admit: 2019-09-21 | Discharge: 2019-09-21 | Disposition: A | Discharge status: Home / Self Care | Payer: 59 | Attending: Emergency Medicine | Admitting: Emergency Medicine

## 2019-09-21 ENCOUNTER — Emergency Department: Payer: 59

## 2019-09-21 ENCOUNTER — Encounter: Payer: Self-pay | Admitting: Intensive Care

## 2019-09-21 ENCOUNTER — Other Ambulatory Visit: Payer: Self-pay

## 2019-09-21 DIAGNOSIS — Z79899 Other long term (current) drug therapy: Secondary | ICD-10-CM | POA: Diagnosis not present

## 2019-09-21 DIAGNOSIS — M79642 Pain in left hand: Secondary | ICD-10-CM | POA: Diagnosis present

## 2019-09-21 DIAGNOSIS — Z87891 Personal history of nicotine dependence: Secondary | ICD-10-CM | POA: Insufficient documentation

## 2019-09-21 DIAGNOSIS — I10 Essential (primary) hypertension: Secondary | ICD-10-CM | POA: Insufficient documentation

## 2019-09-21 DIAGNOSIS — G5602 Carpal tunnel syndrome, left upper limb: Secondary | ICD-10-CM | POA: Diagnosis not present

## 2019-09-21 MED ORDER — OXYCODONE-ACETAMINOPHEN 5-325 MG PO TABS
1.0000 | ORAL_TABLET | Freq: Once | ORAL | Status: AC
  Administered 2019-09-21: 1 via ORAL
  Filled 2019-09-21: qty 1

## 2019-09-21 MED ORDER — MELOXICAM 15 MG PO TABS: 15.0000 mg | ORAL_TABLET | Freq: Every day | ORAL | 0 refills | Status: DC

## 2019-09-21 MED ORDER — HYDROCODONE-ACETAMINOPHEN 5-325 MG PO TABS: 1.0000 | ORAL_TABLET | ORAL | 0 refills | Status: DC | PRN

## 2019-09-21 MED ORDER — KETOROLAC TROMETHAMINE 30 MG/ML IJ SOLN
30.0000 mg | Freq: Once | INTRAMUSCULAR | Status: AC
  Administered 2019-09-21: 30 mg via INTRAMUSCULAR
  Filled 2019-09-21: qty 1

## 2019-09-21 NOTE — ED Provider Notes (Signed)
[facility] Emergency Department Provider Note  ____________________________________________  Time seen: Approximately 3:27 PM  I have reviewed the triage vital signs and the nursing notes.   HISTORY  Chief Complaint Hand Pain (left)    HPI Christine Michael is a 53 y.o. female who presents the emergency department complaining of intense left hand pain.  Patient reports that she woke up yesterday with a throbbing sensation in her hand.  Patient reports that she has a history of carpal tunnel and she does significant amounts of typing.  Patient thought it was her carpal tunnel so she applied a soft brace that she had at home.  Patient reports that the pain is only intensified.  She denies any numbness or tingling in her fingers but endorses pain in her typical carpal tunnel distribution.  No gross erythema or edema is reported.  No history of gout.  No trauma to the hand or wrist.  Patient denies any other complaints at this time.         Past Medical History:  Diagnosis Date  . Anxiety    panic/anxiety 12/2010 celexa 20 is helping  . High cholesterol   . Hypertension   . Osteopenia     Patient Active Problem List   Diagnosis Date Noted  . Osteopenia   . Intertrigo 07/15/2018  . Morbid obesity ([place]) 04/26/2018  . Prediabetes 04/11/2018  . Medication monitoring encounter 04/09/2018  . Anxiety 02/07/2017  . Left foot pain 01/29/2017  . Well woman exam with routine gynecological exam 07/29/2016  . Family history of breast cancer in mother 07/29/2016  . BPPV (benign paroxysmal positional vertigo) 06/10/2016  . Hypertension 09/12/2015  . High cholesterol 09/12/2015  . Hyperglycemia 09/12/2015  . Annual physical exam 08/08/2015  . 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 ABDOMINAL HYSTERECTOMY  2010   partial both ovaries out  . COLONOSCOPY  2015   [facility]  . SALPINGOOPHORECTOMY Bilateral 07/24/2015    Procedure: ATTEMPTED LAPAROSCOPY, ABDOMINAL BILATERAL SALPINGO OOPHORECTOMY ;  Surgeon: Rubie Maid, MD;  Location: [facility] ORS;  Service: Gynecology;  Laterality: Bilateral;  . TONSILLECTOMY    . TUBAL LIGATION Bilateral 1988    Prior to Admission medications   Medication Sig Start Date End Date Taking? Authorizing Provider  atorvastatin (LIPITOR) 20 MG tablet TAKE 1 TABLET BY MOUTH AT  BEDTIME 04/28/19   Hubbard Hartshorn, FNP  citalopram (CELEXA) 20 MG tablet Take 1 tablet (20 mg total) by mouth daily. 07/15/19   McLean-Scocuzza, Nino Glow, MD  HYDROcodone-acetaminophen (NORCO/VICODIN) 5-325 MG tablet Take 1 tablet by mouth every 4 (four) hours as needed for moderate pain. 09/21/19   Mateya Torti, Charline Bills, PA-C  hydrocortisone 2.5 % cream Apply topically 2 (two) times daily. 09/14/19   McLean-Scocuzza, Nino Glow, MD  meloxicam (MOBIC) 15 MG tablet Take 1 tablet (15 mg total) by mouth daily. 09/21/19   Sitara Cashwell, Charline Bills, PA-C  nystatin cream (MYCOSTATIN) Apply 1 application topically 2 (two) times daily. 09/14/19   McLean-Scocuzza, Nino Glow, MD  triamterene-hydrochlorothiazide (MAXZIDE-25) 37.5-25 MG tablet Take 1 tablet by mouth daily. 06/11/19   McLean-Scocuzza, Nino Glow, MD    Allergies Patient has no known allergies.  Family History  Problem Relation Age of Onset  . Cancer Mother   . Breast cancer Mother 21  . Alcohol abuse Father   . Renal Disease Sister        on HD    Social History  Social History   Tobacco Use  . Smoking status: Former Smoker    Packs/day: 0.25    Years: 20.00    Pack years: 5.00    Quit date: 07/12/2008    Years since quitting: 11.2  . Smokeless tobacco: Never Used  Substance Use Topics  . Alcohol use: Yes    Alcohol/week: 0.0 - 1.0 standard drinks  . Drug use: No     Review of Systems  Constitutional: No fever/chills Eyes: No visual changes. No discharge ENT: No upper respiratory complaints. Cardiovascular: no chest pain. Respiratory: no cough. No  SOB. Gastrointestinal: No abdominal pain.  No nausea, no vomiting. Musculoskeletal: Positive for left hand pain Skin: Negative for rash, abrasions, lacerations, ecchymosis. Neurological: Negative for headaches, focal weakness or numbness. 10-point ROS otherwise negative.  ____________________________________________   PHYSICAL EXAM:  VITAL SIGNS: ED Triage Vitals  Enc Vitals Group     BP 09/21/19 1452 (!) 139/56     Pulse Rate 09/21/19 1452 85     Resp 09/21/19 1452 16     Temp 09/21/19 1452 98.6 F (37 C)     Temp Source 09/21/19 1452 Oral     SpO2 09/21/19 1452 98 %     Weight 09/21/19 1449 300 lb (136.1 kg)     Height 09/21/19 1449 5\' 5"  (1.651 m)     Head Circumference --      Peak Flow --      Pain Score 09/21/19 1449 10     Pain Loc --      Pain Edu? --      Excl. in GC? --      Constitutional: Alert and oriented. Well appearing and in no acute distress. Eyes: Conjunctivae are normal. PERRL. EOMI. Head: Atraumatic. Neck: No stridor.    Cardiovascular: Normal rate, regular rhythm. Normal S1 and S2.  Good peripheral circulation. Respiratory: Normal respiratory effort without tachypnea or retractions. Lungs CTAB. Good air entry to the bases with no decreased or absent breath sounds. Musculoskeletal: Full range of motion to all extremities. No gross deformities appreciated.  Visualization of the left hand reveals no gross erythema, edema or ecchymosis.  No deformity.  Patient is able to flex and extend the fingers appropriately but doing so increases her pain limiting full range of motion.  Patient is tender to palpation along the extensor tendons at the level of the wrist as well as palpation over the flexor tendons at the level of the wrist.  Positive Tinel's and Phalen's.  Sensation intact all 5 digits.  Capillary refill less than 2 seconds all digits. Neurologic:  Normal speech and language. No gross focal neurologic deficits are appreciated.  Skin:  Skin is warm, dry  and intact. No rash noted. Psychiatric: Mood and affect are normal. Speech and behavior are normal. Patient exhibits appropriate insight and judgement.   ____________________________________________   LABS (all labs ordered are listed, but only abnormal results are displayed)  Labs Reviewed - No data to display ____________________________________________  EKG   ____________________________________________  RADIOLOGY  Mammogram results ordered by another provider, inserted into chart via smart phrase for imaging today.  I personally viewed and evaluated these images as part of my medical decision making, as well as reviewing the written report by the radiologist.  Dg Hand Complete Left  Result Date: 09/21/2019 CLINICAL DATA:  Left hand pain and swelling. EXAM: LEFT HAND - COMPLETE 3+ VIEW COMPARISON:  None. FINDINGS: There is no evidence of fracture or dislocation. There is no  evidence of arthropathy or other focal bone abnormality. Soft tissues are unremarkable. IMPRESSION: Negative. Electronically Signed   By: Lupita RaiderJames  Green Jr M.D.   On: 09/21/2019 15:18   Mm 3d Screen Breast Bilateral  Result Date: 09/21/2019 CLINICAL DATA:  Screening. EXAM: DIGITAL SCREENING BILATERAL MAMMOGRAM WITH TOMO AND CAD COMPARISON:  Previous exam(s). ACR Breast Density Category b: There are scattered areas of fibroglandular density. FINDINGS: There are no findings suspicious for malignancy. Images were processed with CAD. IMPRESSION: No mammographic evidence of malignancy. A result letter of this screening mammogram will be mailed directly to the patient. RECOMMENDATION: Screening mammogram in one year. (Code:SM-B-01Y) BI-RADS CATEGORY  1: Negative. Electronically Signed   By: Norva PavlovElizabeth  Brown M.D.   On: 09/21/2019 09:46    ____________________________________________    PROCEDURES  Procedure(s) performed:    Procedures    Medications  ketorolac (TORADOL) 30 MG/ML injection 30 mg (has no  administration in time range)  oxyCODONE-acetaminophen (PERCOCET/ROXICET) 5-325 MG per tablet 1 tablet (has no administration in time range)     ____________________________________________   INITIAL IMPRESSION / ASSESSMENT AND PLAN / ED COURSE  Pertinent labs & imaging results that were available during my care of the patient were reviewed by me and considered in my medical decision making (see chart for details).  Review of the [place] CSRS was performed in accordance of the [facility] prior to dispensing any controlled drugs.           Patient's diagnosis is consistent with carpal tunnel left hand.  Patient presented to the emergency department with throbbing pain about the left wrist and hand.  Patient has a history of carpal tunnel syndrome.  She applied a soft brace prior to arrival but no medications.  X-ray reveals no acute findings.  No history of trauma.  No erythema or edema to suggest infection or gout.  Patient will be placed in a splint, given Toradol and Percocet here in the emergency department.. Patient will be discharged home with prescriptions for meloxicam, limited prescription of Norco. Patient is to follow up with primary care or orthopedics.  As needed or otherwise directed. Patient is given ED precautions to return to the ED for any worsening or new symptoms.     ____________________________________________  FINAL CLINICAL IMPRESSION(S) / ED DIAGNOSES  Final diagnoses:  Carpal tunnel syndrome of left wrist  Pain of left hand      NEW MEDICATIONS STARTED DURING THIS VISIT:  ED Discharge Orders         Ordered    meloxicam (MOBIC) 15 MG tablet  Daily     09/21/19 1542    HYDROcodone-acetaminophen (NORCO/VICODIN) 5-325 MG tablet  Every 4 hours PRN     09/21/19 1542              This chart was dictated using voice recognition software/Dragon. Despite best efforts to proofread, errors can occur which can change the meaning. Any change was purely  unintentional.    Racheal PatchesCuthriell, Uma Jerde D, PA-C 09/21/19 1542    Phineas SemenGoodman, [place], MD 09/21/19 [phone]

## 2019-09-21 NOTE — ED Notes (Signed)
Pt drove self

## 2019-09-21 NOTE — ED Triage Notes (Signed)
PAtient c/o left hand pain that started this AM. Minimal swelling. Denies injury. Patient has brace present on hand

## 2019-12-21 ENCOUNTER — Ambulatory Visit: Payer: 59 | Attending: Internal Medicine

## 2019-12-21 DIAGNOSIS — Z20822 Contact with and (suspected) exposure to covid-19: Secondary | ICD-10-CM

## 2019-12-23 LAB — NOVEL CORONAVIRUS, NAA: SARS-CoV-2, NAA: NOT DETECTED

## 2019-12-30 ENCOUNTER — Other Ambulatory Visit: Payer: Self-pay | Admitting: Family Medicine

## 2019-12-30 DIAGNOSIS — E7800 Pure hypercholesterolemia, unspecified: Secondary | ICD-10-CM

## 2019-12-31 NOTE — Telephone Encounter (Signed)
Requested medication (s) are due for refill today: yes  Requested medication (s) are on the active medication list: yes  Last refill:  11/09/2019  Future visit scheduled: yes  Notes to clinic:  medication last filled by different provider  Review for refill   Requested Prescriptions  Pending Prescriptions Disp Refills   atorvastatin (LIPITOR) 20 MG tablet [Pharmacy Med Name: ATORVASTATIN  20MG   TAB] 90 tablet 3    Sig: TAKE 1 TABLET BY MOUTH AT  BEDTIME      Cardiovascular:  Antilipid - Statins Failed - 12/30/2019  9:40 PM      Failed - Total Cholesterol in normal range and within 360 days    Cholesterol, Total  Date Value Ref Range Status  04/09/2018 163 100 - 199 mg/dL Final          Failed - LDL in normal range and within 360 days    LDL Calculated  Date Value Ref Range Status  04/09/2018 88 0 - 99 mg/dL Final          Failed - HDL in normal range and within 360 days    HDL  Date Value Ref Range Status  04/09/2018 51 >39 mg/dL Final          Failed - Triglycerides in normal range and within 360 days    Triglycerides  Date Value Ref Range Status  04/09/2018 121 0 - 149 mg/dL Final          Passed - Patient is not pregnant      Passed - Valid encounter within last 12 months    Recent Outpatient Visits           9 months ago Essential hypertension   [facility] [place], [facility], MD   1 year ago Acute hemorrhagic cystitis   [facility] [place], [facility], MD   1 year ago Annual physical exam   [facility] [place], [facility], MD   1 year ago Anxiety   [facility], NP   1 year ago Essential hypertension   [facility] [place], [facility], MD       Future Appointments             In 2 months McLean-Scocuzza, Janit Bern, MD [facility], [facility]

## 2020-01-07 ENCOUNTER — Ambulatory Visit: Payer: 59 | Attending: Internal Medicine

## 2020-01-07 DIAGNOSIS — Z20822 Contact with and (suspected) exposure to covid-19: Secondary | ICD-10-CM

## 2020-01-08 LAB — NOVEL CORONAVIRUS, NAA: SARS-CoV-2, NAA: NOT DETECTED

## 2020-03-15 ENCOUNTER — Other Ambulatory Visit: Payer: Self-pay

## 2020-03-17 ENCOUNTER — Other Ambulatory Visit: Payer: Self-pay

## 2020-03-17 ENCOUNTER — Encounter: Payer: Self-pay | Admitting: Internal Medicine

## 2020-03-17 ENCOUNTER — Ambulatory Visit (INDEPENDENT_AMBULATORY_CARE_PROVIDER_SITE_OTHER): Payer: 59 | Admitting: Internal Medicine

## 2020-03-17 VITALS — BP 126/78 | HR 92 | Temp 97.2°F | Ht 65.0 in | Wt 314.2 lb

## 2020-03-17 DIAGNOSIS — R7303 Prediabetes: Secondary | ICD-10-CM | POA: Diagnosis not present

## 2020-03-17 DIAGNOSIS — G4733 Obstructive sleep apnea (adult) (pediatric): Secondary | ICD-10-CM | POA: Insufficient documentation

## 2020-03-17 DIAGNOSIS — Z1389 Encounter for screening for other disorder: Secondary | ICD-10-CM | POA: Diagnosis not present

## 2020-03-17 DIAGNOSIS — I10 Essential (primary) hypertension: Secondary | ICD-10-CM

## 2020-03-17 DIAGNOSIS — F419 Anxiety disorder, unspecified: Secondary | ICD-10-CM

## 2020-03-17 DIAGNOSIS — Z1231 Encounter for screening mammogram for malignant neoplasm of breast: Secondary | ICD-10-CM

## 2020-03-17 DIAGNOSIS — Z1329 Encounter for screening for other suspected endocrine disorder: Secondary | ICD-10-CM | POA: Diagnosis not present

## 2020-03-17 DIAGNOSIS — Z9989 Dependence on other enabling machines and devices: Secondary | ICD-10-CM

## 2020-03-17 MED ORDER — CITALOPRAM HYDROBROMIDE 20 MG PO TABS: 20.0000 mg | ORAL_TABLET | Freq: Every day | ORAL | 3 refills | Status: DC

## 2020-03-17 MED ORDER — TRIAMTERENE-HCTZ 37.5-25 MG PO TABS: 1.0000 | ORAL_TABLET | Freq: Every day | ORAL | 3 refills | Status: DC

## 2020-03-17 NOTE — Patient Instructions (Addendum)
Centrum >50 women mvt or nature made  Debrox ear wax drops otc   Exercising to Lose Weight Exercise is structured, repetitive physical activity to improve fitness and health. Getting regular exercise is important for everyone. It is especially important if you are overweight. Being overweight increases your risk of heart disease, stroke, diabetes, high blood pressure, and several types of cancer. Reducing your calorie intake and exercising can help you lose weight. Exercise is usually categorized as moderate or vigorous intensity. To lose weight, most people need to do a certain amount of moderate-intensity or vigorous-intensity exercise each week. Moderate-intensity exercise  Moderate-intensity exercise is any activity that gets you moving enough to burn at least three times more energy (calories) than if you were sitting. Examples of moderate exercise include:  Walking a mile in 15 minutes.  Doing light yard work.  Biking at an easy pace. Most people should get at least 150 minutes (2 hours and 30 minutes) a week of moderate-intensity exercise to maintain their body weight. Vigorous-intensity exercise Vigorous-intensity exercise is any activity that gets you moving enough to burn at least six times more calories than if you were sitting. When you exercise at this intensity, you should be working hard enough that you are not able to carry on a conversation. Examples of vigorous exercise include:  Running.  Playing a team sport, such as football, basketball, and soccer.  Jumping rope. Most people should get at least 75 minutes (1 hour and 15 minutes) a week of vigorous-intensity exercise to maintain their body weight. How can exercise affect me? When you exercise enough to burn more calories than you eat, you lose weight. Exercise also reduces body fat and builds muscle. The more muscle you have, the more calories you burn. Exercise also:  Improves mood.  Reduces stress and  tension.  Improves your overall fitness, flexibility, and endurance.  Increases bone strength. The amount of exercise you need to lose weight depends on:  Your age.  The type of exercise.  Any health conditions you have.  Your overall physical ability. Talk to your health care provider about how much exercise you need and what types of activities are safe for you. What actions can I take to lose weight? Nutrition   Make changes to your diet as told by your health care provider or diet and nutrition specialist (dietitian). This may include: ? Eating fewer calories. ? Eating more protein. ? Eating less unhealthy fats. ? Eating a diet that includes fresh fruits and vegetables, whole grains, low-fat dairy products, and lean protein. ? Avoiding foods with added fat, salt, and sugar.  Drink plenty of water while you exercise to prevent dehydration or heat stroke. Activity  Choose an activity that you enjoy and set realistic goals. Your health care provider can help you make an exercise plan that works for you.  Exercise at a moderate or vigorous intensity most days of the week. ? The intensity of exercise may vary from person to person. You can tell how intense a workout is for you by paying attention to your breathing and heartbeat. Most people will notice their breathing and heartbeat get faster with more intense exercise.  Do resistance training twice each week, such as: ? Push-ups. ? Sit-ups. ? Lifting weights. ? Using resistance bands.  Getting short amounts of exercise can be just as helpful as long structured periods of exercise. If you have trouble finding time to exercise, try to include exercise in your daily routine. ? Get  up, stretch, and walk around every 30 minutes throughout the day. ? Go for a walk during your lunch break. ? Park your car farther away from your destination. ? If you take public transportation, get off one stop early and walk the rest of the  way. ? Make phone calls while standing up and walking around. ? Take the stairs instead of elevators or escalators.  Wear comfortable clothes and shoes with good support.  Do not exercise so much that you hurt yourself, feel dizzy, or get very short of breath. Where to find more information  U.S. Department of Health and Human Services: ThisPath.fi  Centers for Disease Control and Prevention (CDC): FootballExhibition.com.br Contact a health care provider:  Before starting a new exercise program.  If you have questions or concerns about your weight.  If you have a medical problem that keeps you from exercising. Get help right away if you have any of the following while exercising:  Injury.  Dizziness.  Difficulty breathing or shortness of breath that does not go away when you stop exercising.  Chest pain.  Rapid heartbeat. Summary  Being overweight increases your risk of heart disease, stroke, diabetes, high blood pressure, and several types of cancer.  Losing weight happens when you burn more calories than you eat.  Reducing the amount of calories you eat in addition to getting regular moderate or vigorous exercise each week helps you lose weight. This information is not intended to replace advice given to you by your health care provider. Make sure you discuss any questions you have with your health care provider. Document Revised: 12/22/2017 Document Reviewed: 12/22/2017 Elsevier Patient Education  2020 Elsevier Inc.   Living With Sleep Apnea Sleep apnea is a condition in which breathing pauses or becomes shallow during sleep. Sleep apnea is most commonly caused by a collapsed or blocked airway. People with sleep apnea snore loudly and have times when they gasp and stop breathing for 10 seconds or more during sleep. This happens over and over during the night. This disrupts your sleep and keeps your body from getting the rest that it needs, which can cause tiredness and lack of energy  (fatigue) during the day. The breaks in breathing also interrupt the deep sleep that you need to feel rested. Even if you do not completely wake up from the gaps in breathing, your sleep may not be restful. You may also have a headache in the morning and low energy during the day, and you may feel anxious or depressed. How can sleep apnea affect me? Sleep apnea increases your chances of extreme tiredness during the day (daytime fatigue). It can also increase your risk for health conditions, such as:  Heart attack.  Stroke.  Diabetes.  Heart failure.  Irregular heartbeat.  High blood pressure. If you have daytime fatigue as a result of sleep apnea, you may be more likely to:  Perform poorly at school or work.  Fall asleep while driving.  Have difficulty with attention.  Develop depression or anxiety.  Become severely overweight (obese).  Have sexual dysfunction. What actions can I take to manage sleep apnea? Sleep apnea treatment   If you were given a device to open your airway while you sleep, use it only as told by your health care provider. You may be given: ? An oral appliance. This is a custom-made mouthpiece that shifts your lower jaw forward. ? A continuous positive airway pressure (CPAP) device. This device blows air through a mask when  you breathe out (exhale). ? A nasal expiratory positive airway pressure (EPAP) device. This device has valves that you put into each nostril. ? A bi-level positive airway pressure (BPAP) device. This device blows air through a mask when you breathe in (inhale) and breathe out (exhale).  You may need surgery if other treatments do not work for you. Sleep habits  Go to sleep and wake up at the same time every day. This helps set your internal clock (circadian rhythm) for sleeping. ? If you stay up later than usual, such as on weekends, try to get up in the morning within 2 hours of your normal wake time.  Try to get at least 7-9 hours  of sleep each night.  Stop computer, tablet, and mobile phone use a few hours before bedtime.  Do not take long naps during the day. If you nap, limit it to 30 minutes.  Have a relaxing bedtime routine. Reading or listening to music may relax you and help you sleep.  Use your bedroom only for sleep. ? Keep your television and computer out of your bedroom. ? Keep your bedroom cool, dark, and quiet. ? Use a supportive mattress and pillows.  Follow your health care provider's instructions for other changes to sleep habits. Nutrition  Do not eat heavy meals in the evening.  Do not have caffeine in the later part of the day. The effects of caffeine can last for more than 5 hours.  Follow your health care provider's or dietitian's instructions for any diet changes. Lifestyle      Do not drink alcohol before bedtime. Alcohol can cause you to fall asleep at first, but then it can cause you to wake up in the middle of the night and have trouble getting back to sleep.  Do not use any products that contain nicotine or tobacco, such as cigarettes and e-cigarettes. If you need help quitting, ask your health care provider. Medicines  Take over-the-counter and prescription medicines only as told by your health care provider.  Do not use over-the-counter sleep medicine. You can become dependent on this medicine, and it can make sleep apnea worse.  Do not use medicines, such as sedatives and narcotics, unless told by your health care provider. Activity  Exercise on most days, but avoid exercising in the evening. Exercising near bedtime can interfere with sleeping.  If possible, spend time outside every day. Natural light helps regulate your circadian rhythm. General information  Lose weight if you need to, and maintain a healthy weight.  Keep all follow-up visits as told by your health care provider. This is important.  If you are having surgery, make sure to tell your health care  provider that you have sleep apnea. You may need to bring your device with you. Where to find more information Learn more about sleep apnea and daytime fatigue from:  American Sleep Association: sleepassociation.org  National Sleep Foundation: sleepfoundation.org  National Heart, Lung, and Blood Institute: BuffaloDryCleaner.gl Summary  Sleep apnea can cause daytime fatigue and other serious health conditions.  Both sleep apnea and daytime fatigue can be bad for your health and well-being.  You may need to wear a device while sleeping to help keep your airway open.  If you are having surgery, make sure to tell your health care provider that you have sleep apnea. You may need to bring your device with you.  Making changes to sleep habits, diet, lifestyle, and activity can help you manage sleep apnea. This information  is not intended to replace advice given to you by your health care provider. Make sure you discuss any questions you have with your health care provider. Document Revised: 04/02/2019 Document Reviewed: 03/05/2018 Elsevier Patient Education  [place].

## 2020-03-17 NOTE — Progress Notes (Signed)
Chief Complaint  Patient presents with  . Follow-up   F/u  1. HTN controlled on maxzide 37.5-25 mg qd 2. HLD on lipitor 20 mg qhs  3. Prediabetes last a1C 6.1 09/14/19 she has gained 14 lbs since last visit due to no physical activity  4. OSA on cpap needs new mask [facility] last sleep study >5 years ago agreeable to new sleep study Leb pulmonary mask is beneficial and she is compliant needs new nose piece for mask    Review of Systems  Constitutional: Negative for weight loss.  HENT: Negative for hearing loss.   Eyes: Negative for blurred vision.  Respiratory: Negative for shortness of breath.   Cardiovascular: Negative for chest pain.  Gastrointestinal: Negative for abdominal pain.  Musculoskeletal: Negative for falls.  Skin: Negative for rash.  Neurological: Negative for headaches.  Psychiatric/Behavioral: Negative for depression.   Past Medical History:  Diagnosis Date  . Anxiety    panic/anxiety 12/2010 celexa 20 is helping  . High cholesterol   . Hypertension   . Osteopenia    Past Surgical History:  Procedure Laterality Date  . ABDOMINAL HYSTERECTOMY  2010   partial both ovaries out  . COLONOSCOPY  2015   [facility]  . SALPINGOOPHORECTOMY Bilateral 07/24/2015   Procedure: ATTEMPTED LAPAROSCOPY, ABDOMINAL BILATERAL SALPINGO OOPHORECTOMY ;  Surgeon: Rubie Maid, MD;  Location: [facility] ORS;  Service: Gynecology;  Laterality: Bilateral;  . TONSILLECTOMY    . TUBAL LIGATION Bilateral 1988   Family History  Problem Relation Age of Onset  . Cancer Mother   . Breast cancer Mother 21       met to brain  . Alcohol abuse Father   . Heart failure Father   . Renal Disease Sister        on HD   Social History   Socioeconomic History  . Marital status: Divorced    Spouse name: Not on file  . Number of children: Not on file  . Years of education: Not on file  . Highest education level: Not on file  Occupational History  . Not on file  Tobacco Use  . Smoking  status: Former Smoker    Packs/day: 0.25    Years: 20.00    Pack years: 5.00    Quit date: 07/12/2008    Years since quitting: 11.6  . Smokeless tobacco: Never Used  Substance and Sexual Activity  . Alcohol use: Yes    Alcohol/week: 0.0 - 1.0 standard drinks  . Drug use: No  . Sexual activity: Not Currently    Birth control/protection: Surgical  Other Topics Concern  . 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 Difficulty of Paying Living Expenses:   Food Insecurity:   . Worried About Charity fundraiser in the Last Year:   . Arboriculturist in the Last Year:   Transportation Needs:   . Film/video editor (Medical):   Marland Kitchen Lack of Transportation (Non-Medical):   Physical Activity:   . Days of Exercise per Week:   . Minutes of Exercise per Session:   Stress:   . Feeling of Stress :   Social Connections:   . Frequency of Communication with Friends and Family:   . Frequency of Social Gatherings with Friends and Family:   . Attends Religious Services:   . Active Member of Clubs  or Organizations:   . Attends Archivist Meetings:   Marland Kitchen Marital Status:   Intimate Partner Violence:   . Fear of Current or Ex-Partner:   . Emotionally Abused:   Marland Kitchen Physically Abused:   . Sexually Abused:    Current Meds  Medication Sig  . atorvastatin (LIPITOR) 20 MG tablet TAKE 1 TABLET BY MOUTH AT  BEDTIME  . citalopram (CELEXA) 20 MG tablet Take 1 tablet (20 mg total) by mouth daily.  . hydrocortisone 2.5 % cream Apply topically 2 (two) times daily.  . meloxicam (MOBIC) 15 MG tablet Take 1 tablet (15 mg total) by mouth daily.  Marland Kitchen nystatin cream (MYCOSTATIN) Apply 1 application topically 2 (two) times daily.  Marland Kitchen triamterene-hydrochlorothiazide (MAXZIDE-25) 37.5-25 MG tablet Take 1 tablet by mouth daily.  . [DISCONTINUED] citalopram (CELEXA) 20 MG tablet Take 1 tablet (20 mg total) by mouth  daily.  . [DISCONTINUED] triamterene-hydrochlorothiazide (MAXZIDE-25) 37.5-25 MG tablet Take 1 tablet by mouth daily.   No Known Allergies Recent Results (from the past 2160 hour(s))  Novel Coronavirus, NAA ([facility])     Status: None   Collection Time: 12/21/19  1:27 PM   Specimen: Nasopharyngeal(NP) swabs in vial transport medium   NASOPHARYNGE  TESTING  Result Value Ref Range   SARS-CoV-2, NAA Not Detected Not Detected    Comment: This nucleic acid amplification test was developed and its performance characteristics determined by [employer]. Nucleic acid amplification tests include PCR and TMA. This test has not been FDA cleared or approved. This test has been authorized by FDA under an Emergency Use Authorization (EUA). This test is only authorized for the duration of time the declaration that circumstances exist justifying the authorization of the emergency use of in vitro diagnostic tests for detection of SARS-CoV-2 virus and/or diagnosis of COVID-19 infection under section 564(b)(1) of the Act, 21 U.S.C. 532DJM-4(Q) (1), unless the authorization is terminated or revoked sooner. When diagnostic testing is negative, the possibility of a false negative result should be considered in the context of a patient's recent exposures and the presence of clinical signs and symptoms consistent with COVID-19. An individual without symptoms of COVID-19 and who is not shedding SARS-CoV-2 virus would  expect to have a negative (not detected) result in this assay.   Novel Coronavirus, NAA ([facility])     Status: None   Collection Time: 01/07/20 12:08 PM   Specimen: Nasopharyngeal(NP) swabs in vial transport medium   NASOPHARYNGE  TESTING  Result Value Ref Range   SARS-CoV-2, NAA Not Detected Not Detected    Comment: This nucleic acid amplification test was developed and its performance characteristics determined by [employer]. Nucleic acid amplification tests include PCR  and TMA. This test has not been FDA cleared or approved. This test has been authorized by FDA under an Emergency Use Authorization (EUA). This test is only authorized for the duration of time the declaration that circumstances exist justifying the authorization of the emergency use of in vitro diagnostic tests for detection of SARS-CoV-2 virus and/or diagnosis of COVID-19 infection under section 564(b)(1) of the Act, 21 U.S.C. 683MHD-6(Q) (1), unless the authorization is terminated or revoked sooner. When diagnostic testing is negative, the possibility of a false negative result should be considered in the context of a patient's recent exposures and the presence of clinical signs and symptoms consistent with COVID-19. An individual without symptoms of COVID-19 and who is not shedding SARS-CoV-2 virus would  expect to have a negative (not detected) result in this  assay.    Objective  Body mass index is 52.29 kg/m. Wt Readings from Last 3 Encounters:  03/17/20 (!) 314 lb 3.2 oz (142.5 kg)  09/21/19 300 lb (136.1 kg)  09/14/19 (!) 311 lb 12.8 oz (141.4 kg)   Temp Readings from Last 3 Encounters:  03/17/20 (!) 97.2 F (36.2 C) (Temporal)  09/21/19 98.6 F (37 C) (Oral)  09/14/19 98 F (36.7 C) (Oral)   BP Readings from Last 3 Encounters:  03/17/20 126/78  09/21/19 (!) 149/71  09/14/19 128/72   Pulse Readings from Last 3 Encounters:  03/17/20 92  09/21/19 71  09/14/19 75    Physical Exam Vitals and nursing note reviewed.  Constitutional:      Appearance: Normal appearance. She is well-developed and well-groomed. She is morbidly obese.  HENT:     Head: Normocephalic and atraumatic.  Eyes:     Conjunctiva/sclera: Conjunctivae normal.     Pupils: Pupils are equal, round, and reactive to light.  Cardiovascular:     Rate and Rhythm: Normal rate and regular rhythm.     Heart sounds: Normal heart sounds. No murmur.  Pulmonary:     Effort: Pulmonary effort is normal.      Breath sounds: Normal breath sounds.  Skin:    General: Skin is warm and dry.  Neurological:     General: No focal deficit present.     Mental Status: She is alert and oriented to person, place, and time. Mental status is at baseline.     Gait: Gait normal.  Psychiatric:        Attention and Perception: Attention and perception normal.        Mood and Affect: Mood normal.        Speech: Speech normal.        Behavior: Behavior normal. Behavior is cooperative.        Thought Content: Thought content normal.        Cognition and Memory: Cognition and memory normal.        Judgment: Judgment normal.     Assessment  Plan  Essential hypertension - Plan: Comprehensive metabolic panel, Lipid panel, CBC w/Diff, triamterene-hydrochlorothiazide (MAXZIDE-25) 37.5-25 MG tablet  Prediabetes - Plan: Hemoglobin A1c Morbid obesity bmi >40  -rec healthy diet and exercise and portion control   Anxiety - Plan: citalopram (CELEXA) 20 MG tablet  OSA on CPAP  -needs new mask  Cpap is beneficial  [facility] 09/13/20 flu shot utd covid vaccine ? If will get tdap and shingrix utd  MMR immune   Hep C neg 09/02/17  Declines HIV  Pap neg 09/14/19 neg neg HPV mammo [place] referred sch 09/20/19 negative  colonoscopy had in [place] 08/2014 due in 2025   05/31/19 [facility] labs  A1C 6.2  Cr 0.75  TC 157, TG 74, HDL 51, LDL 91   Wt 313 lbs ht 65 inches Waist circum 52.5  BMI 52.1  BP 114/59 Glucose 102  COVID 19 negative  Provider: Dr. Olivia Mackie McLean-Scocuzza-Internal Medicine

## 2020-03-28 ENCOUNTER — Telehealth: Payer: Self-pay | Admitting: Internal Medicine

## 2020-03-28 NOTE — Telephone Encounter (Signed)
Faxed request for patient's sleep study results to [facility]. Signed release form then sent to scan.   Awaiting response

## 2020-04-03 NOTE — Telephone Encounter (Signed)
Spoke with Christine Michael and she will fax the pt's sleep study results. Confirmed fax number of [phone]

## 2020-04-04 ENCOUNTER — Telehealth: Payer: Self-pay | Admitting: Internal Medicine

## 2020-04-04 NOTE — Telephone Encounter (Signed)
According to 04/13 phone encounter. Dr French Ana has these results.

## 2020-04-04 NOTE — Telephone Encounter (Signed)
Last sleep study 07/28/13 needs to be repeated every 5 years  Is she agreeable to pulm consult for this to get set up and for her supplies?

## 2020-04-04 NOTE — Telephone Encounter (Signed)
Patient informed and verbalized understanding.    Pt is agreeable to referral to pulmonology, she has no preferences.

## 2020-04-07 ENCOUNTER — Other Ambulatory Visit: Payer: Self-pay | Admitting: Internal Medicine

## 2020-04-07 DIAGNOSIS — G4733 Obstructive sleep apnea (adult) (pediatric): Secondary | ICD-10-CM

## 2020-05-17 ENCOUNTER — Encounter: Payer: Self-pay | Admitting: Internal Medicine

## 2020-05-29 ENCOUNTER — Ambulatory Visit (INDEPENDENT_AMBULATORY_CARE_PROVIDER_SITE_OTHER): Payer: 59 | Admitting: Adult Health

## 2020-05-29 ENCOUNTER — Encounter: Payer: Self-pay | Admitting: Adult Health

## 2020-05-29 ENCOUNTER — Other Ambulatory Visit: Payer: Self-pay

## 2020-05-29 VITALS — BP 136/84 | HR 75 | Temp 98.0°F | Ht 64.0 in | Wt 317.0 lb

## 2020-05-29 DIAGNOSIS — Z9989 Dependence on other enabling machines and devices: Secondary | ICD-10-CM

## 2020-05-29 DIAGNOSIS — G4733 Obstructive sleep apnea (adult) (pediatric): Secondary | ICD-10-CM

## 2020-05-29 NOTE — Progress Notes (Signed)
@Patient  ID: , female    DOB: Jun 19, 1966, 54 y.o.   MRN: [id number]  Chief Complaint  Patient presents with  . Sleep Consult    OSA     Referring provider: McLean-Scocuzza, [id number] *  HPI: 54 year old female seen for sleep consult May 29, 2020 to establish for obstructive sleep apnea Medical history significant for tension, hyperlipidemia  TEST/EVENTS :   05/29/2020 Follow up : OSA  Patient presents today for a sleep consult to establish for obstructive sleep apnea.  Patient says she was diagnosed with sleep apnea in 2014.  She had a sleep study that showed some sleep apnea.  She was started on nocturnal CPAP and has been on that ever since.  She says she does well on CPAP she cannot go without it.  She wears it every single night gets in at least 7 hours of sleep each night.  She gets her supplies from [facility].  She says she does feel that she needs a new CPAP as her CPAP has been getting older.  Patient has no significant daytime sleepiness.  Says that since starting CPAP she feels so much better.  Prior CPAP she has significant daytime sleepiness snoring and restless sleep.  Patient uses a nasal mask.  Patient gets around 8 to 9 hours of sleep each night. Denies any cataplexy, sleep paralysis or narcolepsy symptoms. Was unable to get a CPAP download as her machine does not have a modem for download per her DME company  Social history patient is divorced.  Quit smoking in 2009.  Lives with her family members sister and a niece.  She works for 2010 in [employer]. Family history positive for breast cancer in her mother.   No Known Allergies  Immunization History  Administered Date(s) Administered  . Influenza,inj,Quad PF,6+ Mos 09/02/2017, 09/14/2019  . PFIZER SARS-COV-2 Vaccination 05/04/2020, 05/25/2020  . Tdap 09/02/2017  . Zoster Recombinat (Shingrix) 07/15/2018, 10/15/2018    Past Medical History:  Diagnosis Date  . Anxiety     panic/anxiety 12/2010 celexa 20 is helping  . High cholesterol   . Hypertension   . Osteopenia     Tobacco History: Social History   Tobacco Use  Smoking Status Former Smoker  . Packs/day: 0.25  . Years: 20.00  . Pack years: 5.00  . Quit date: 07/12/2008  . Years since quitting: 11.8  Smokeless Tobacco Never Used   Counseling given: Not Answered   Outpatient Medications Prior to Visit  Medication Sig Dispense Refill  . atorvastatin (LIPITOR) 20 MG tablet TAKE 1 TABLET BY MOUTH AT  BEDTIME 90 tablet 3  . citalopram (CELEXA) 20 MG tablet Take 1 tablet (20 mg total) by mouth daily. 90 tablet 3  . hydrocortisone 2.5 % cream Apply topically 2 (two) times daily. 60 g 12  . triamterene-hydrochlorothiazide (MAXZIDE-25) 37.5-25 MG tablet Take 1 tablet by mouth daily. 90 tablet 3  . meloxicam (MOBIC) 15 MG tablet Take 1 tablet (15 mg total) by mouth daily. 30 tablet 0  . nystatin cream (MYCOSTATIN) Apply 1 application topically 2 (two) times daily. 60 g 12   No facility-administered medications prior to visit.     Review of Systems:   Constitutional:   No  weight loss, night sweats,  Fevers, chills, fatigue, or  lassitude.  HEENT:   No headaches,  Difficulty swallowing,  Tooth/dental problems, or  Sore throat,  No sneezing, itching, ear ache, nasal congestion, post nasal drip,   CV:  No chest pain,  Orthopnea, PND, swelling in lower extremities, anasarca, dizziness, palpitations, syncope.   GI  No heartburn, indigestion, abdominal pain, nausea, vomiting, diarrhea, change in bowel habits, loss of appetite, bloody stools.   Resp: No shortness of breath with exertion or at rest.  No excess mucus, no productive cough,  No non-productive cough,  No coughing up of blood.  No change in color of mucus.  No wheezing.  No chest wall deformity  Skin: no rash or lesions.  GU: no dysuria, change in color of urine, no urgency or frequency.  No flank pain, no hematuria   MS:  No  joint pain or swelling.  No decreased range of motion.  No back pain.    Physical Exam  BP 136/84 (BP Location: Left Arm, Cuff Size: Large)   Pulse 75   Temp 98 F (36.7 C) (Temporal)   Ht 5\' 4"  (1.626 m)   Wt (!) 317 lb (143.8 kg)   LMP  (LMP Unknown)   SpO2 99% Comment: on RA  BMI 54.41 kg/m   GEN: A/Ox3; pleasant , NAD, BMI 54   HEENT:  [place]/AT,  NOSE-clear, THROAT-clear, no lesions, no postnasal drip or exudate noted.  Class III MP airway  NECK:  Supple w/ fair ROM; no JVD; normal carotid impulses w/o bruits; no thyromegaly or nodules palpated; no lymphadenopathy.    RESP  Clear  P & A; w/o, wheezes/ rales/ or rhonchi. no accessory muscle use, no dullness to percussion  CARD:  RRR, no m/r/g, no peripheral edema, pulses intact, no cyanosis or clubbing.  GI:   Soft & nt; nml bowel sounds; no organomegaly or masses detected.   Musco: Warm bil, no deformities or joint swelling noted.   Neuro: alert, no focal deficits noted.    Skin: Warm, no lesions or rashes    Lab Results:  CBC  BMET   BNP No results found for: BNP  ProBNP No results found for: PROBNP  Imaging: No results found.    No flowsheet data found.  No results found for: NITRICOXIDE      Assessment & Plan:   OSA on CPAP Excellent compliance and perceived benefit on nocturnal CPAP.  Patient needs a new CPAP machine.  She is continue on nocturnal CPAP. We will get CPAP download on new machine After 30 days of usage  Plan  Patient Instructions  Keep up great job on CPAP  Wear CPAP At bedtime each night  Work on healthy weight .  Do not drive if sleepy  CPAP Download.  Order for new CPAP .  Follow up with Dr. Mortimer Fries in 1 year and As needed       Morbid obesity ([place]) Healthy weight loss discussed     Rexene Edison, NP 05/29/2020

## 2020-05-29 NOTE — Patient Instructions (Signed)
Keep up great job on CPAP  Wear CPAP At bedtime each night  Work on healthy weight .  Do not drive if sleepy  CPAP Download.  Order for new CPAP .  Follow up with Dr. Belia Heman in 1 year and As needed

## 2020-05-29 NOTE — Assessment & Plan Note (Addendum)
Excellent compliance and perceived benefit on nocturnal CPAP.  Patient needs a new CPAP machine.  She is continue on nocturnal CPAP. We will get CPAP download on new machine After 30 days of usage  Plan  Patient Instructions  Keep up great job on CPAP  Wear CPAP At bedtime each night  Work on healthy weight .  Do not drive if sleepy  CPAP Download.  Order for new CPAP .  Follow up with Dr. Belia Heman in 1 year and As needed

## 2020-05-29 NOTE — Assessment & Plan Note (Signed)
Healthy weight loss discussed 

## 2020-05-30 NOTE — Progress Notes (Signed)
Reviewed and agree with assessment/plan.   Coralyn Helling, MD [facility] Pulmonary/Critical Care 05/30/2020, 8:26 AM Pager:  [phone]

## 2020-09-14 LAB — CBC WITH DIFFERENTIAL/PLATELET
Basophils Absolute: 0 10*3/uL (ref 0.0–0.2)
Basos: 1 %
EOS (ABSOLUTE): 0.1 10*3/uL (ref 0.0–0.4)
Eos: 2 %
Hematocrit: 39.9 % (ref 34.0–46.6)
Hemoglobin: 12.7 g/dL (ref 11.1–15.9)
Immature Grans (Abs): 0 10*3/uL (ref 0.0–0.1)
Immature Granulocytes: 0 %
Lymphocytes Absolute: 1.6 10*3/uL (ref 0.7–3.1)
Lymphs: 26 %
MCH: 25.8 pg — ABNORMAL LOW (ref 26.6–33.0)
MCHC: 31.8 g/dL (ref 31.5–35.7)
MCV: 81 fL (ref 79–97)
Monocytes Absolute: 0.6 10*3/uL (ref 0.1–0.9)
Monocytes: 9 %
Neutrophils Absolute: 3.9 10*3/uL (ref 1.4–7.0)
Neutrophils: 62 %
Platelets: 208 10*3/uL (ref 150–450)
RBC: 4.93 x10E6/uL (ref 3.77–5.28)
RDW: 14.2 % (ref 11.7–15.4)
WBC: 6.2 10*3/uL (ref 3.4–10.8)

## 2020-09-14 LAB — COMPREHENSIVE METABOLIC PANEL
ALT: 19 IU/L (ref 0–32)
AST: 19 IU/L (ref 0–40)
Albumin/Globulin Ratio: 1.7 (ref 1.2–2.2)
Albumin: 4.4 g/dL (ref 3.8–4.9)
Alkaline Phosphatase: 70 IU/L (ref 44–121)
BUN/Creatinine Ratio: 14 (ref 9–23)
BUN: 11 mg/dL (ref 6–24)
Bilirubin Total: 0.5 mg/dL (ref 0.0–1.2)
CO2: 26 mmol/L (ref 20–29)
Calcium: 9.5 mg/dL (ref 8.7–10.2)
Chloride: 101 mmol/L (ref 96–106)
Creatinine, Ser: 0.81 mg/dL (ref 0.57–1.00)
GFR calc Af Amer: 94 mL/min/{1.73_m2} (ref >59)
GFR calc non Af Amer: 81 mL/min/{1.73_m2} (ref >59)
Globulin, Total: 2.6 g/dL (ref 1.5–4.5)
Glucose: 96 mg/dL (ref 65–99)
Potassium: 3.6 mmol/L (ref 3.5–5.2)
Sodium: 141 mmol/L (ref 134–144)
Total Protein: 7 g/dL (ref 6.0–8.5)

## 2020-09-14 LAB — URINALYSIS, ROUTINE W REFLEX MICROSCOPIC
Bilirubin, UA: NEGATIVE
Glucose, UA: NEGATIVE
Ketones, UA: NEGATIVE
Leukocytes,UA: NEGATIVE
Nitrite, UA: NEGATIVE
Protein,UA: NEGATIVE
RBC, UA: NEGATIVE
Specific Gravity, UA: 1.023 (ref 1.005–1.030)
Urobilinogen, Ur: 0.2 mg/dL (ref 0.2–1.0)
pH, UA: 5.5 (ref 5.0–7.5)

## 2020-09-14 LAB — LIPID PANEL
Chol/HDL Ratio: 3.3 ratio (ref 0.0–4.4)
Cholesterol, Total: 164 mg/dL (ref 100–199)
HDL: 49 mg/dL (ref >39)
LDL Chol Calc (NIH): 100 mg/dL — ABNORMAL HIGH (ref 0–99)
Triglycerides: 78 mg/dL (ref 0–149)
VLDL Cholesterol Cal: 15 mg/dL (ref 5–40)

## 2020-09-14 LAB — HEMOGLOBIN A1C
Est. average glucose Bld gHb Est-mCnc: 131 mg/dL
Hgb A1c MFr Bld: 6.2 % — ABNORMAL HIGH (ref 4.8–5.6)

## 2020-09-14 LAB — TSH: TSH: 1.72 u[IU]/mL (ref 0.450–4.500)

## 2020-09-20 ENCOUNTER — Other Ambulatory Visit: Payer: Self-pay

## 2020-09-20 ENCOUNTER — Encounter: Payer: Self-pay | Admitting: Internal Medicine

## 2020-09-20 ENCOUNTER — Ambulatory Visit (INDEPENDENT_AMBULATORY_CARE_PROVIDER_SITE_OTHER): Payer: 59 | Admitting: Internal Medicine

## 2020-09-20 VITALS — BP 122/78 | HR 83 | Temp 98.4°F | Ht 65.87 in | Wt 310.8 lb

## 2020-09-20 DIAGNOSIS — Z23 Encounter for immunization: Secondary | ICD-10-CM

## 2020-09-20 DIAGNOSIS — B3731 Acute candidiasis of vulva and vagina: Secondary | ICD-10-CM

## 2020-09-20 DIAGNOSIS — L739 Follicular disorder, unspecified: Secondary | ICD-10-CM

## 2020-09-20 DIAGNOSIS — Z6841 Body Mass Index (BMI) 40.0 and over, adult: Secondary | ICD-10-CM

## 2020-09-20 DIAGNOSIS — Z0000 Encounter for general adult medical examination without abnormal findings: Secondary | ICD-10-CM | POA: Diagnosis not present

## 2020-09-20 DIAGNOSIS — R7303 Prediabetes: Secondary | ICD-10-CM | POA: Diagnosis not present

## 2020-09-20 DIAGNOSIS — B373 Candidiasis of vulva and vagina: Secondary | ICD-10-CM

## 2020-09-20 MED ORDER — FLUCONAZOLE 150 MG PO TABS: 150.0000 mg | ORAL_TABLET | Freq: Once | ORAL | 0 refills | Status: AC

## 2020-09-20 MED ORDER — DOXYCYCLINE HYCLATE 100 MG PO TABS: 100.0000 mg | ORAL_TABLET | Freq: Two times a day (BID) | ORAL | 0 refills | Status: DC

## 2020-09-20 MED ORDER — MUPIROCIN 2 % EX OINT: 1.0000 "application " | TOPICAL_OINTMENT | Freq: Two times a day (BID) | CUTANEOUS | 0 refills | Status: DC

## 2020-09-20 NOTE — Progress Notes (Signed)
Chief Complaint  Patient presents with  . Annual Exam  . Immunizations    flu shot    Annual doing well 1. Reviewed labs 08/2020 prediabetes LDL increasing rec healthy diet and exercise  2. C/o cyst right axilla x 1-2 weeks tried antibacterial soap, A&D oint and improved some but not going away  Review of Systems  Constitutional: Negative for weight loss.  HENT: Negative for hearing loss.   Eyes: Negative for blurred vision.  Cardiovascular: Negative for chest pain.  Gastrointestinal: Negative for abdominal pain.  Musculoskeletal: Negative for falls.  Skin: Negative for rash.  Neurological: Negative for headaches.  Psychiatric/Behavioral: Negative for depression.   Past Medical History:  Diagnosis Date  . Anxiety    panic/anxiety 12/2010 celexa 20 is helping  . High cholesterol   . Hypertension   . Osteopenia    Past Surgical History:  Procedure Laterality Date  . ABDOMINAL HYSTERECTOMY  2010   partial both ovaries out  . COLONOSCOPY  2015   [facility]  . SALPINGOOPHORECTOMY Bilateral 07/24/2015   Procedure: ATTEMPTED LAPAROSCOPY, ABDOMINAL BILATERAL SALPINGO OOPHORECTOMY ;  Surgeon: Rubie Maid, MD;  Location: [facility] ORS;  Service: Gynecology;  Laterality: Bilateral;  . TONSILLECTOMY    . TUBAL LIGATION Bilateral 1988   Family History  Problem Relation Age of Onset  . Cancer Mother   . Breast cancer Mother 53       met to brain  . Alcohol abuse Father   . Heart failure Father   . Renal Disease Sister        on HD   Social History   Socioeconomic History  . Marital status: Divorced    Spouse name: Not on file  . Number of children: Not on file  . Years of education: Not on file  . Highest education level: Not on file  Occupational History  . Not on file  Tobacco Use  . Smoking status: Former Smoker    Packs/day: 0.25    Years: 20.00    Pack years: 5.00    Quit date: 07/12/2008    Years since quitting: 12.2  . Smokeless tobacco: Never Used  Vaping Use   . Vaping Use: Never used  Substance and Sexual Activity  . Alcohol use: Yes    Alcohol/week: 0.0 - 1.0 standard drinks  . Drug use: No  . Sexual activity: Not Currently    Birth control/protection: Surgical  Other Topics Concern  . 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 Difficulty of Paying Living Expenses: Not on file  Food Insecurity:   . Worried About Charity fundraiser in the Last Year: Not on file  . Ran Out of Food in the Last Year: Not on file  Transportation Needs:   . Lack of Transportation (Medical): Not on file  . Lack of Transportation (Non-Medical): Not on file  Physical Activity:   . Days of Exercise per Week: Not on file  . Minutes of Exercise per Session: Not on file  Stress:   . Feeling of Stress : Not on file  Social Connections:   . Frequency of Communication with Friends and Family: Not on file  . Frequency of Social Gatherings with Friends and Family: Not on file  . Attends Religious Services: Not on file  . Active Member of Clubs or Organizations: Not on file  .  Attends Archivist Meetings: Not on file  . Marital Status: Not on file  Intimate Partner Violence:   . Fear of Current or Ex-Partner: Not on file  . Emotionally Abused: Not on file  . Physically Abused: Not on file  . Sexually Abused: Not on file   Current Meds  Medication Sig  . atorvastatin (LIPITOR) 20 MG tablet TAKE 1 TABLET BY MOUTH AT  BEDTIME  . citalopram (CELEXA) 20 MG tablet Take 1 tablet (20 mg total) by mouth daily.  . hydrocortisone 2.5 % cream Apply topically 2 (two) times daily.  . Multiple Vitamins-Minerals (WOMENS 50+ MULTI VITAMIN/MIN PO) Take by mouth.  . triamterene-hydrochlorothiazide (MAXZIDE-25) 37.5-25 MG tablet Take 1 tablet by mouth daily.   No Known Allergies Recent Results (from the past 2160 hour(s))  Comprehensive metabolic panel     Status: None    Collection Time: 09/13/20  8:42 AM  Result Value Ref Range   Glucose 96 65 - 99 mg/dL   BUN 11 6 - 24 mg/dL   Creatinine, Ser 0.81 0.57 - 1.00 mg/dL   GFR calc non Af Amer 81 >59 mL/min/1.73   GFR calc Af Amer 94 >59 mL/min/1.73    Comment: **[facility] currently reports eGFR in compliance with the current**   recommendations of the [employer]. [facility] will   update reporting as new guidelines are published from the NKF-ASN   Task force.    BUN/Creatinine Ratio 14 9 - 23   Sodium 141 134 - 144 mmol/L   Potassium 3.6 3.5 - 5.2 mmol/L   Chloride 101 96 - 106 mmol/L   CO2 26 20 - 29 mmol/L   Calcium 9.5 8.7 - 10.2 mg/dL   Total Protein 7.0 6.0 - 8.5 g/dL   Albumin 4.4 3.8 - 4.9 g/dL   Globulin, Total 2.6 1.5 - 4.5 g/dL   Albumin/Globulin Ratio 1.7 1.2 - 2.2   Bilirubin Total 0.5 0.0 - 1.2 mg/dL   Alkaline Phosphatase 70 44 - 121 IU/L    Comment:               **Please note reference interval change**   AST 19 0 - 40 IU/L   ALT 19 0 - 32 IU/L  Lipid panel     Status: Abnormal   Collection Time: 09/13/20  8:42 AM  Result Value Ref Range   Cholesterol, Total 164 100 - 199 mg/dL   Triglycerides 78 0 - 149 mg/dL   HDL 49 >39 mg/dL   VLDL Cholesterol Cal 15 5 - 40 mg/dL   LDL Chol Calc (NIH) 100 (H) 0 - 99 mg/dL   Chol/HDL Ratio 3.3 0.0 - 4.4 ratio    Comment:                                   T. Chol/HDL Ratio                                             Men  Women                               1/2 Avg.Risk  3.4    3.3  Avg.Risk  5.0    4.4                                2X Avg.Risk  9.6    7.1                                3X Avg.Risk 23.4   11.0   CBC w/Diff     Status: Abnormal   Collection Time: 09/13/20  8:42 AM  Result Value Ref Range   WBC 6.2 3.4 - 10.8 x10E3/uL   RBC 4.93 3.77 - 5.28 x10E6/uL   Hemoglobin 12.7 11.1 - 15.9 g/dL   Hematocrit 39.9 34.0 - 46.6 %   MCV 81 79 - 97 fL   MCH 25.8 (L) 26.6 - 33.0 pg   MCHC 31.8  31 - 35 g/dL   RDW 14.2 11.7 - 15.4 %   Platelets 208 150 - 450 x10E3/uL   Neutrophils 62 Not Estab. %   Lymphs 26 Not Estab. %   Monocytes 9 Not Estab. %   Eos 2 Not Estab. %   Basos 1 Not Estab. %   Neutrophils Absolute 3.9 1 - 7 x10E3/uL   Lymphocytes Absolute 1.6 0 - 3 x10E3/uL   Monocytes Absolute 0.6 0 - 0 x10E3/uL   EOS (ABSOLUTE) 0.1 0.0 - 0.4 x10E3/uL   Basophils Absolute 0.0 0 - 0 x10E3/uL   Immature Granulocytes 0 Not Estab. %   Immature Grans (Abs) 0.0 0.0 - 0.1 x10E3/uL  TSH     Status: None   Collection Time: 09/13/20  8:42 AM  Result Value Ref Range   TSH 1.720 0.450 - 4.500 uIU/mL  Urinalysis, Routine w reflex microscopic     Status: Abnormal   Collection Time: 09/13/20  8:42 AM  Result Value Ref Range   Specific Gravity, UA 1.023 1.005 - 1.030   pH, UA 5.5 5.0 - 7.5   Color, UA Yellow Yellow   Appearance Ur Cloudy (A) Clear   Leukocytes,UA Negative Negative   Protein,UA Negative Negative/Trace   Glucose, UA Negative Negative   Ketones, UA Negative Negative   RBC, UA Negative Negative   Bilirubin, UA Negative Negative   Urobilinogen, Ur 0.2 0.2 - 1.0 mg/dL   Nitrite, UA Negative Negative   Microscopic Examination Comment     Comment: Microscopic not indicated and not performed.  Hemoglobin A1c     Status: Abnormal   Collection Time: 09/13/20  8:42 AM  Result Value Ref Range   Hgb A1c MFr Bld 6.2 (H) 4.8 - 5.6 %    Comment:          Prediabetes: 5.7 - 6.4          Diabetes: >6.4          Glycemic control for adults with diabetes: <7.0    Est. average glucose Bld gHb Est-mCnc 131 mg/dL   Objective  Body mass index is 50.37 kg/m. Wt Readings from Last 3 Encounters:  09/20/20 (!) 310 lb 12.8 oz (141 kg)  05/29/20 (!) 317 lb (143.8 kg)  03/17/20 (!) 314 lb 3.2 oz (142.5 kg)   Temp Readings from Last 3 Encounters:  09/20/20 98.4 F (36.9 C) (Oral)  05/29/20 98 F (36.7 C) (Temporal)  03/17/20 (!) 97.2 F (36.2 C) (Temporal)   BP Readings from  Last 3 Encounters:  09/20/20 122/78  05/29/20 136/84  03/17/20 126/78   Pulse Readings from Last 3 Encounters:  09/20/20 83  05/29/20 75  03/17/20 92    Physical Exam Vitals reviewed.  Constitutional:      Appearance: Normal appearance. She is well-developed and well-groomed. She is morbidly obese.  HENT:     Head: Normocephalic and atraumatic.  Eyes:     Conjunctiva/sclera: Conjunctivae normal.     Pupils: Pupils are equal, round, and reactive to light.  Cardiovascular:     Rate and Rhythm: Normal rate and regular rhythm.     Heart sounds: Normal heart sounds. No murmur heard.   Pulmonary:     Effort: Pulmonary effort is normal.     Breath sounds: Normal breath sounds.  Skin:    General: Skin is warm and dry.  Neurological:     General: No focal deficit present.     Mental Status: She is alert and oriented to person, place, and time. Mental status is at baseline.     Gait: Gait normal.  Psychiatric:        Attention and Perception: Attention and perception normal.        Mood and Affect: Mood and affect normal.        Speech: Speech normal.        Behavior: Behavior normal. Behavior is cooperative.        Thought Content: Thought content normal.        Cognition and Memory: Cognition normal.        Judgment: Judgment normal.     Assessment  Plan  Annual physical exam Labs [facility] 09/13/20 flu shotutd given today  covid vaccine 2/2 booster due 12/2020, 01/2021 tdap and shingrix utd  MMR immune   Hep C neg 09/02/17  Declines HIV  Pap neg 09/14/19 neg neg HPV mammo [place] [facility] 09/20/19 negative will call today  colonoscopy had in [place] 08/2014 due in 2025  rec healthy diet and exercise   Prediabetes  Morbid obesity with BMI of 50.0-59.9, adult (HCC)  Folliculitis of right axilla - Plan: doxycycline (VIBRA-TABS) 100 MG tablet, mupirocin ointment (BACTROBAN) 2 %, fluconazole (DIFLUCAN) 150 MG tablet  Yeast vaginitis - Plan: fluconazole (DIFLUCAN)  150 MG tablet   05/31/19 [facility] labs  A1C 6.2  Cr 0.75  TC 157, TG 74, HDL 51, LDL 91   Wt 313 lbs ht 65 inches Waist circum 52.5  BMI 52.1  BP 114/59 Glucose 102  COVID 19 negative  Provider: Dr. Olivia Mackie McLean-Scocuzza-[facility]

## 2020-09-20 NOTE — Patient Instructions (Signed)
Call to schedule your mammogram Take doxycycline 2x per day with food x 1 week  Diflucan for yeast infection as needed   Exercising to Lose Weight Exercise is structured, repetitive physical activity to improve fitness and health. Getting regular exercise is important for everyone. It is especially important if you are overweight. Being overweight increases your risk of heart disease, stroke, diabetes, high blood pressure, and several types of cancer. Reducing your calorie intake and exercising can help you lose weight. Exercise is usually categorized as moderate or vigorous intensity. To lose weight, most people need to do a certain amount of moderate-intensity or vigorous-intensity exercise each week. Moderate-intensity exercise  Moderate-intensity exercise is any activity that gets you moving enough to burn at least three times more energy (calories) than if you were sitting. Examples of moderate exercise include:  Walking a mile in 15 minutes.  Doing light yard work.  Biking at an easy pace. Most people should get at least 150 minutes (2 hours and 30 minutes) a week of moderate-intensity exercise to maintain their body weight. Vigorous-intensity exercise Vigorous-intensity exercise is any activity that gets you moving enough to burn at least six times more calories than if you were sitting. When you exercise at this intensity, you should be working hard enough that you are not able to carry on a conversation. Examples of vigorous exercise include:  Running.  Playing a team sport, such as football, basketball, and soccer.  Jumping rope. Most people should get at least 75 minutes (1 hour and 15 minutes) a week of vigorous-intensity exercise to maintain their body weight. How can exercise affect me? When you exercise enough to burn more calories than you eat, you lose weight. Exercise also reduces body fat and builds muscle. The more muscle you have, the more calories you burn. Exercise  also:  Improves mood.  Reduces stress and tension.  Improves your overall fitness, flexibility, and endurance.  Increases bone strength. The amount of exercise you need to lose weight depends on:  Your age.  The type of exercise.  Any health conditions you have.  Your overall physical ability. Talk to your health care provider about how much exercise you need and what types of activities are safe for you. What actions can I take to lose weight? Nutrition   Make changes to your diet as told by your health care provider or diet and nutrition specialist (dietitian). This may include: ? Eating fewer calories. ? Eating more protein. ? Eating less unhealthy fats. ? Eating a diet that includes fresh fruits and vegetables, whole grains, low-fat dairy products, and lean protein. ? Avoiding foods with added fat, salt, and sugar.  Drink plenty of water while you exercise to prevent dehydration or heat stroke. Activity  Choose an activity that you enjoy and set realistic goals. Your health care provider can help you make an exercise plan that works for you.  Exercise at a moderate or vigorous intensity most days of the week. ? The intensity of exercise may vary from person to person. You can tell how intense a workout is for you by paying attention to your breathing and heartbeat. Most people will notice their breathing and heartbeat get faster with more intense exercise.  Do resistance training twice each week, such as: ? Push-ups. ? Sit-ups. ? Lifting weights. ? Using resistance bands.  Getting short amounts of exercise can be just as helpful as long structured periods of exercise. If you have trouble finding time to exercise, try  to include exercise in your daily routine. ? Get up, stretch, and walk around every 30 minutes throughout the day. ? Go for a walk during your lunch break. ? Park your car farther away from your destination. ? If you take public transportation, get off  one stop early and walk the rest of the way. ? Make phone calls while standing up and walking around. ? Take the stairs instead of elevators or escalators.  Wear comfortable clothes and shoes with good support.  Do not exercise so much that you hurt yourself, feel dizzy, or get very short of breath. Where to find more information  U.S. Department of Health and Human Services: ThisPath.fi  Centers for Disease Control and Prevention (CDC): FootballExhibition.com.br Contact a health care provider:  Before starting a new exercise program.  If you have questions or concerns about your weight.  If you have a medical problem that keeps you from exercising. Get help right away if you have any of the following while exercising:  Injury.  Dizziness.  Difficulty breathing or shortness of breath that does not go away when you stop exercising.  Chest pain.  Rapid heartbeat. Summary  Being overweight increases your risk of heart disease, stroke, diabetes, high blood pressure, and several types of cancer.  Losing weight happens when you burn more calories than you eat.  Reducing the amount of calories you eat in addition to getting regular moderate or vigorous exercise each week helps you lose weight. This information is not intended to replace advice given to you by your health care provider. Make sure you discuss any questions you have with your health care provider. Document Revised: 12/22/2017 Document Reviewed: 12/22/2017 Elsevier Patient Education  2020 [employer].

## 2020-10-17 ENCOUNTER — Ambulatory Visit
Admission: RE | Admit: 2020-10-17 | Discharge: 2020-10-17 | Disposition: A | Discharge status: Home / Self Care | Payer: 59 | Source: Ambulatory Visit | Attending: Internal Medicine | Admitting: Internal Medicine

## 2020-10-17 ENCOUNTER — Other Ambulatory Visit: Payer: Self-pay

## 2020-10-17 DIAGNOSIS — Z1231 Encounter for screening mammogram for malignant neoplasm of breast: Secondary | ICD-10-CM | POA: Insufficient documentation

## 2021-01-31 ENCOUNTER — Other Ambulatory Visit: Payer: Self-pay | Admitting: Internal Medicine

## 2021-01-31 DIAGNOSIS — E7800 Pure hypercholesterolemia, unspecified: Secondary | ICD-10-CM

## 2021-03-21 ENCOUNTER — Ambulatory Visit (INDEPENDENT_AMBULATORY_CARE_PROVIDER_SITE_OTHER): Payer: 59 | Admitting: Internal Medicine

## 2021-03-21 ENCOUNTER — Ambulatory Visit (INDEPENDENT_AMBULATORY_CARE_PROVIDER_SITE_OTHER): Payer: 59

## 2021-03-21 ENCOUNTER — Encounter: Payer: Self-pay | Admitting: Internal Medicine

## 2021-03-21 ENCOUNTER — Other Ambulatory Visit: Payer: Self-pay

## 2021-03-21 VITALS — BP 134/76 | HR 91 | Temp 97.5°F | Ht 65.87 in | Wt 307.2 lb

## 2021-03-21 DIAGNOSIS — M7989 Other specified soft tissue disorders: Secondary | ICD-10-CM

## 2021-03-21 DIAGNOSIS — M79604 Pain in right leg: Secondary | ICD-10-CM

## 2021-03-21 DIAGNOSIS — F419 Anxiety disorder, unspecified: Secondary | ICD-10-CM

## 2021-03-21 DIAGNOSIS — S8011XA Contusion of right lower leg, initial encounter: Secondary | ICD-10-CM | POA: Diagnosis not present

## 2021-03-21 DIAGNOSIS — I10 Essential (primary) hypertension: Secondary | ICD-10-CM | POA: Diagnosis not present

## 2021-03-21 MED ORDER — CITALOPRAM HYDROBROMIDE 20 MG PO TABS: 20.0000 mg | ORAL_TABLET | Freq: Every day | ORAL | 3 refills | Status: DC

## 2021-03-21 MED ORDER — TRIAMTERENE-HCTZ 37.5-25 MG PO TABS: 1.0000 | ORAL_TABLET | Freq: Every day | ORAL | 3 refills | Status: DC

## 2021-03-21 NOTE — Progress Notes (Signed)
Chief Complaint  Patient presents with  . Follow-up  . Leg Pain   Acute visit  3/26 someone fell and their knee cap hit her on her right lower leg and she had increased swelling and redness right lower leg which swelling was enlarged larger than her knee cap and now swelling has reduced but there is some swelling but overall has gone down and pain 5/10 today none has tried ice and immediately used a frozen veggie pack Denies knee pain or calf pain   BP elevated on maxzide 37.5-25 mg qd and repeat 134/76   Review of Systems  Constitutional: Negative for weight loss.  HENT: Negative for hearing loss.   Eyes: Negative for blurred vision.  Respiratory: Negative for shortness of breath.   Cardiovascular: Negative for chest pain.  Musculoskeletal: Positive for joint pain.  Skin: Negative for rash.  Neurological: Negative for headaches.  Psychiatric/Behavioral: Negative for depression.   Past Medical History:  Diagnosis Date  . Anxiety    panic/anxiety 12/2010 celexa 20 is helping  . High cholesterol   . Hypertension   . Osteopenia    Past Surgical History:  Procedure Laterality Date  . ABDOMINAL HYSTERECTOMY  2010   partial both ovaries out  . COLONOSCOPY  2015   [facility]  . SALPINGOOPHORECTOMY Bilateral 07/24/2015   Procedure: ATTEMPTED LAPAROSCOPY, ABDOMINAL BILATERAL SALPINGO OOPHORECTOMY ;  Surgeon: Rubie Maid, MD;  Location: [facility] ORS;  Service: Gynecology;  Laterality: Bilateral;  . TONSILLECTOMY    . TUBAL LIGATION Bilateral 1988   Family History  Problem Relation Age of Onset  . Cancer Mother   . Breast cancer Mother 63       met to brain  . Alcohol abuse Father   . Heart failure Father   . Renal Disease Sister        on HD   Social History   Socioeconomic History  . Marital status: Divorced    Spouse name: Not on file  . Number of children: Not on file  . Years of education: Not on file  . Highest education level: Not on file  Occupational History  .  Not on file  Tobacco Use  . Smoking status: Former Smoker    Packs/day: 0.25    Years: 20.00    Pack years: 5.00    Quit date: 07/12/2008    Years since quitting: 12.6  . Smokeless tobacco: Never Used  Vaping Use  . Vaping Use: Never used  Substance and Sexual Activity  . Alcohol use: Yes    Alcohol/week: 0.0 - 1.0 standard drinks  . Drug use: No  . Sexual activity: Not Currently    Birth control/protection: Surgical  Other Topics Concern  . 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 atorvastatin (LIPITOR) 20 MG tablet TAKE 1 TABLET BY MOUTH AT  BEDTIME  . hydrocortisone 2.5 % cream Apply topically 2 (two) times daily.  . Multiple Vitamins-Minerals (WOMENS 50+ MULTI VITAMIN/MIN PO) Take by mouth.  . mupirocin ointment (BACTROBAN) 2 % Apply 1 application topically 2 (two) times daily.  . [DISCONTINUED] citalopram (  CELEXA) 20 MG tablet Take 1 tablet (20 mg total) by mouth daily.  . [DISCONTINUED] triamterene-hydrochlorothiazide (MAXZIDE-25) 37.5-25 MG tablet Take 1 tablet by mouth daily.   No Known Allergies No results found for this or any previous visit (from the past 2160 hour(s)). Objective  Body mass index is 49.78 kg/m. Wt Readings from Last 3 Encounters:  03/21/21 (!) 307 lb 3.2 oz (139.3 kg)  09/20/20 (!) 310 lb 12.8 oz (141 kg)  05/29/20 (!) 317 lb (143.8 kg)   Temp Readings from Last 3 Encounters:  03/21/21 (!) 97.5 F (36.4 C) (Oral)  09/20/20 98.4 F (36.9 C) (Oral)  05/29/20 98 F (36.7 C) (Temporal)   BP Readings from Last 3 Encounters:  03/21/21 134/76  09/20/20 122/78  05/29/20 136/84   Pulse Readings from Last 3  Encounters:  03/21/21 91  09/20/20 83  05/29/20 75    Physical Exam Vitals reviewed.  Constitutional:      Appearance: Normal appearance. She is well-developed and well-groomed. She is morbidly obese.  HENT:     Head: Normocephalic and atraumatic.  Cardiovascular:     Rate and Rhythm: Normal rate and regular rhythm.     Heart sounds: Normal heart sounds. No murmur heard.   Pulmonary:     Effort: Pulmonary effort is normal.     Breath sounds: Normal breath sounds.  Skin:    General: Skin is warm and moist.       Neurological:     General: No focal deficit present.     Mental Status: She is alert and oriented to person, place, and time. Mental status is at baseline.     Gait: Gait normal.  Psychiatric:        Attention and Perception: Attention and perception normal.        Mood and Affect: Mood and affect normal.        Speech: Speech normal.        Behavior: Behavior normal. Behavior is cooperative.        Thought Content: Thought content normal.        Cognition and Memory: Cognition and memory normal.        Judgment: Judgment normal.     Assessment  Plan  Right leg pain/swelling ? 5-6 cm hematoma/seroma - Plan: DG Tibia/Fibula Right, [place] RT LOWER EXTREM LTD SOFT TISSUE NON VASCULAR Consider ortho if [place] abnormal  voltaren gel  Tylenol Heat and ice  Essential hypertension - Plan: triamterene-hydrochlorothiazide (MAXZIDE-25) 37.5-25 MG tablet  Anxiety - Plan: citalopram (CELEXA) 20 MG tablet   [facility] Labs [facility]9/22/21 flu shotutd given today  covid vaccine 2/2 booster due 12/2020, 01/2021 not had 3rd 03/21/21 tdap and shingrix utd  MMR immune   Hep C neg 09/02/17  Declines HIV  Pap neg9/22/20 neg neg HPV mammo [place] 10/07/20 negative  colonoscopy had in [place] 08/2014 due in 2025  rec healthy diet and exercise   Prediabetes  Provider: Dr. Olivia Mackie McLean-Scocuzza-Internal Medicine    CMA Yves Dill note   Patient was sitting with legs stretched out.  Someone fell and their knee connected with her right leg below the knee. Patient has swelling, bruising, and pain when the area is touched. Rated pain 5/10.

## 2021-03-21 NOTE — Patient Instructions (Addendum)
voltaren gel  Tylenol Heat and ice   Hematoma A hematoma is a collection of blood under the skin, in an organ, in a body space, in a joint space, or in other tissue. The blood can thicken (clot) to form a lump that you can see and feel. The lump is often firm and may become sore and tender. Most hematomas get better in a few days to weeks. However, some hematomas may be serious and require medical care. Hematomas can range from very small to very large. What are the causes? This condition is caused by:  A blunt or penetrating injury.  A leakage from a blood vessel under the skin.  Some medical procedures, including surgeries, such as oral surgery, face lifts, and surgeries on the joints.  Some medical conditions that cause bleeding or bruising. There may be multiple hematomas that appear in different areas of the body. What increases the risk? You are more likely to develop this condition if:  You are an older adult.  You use blood thinners. What are the signs or symptoms? Symptoms of this condition depend on where the hematoma is located.  Common symptoms of a hematoma that is under the skin include:  A firm lump on the body.  Pain and tenderness in the area.  Bruising. Blue, dark blue, purple-red, or yellowish skin (discoloration) may appear at the site of the hematoma if the hematoma is close to the surface of the skin. Common symptoms of a hematoma that is deep in the tissues or body spaces may be less obvious. They include:  A collection of blood in the stomach (intra-abdominal hematoma). This may cause pain in the abdomen, weakness, fainting, and shortness of breath.  A collection of blood in the head (intracranial hematoma). This may cause a headache or symptoms such as weakness, trouble speaking or understanding, or a change in consciousness.   How is this diagnosed? This condition is diagnosed based on:  Your medical history.  A physical exam.  Imaging tests, such  as an ultrasound or CT scan. These may be needed if your health care provider suspects a hematoma in deeper tissues or body spaces.  Blood tests. These may be needed if your health care provider believes that the hematoma is caused by a medical condition. How is this treated? Treatment for this condition depends on the cause, size, and location of the hematoma. Treatment may include:  Doing nothing. The majority of hematomas do not need treatment as many of them go away on their own over time.  Surgery or close monitoring. This may be needed for large hematomas or hematomas that affect vital organs.  Medicines. Medicines may be given if there is an underlying medical cause for the hematoma. Follow these instructions at home: Managing pain, stiffness, and swelling  If directed, put ice on the affected area. ? Put ice in a plastic bag. ? Place a towel between your skin and the bag. ? Leave the ice on for 20 minutes, 2-3 times a day for the first couple of days.  If directed, apply heat to the affected area after applying ice for a couple of days. Use the heat source that your health care provider recommends, such as a moist heat pack or a heating pad. ? Place a towel between your skin and the heat source. ? Leave the heat on for 20-30 minutes. ? Remove the heat if your skin turns bright red. This is especially important if you are unable to feel  pain, heat, or cold. You may have a greater risk of getting burned.  Raise (elevate) the affected area above the level of your heart while you are sitting or lying down.  If told, wrap the affected area with an elastic bandage. The bandage applies pressure (compression) to the area, which may help to reduce swelling and promote healing. Do not wrap the bandage too tightly around the affected area.  If your hematoma is on a leg or foot (lower extremity) and is painful, your health care provider may recommend crutches. Use them as told by your health  care provider.   General instructions  Take over-the-counter and prescription medicines only as told by your health care provider.  Keep all follow-up visits as told by your health care provider. This is important. Contact a health care provider if:  You have a fever.  The swelling or discoloration gets worse.  You develop more hematomas. Get help right away if:  Your pain is worse or your pain is not controlled with medicine.  Your skin over the hematoma breaks or starts bleeding.  Your hematoma is in your chest or abdomen and you have weakness, shortness of breath, or a change in consciousness.  You have a hematoma on your scalp that is caused by a fall or injury, and you also have: ? A headache that gets worse. ? Trouble speaking or understanding speech. ? Weakness. ? Change in alertness or consciousness. Summary  A hematoma is a collection of blood under the skin, in an organ, in a body space, in a joint space, or in other tissue.  This condition usually does not need treatment because many hematomas go away on their own over time.  Large hematomas, or those that may affect vital organs, may need surgical drainage or monitoring. If the hematoma is caused by a medical condition, medicines may be prescribed.  Get help right away if your hematoma breaks or starts to bleed, you have shortness of breath, or you have a headache or trouble speaking after a fall. This information is not intended to replace advice given to you by your health care provider. Make sure you discuss any questions you have with your health care provider. Document Revised: 05/05/2019 Document Reviewed: 05/14/2018 Elsevier Patient Education  2021 [employer].

## 2021-03-22 ENCOUNTER — Other Ambulatory Visit: Payer: Self-pay

## 2021-03-22 ENCOUNTER — Ambulatory Visit
Admission: RE | Admit: 2021-03-22 | Discharge: 2021-03-22 | Disposition: A | Discharge status: Home / Self Care | Payer: 59 | Source: Ambulatory Visit | Attending: Internal Medicine | Admitting: Internal Medicine

## 2021-03-22 DIAGNOSIS — M79604 Pain in right leg: Secondary | ICD-10-CM | POA: Diagnosis present

## 2021-03-22 DIAGNOSIS — S8011XA Contusion of right lower leg, initial encounter: Secondary | ICD-10-CM | POA: Diagnosis present

## 2021-03-22 DIAGNOSIS — M7989 Other specified soft tissue disorders: Secondary | ICD-10-CM

## 2021-04-10 ENCOUNTER — Encounter: Payer: Self-pay | Admitting: Internal Medicine

## 2021-04-11 NOTE — Telephone Encounter (Signed)
Please advise 

## 2021-04-12 ENCOUNTER — Other Ambulatory Visit: Payer: Self-pay | Admitting: Internal Medicine

## 2021-04-12 DIAGNOSIS — I10 Essential (primary) hypertension: Secondary | ICD-10-CM

## 2021-04-12 MED ORDER — AMLODIPINE BESYLATE 2.5 MG PO TABS: 2.5000 mg | ORAL_TABLET | Freq: Every day | ORAL | 2 refills | Status: DC

## 2021-04-18 ENCOUNTER — Encounter: Payer: Self-pay | Admitting: Internal Medicine

## 2021-04-18 ENCOUNTER — Telehealth: Payer: Self-pay | Admitting: Internal Medicine

## 2021-04-18 NOTE — Telephone Encounter (Signed)
PT called to inform that they tested positive for covid.

## 2021-04-18 NOTE — Telephone Encounter (Signed)
Patient currently taking OTC medication, Nyquil and tylenol helping. States fever is going down.  Patient's sister testing positive. Patient used a home test and tested positive.   Fever, body aches, congestion in the chest. Patient states that using Vicks vapor rub broke up chest congestion.   For your information

## 2021-04-18 NOTE — Telephone Encounter (Signed)
Schedule virtual for Thursday

## 2021-04-20 NOTE — Telephone Encounter (Signed)
Patient states she is getting better and doing okay with OTC.  Will call back if she needs an appointment

## 2021-04-23 ENCOUNTER — Telehealth (INDEPENDENT_AMBULATORY_CARE_PROVIDER_SITE_OTHER): Payer: 59 | Admitting: Adult Health

## 2021-04-23 ENCOUNTER — Encounter: Payer: Self-pay | Admitting: Adult Health

## 2021-04-23 VITALS — BP 127/87 | Temp 99.1°F | Ht 65.87 in | Wt 291.2 lb

## 2021-04-23 DIAGNOSIS — U071 COVID-19: Secondary | ICD-10-CM | POA: Diagnosis not present

## 2021-04-23 DIAGNOSIS — R0989 Other specified symptoms and signs involving the circulatory and respiratory systems: Secondary | ICD-10-CM | POA: Insufficient documentation

## 2021-04-23 DIAGNOSIS — R52 Pain, unspecified: Secondary | ICD-10-CM | POA: Insufficient documentation

## 2021-04-23 MED ORDER — PREDNISONE 10 MG (21) PO TBPK: ORAL_TABLET | ORAL | 0 refills | Status: DC

## 2021-04-23 NOTE — Telephone Encounter (Signed)
Spoke with Patient and scheduled for a virtual at 8:30 this morning with Christine Michael

## 2021-04-23 NOTE — Patient Instructions (Signed)

## 2021-04-23 NOTE — Progress Notes (Signed)
Virtual Visit via Video Note  I connected with Christine Michael on 04/23/21 at  8:30 AM EDT by a video enabled telemedicine application and verified that I am speaking with the correct person using two identifiers. Parties involved in visit as below:    Location: Patient: at home  Provider:Provider: Provider's office at  [facility], [place].      I discussed the limitations of evaluation and management by telemedicine and the availability of in person appointments. The patient expressed understanding and agreed to proceed.  History of Present Illness:  Patient with a home test kit tested positive for covid last week. She has been taking over the counter Nyquil. She reports she started having shooting pains and aches in the back of her legs. Hips and thighs are aching. No erythema,no swelling. Denies any injury. Fatigue. Onset was.5 weeks ago. Denies any fever. Sputum is clear and denies any chest tightness or pain. Able to do her normal activities. Patients sister is sick with Covid as well.  Coughing and chest congestion is improving, nasal congestion. Clear sputum.  She is drinking plenty of water and orange juice.   Denies dizziness, lightheadedness, pre syncopal or syncopal episodes. '  Patient  denies any fever,,chills, rash, chest pain, shortness of breath, nausea, vomiting, or diarrhea.      Observations/Objective:  Physical Exam Constitutional:      General: She is not in acute distress.    Appearance: She is obese. She is not ill-appearing, toxic-appearing or diaphoretic.  HENT:     Head: Normocephalic and atraumatic.  Musculoskeletal:     Cervical back: Normal range of motion.  Neurological:     General: No focal deficit present.     Mental Status: She is alert.  Psychiatric:        Mood and Affect: Mood normal.        Behavior: Behavior normal.        Thought Content: Thought content normal.        Judgment: Judgment normal.     Patient is alert and oriented and responsive to questions Engages in conversation with provider. Speaks in full sentences without any pauses without any shortness of breath or distress.  Assessment and Plan:  Body aches - Plan: predniSONE (STERAPRED UNI-PAK 21 TAB) 10 MG (21) TBPK tablet  Lab test positive for detection of COVID-19 virus  Chest congestion - Plan: predniSONE (STERAPRED UNI-PAK 21 TAB) 10 MG (21) TBPK tablet  Symptoms of chest congestion seem to be improving per patient she is experincing body aches, will send in prednisone.  Should help with chest congestion as well. She will call if any symptoms are woresning at anytime and in person evaluation is advised.  Recommend hydration with electrolyte solution and water for a few days.,  May also taker tylenol PRN per package instructions.  Follow Up Instructions:    I discussed the assessment and treatment plan with the patient. The patient was provided an opportunity to ask questions and all were answered. The patient agreed with the plan and demonstrated an understanding of the instructions.   The patient was advised to call back or seek an in-person evaluation if the symptoms worsen or if the condition fails to improve as anticipated.  I provided 25 minutes of non-face-to-face time during this encounter.   Marcille Buffy, FNP

## 2021-05-01 ENCOUNTER — Encounter: Payer: Self-pay | Admitting: Internal Medicine

## 2021-05-07 ENCOUNTER — Encounter: Payer: Self-pay | Admitting: Internal Medicine

## 2021-05-07 NOTE — Addendum Note (Signed)
Addended by: Quentin Ore on: 05/07/2021 02:40 AM   Modules accepted: Orders

## 2021-05-10 LAB — COMPREHENSIVE METABOLIC PANEL
ALT: 22 IU/L (ref 0–32)
AST: 19 IU/L (ref 0–40)
Albumin/Globulin Ratio: 1.8 (ref 1.2–2.2)
Albumin: 4.1 g/dL (ref 3.8–4.9)
Alkaline Phosphatase: 56 IU/L (ref 44–121)
BUN/Creatinine Ratio: 17 (ref 9–23)
BUN: 11 mg/dL (ref 6–24)
Bilirubin Total: 0.5 mg/dL (ref 0.0–1.2)
CO2: 25 mmol/L (ref 20–29)
Calcium: 9.3 mg/dL (ref 8.7–10.2)
Chloride: 100 mmol/L (ref 96–106)
Creatinine, Ser: 0.64 mg/dL (ref 0.57–1.00)
Globulin, Total: 2.3 g/dL (ref 1.5–4.5)
Glucose: 100 mg/dL — ABNORMAL HIGH (ref 65–99)
Potassium: 3.7 mmol/L (ref 3.5–5.2)
Sodium: 141 mmol/L (ref 134–144)
Total Protein: 6.4 g/dL (ref 6.0–8.5)
eGFR: 104 mL/min/{1.73_m2} (ref >59)

## 2021-05-10 LAB — CBC WITH DIFFERENTIAL/PLATELET
Basophils Absolute: 0 10*3/uL (ref 0.0–0.2)
Basos: 0 %
EOS (ABSOLUTE): 0.2 10*3/uL (ref 0.0–0.4)
Eos: 2 %
Hematocrit: 38 % (ref 34.0–46.6)
Hemoglobin: 12.4 g/dL (ref 11.1–15.9)
Immature Grans (Abs): 0 10*3/uL (ref 0.0–0.1)
Immature Granulocytes: 0 %
Lymphocytes Absolute: 1.5 10*3/uL (ref 0.7–3.1)
Lymphs: 24 %
MCH: 26.7 pg (ref 26.6–33.0)
MCHC: 32.6 g/dL (ref 31.5–35.7)
MCV: 82 fL (ref 79–97)
Monocytes Absolute: 0.7 10*3/uL (ref 0.1–0.9)
Monocytes: 11 %
Neutrophils Absolute: 3.9 10*3/uL (ref 1.4–7.0)
Neutrophils: 63 %
Platelets: 197 10*3/uL (ref 150–450)
RBC: 4.64 x10E6/uL (ref 3.77–5.28)
RDW: 14.5 % (ref 11.7–15.4)
WBC: 6.3 10*3/uL (ref 3.4–10.8)

## 2021-07-19 ENCOUNTER — Other Ambulatory Visit: Payer: Self-pay | Admitting: Internal Medicine

## 2021-07-19 DIAGNOSIS — I10 Essential (primary) hypertension: Secondary | ICD-10-CM

## 2021-07-19 MED ORDER — AMLODIPINE BESYLATE 2.5 MG PO TABS: 2.5000 mg | ORAL_TABLET | Freq: Every day | ORAL | 3 refills | Status: DC

## 2021-09-19 ENCOUNTER — Other Ambulatory Visit: Payer: Self-pay | Admitting: Internal Medicine

## 2021-09-19 DIAGNOSIS — Z1231 Encounter for screening mammogram for malignant neoplasm of breast: Secondary | ICD-10-CM

## 2021-09-20 ENCOUNTER — Encounter: Payer: 59 | Admitting: Internal Medicine

## 2021-10-18 ENCOUNTER — Ambulatory Visit
Admission: RE | Admit: 2021-10-18 | Discharge: 2021-10-18 | Disposition: A | Discharge status: Home / Self Care | Payer: 59 | Source: Ambulatory Visit | Attending: Internal Medicine | Admitting: Internal Medicine

## 2021-10-18 ENCOUNTER — Encounter: Payer: Self-pay | Admitting: Internal Medicine

## 2021-10-18 ENCOUNTER — Ambulatory Visit (INDEPENDENT_AMBULATORY_CARE_PROVIDER_SITE_OTHER): Payer: 59 | Admitting: Internal Medicine

## 2021-10-18 ENCOUNTER — Other Ambulatory Visit: Payer: Self-pay

## 2021-10-18 VITALS — BP 126/80 | HR 78 | Temp 98.3°F | Ht 66.46 in | Wt 289.6 lb

## 2021-10-18 DIAGNOSIS — I10 Essential (primary) hypertension: Secondary | ICD-10-CM

## 2021-10-18 DIAGNOSIS — I158 Other secondary hypertension: Secondary | ICD-10-CM

## 2021-10-18 DIAGNOSIS — E559 Vitamin D deficiency, unspecified: Secondary | ICD-10-CM

## 2021-10-18 DIAGNOSIS — Z23 Encounter for immunization: Secondary | ICD-10-CM | POA: Diagnosis not present

## 2021-10-18 DIAGNOSIS — Z1231 Encounter for screening mammogram for malignant neoplasm of breast: Secondary | ICD-10-CM | POA: Diagnosis not present

## 2021-10-18 DIAGNOSIS — Z0000 Encounter for general adult medical examination without abnormal findings: Secondary | ICD-10-CM | POA: Diagnosis not present

## 2021-10-18 DIAGNOSIS — K5900 Constipation, unspecified: Secondary | ICD-10-CM | POA: Diagnosis not present

## 2021-10-18 DIAGNOSIS — R7303 Prediabetes: Secondary | ICD-10-CM

## 2021-10-18 DIAGNOSIS — Z1329 Encounter for screening for other suspected endocrine disorder: Secondary | ICD-10-CM

## 2021-10-18 DIAGNOSIS — Z1389 Encounter for screening for other disorder: Secondary | ICD-10-CM

## 2021-10-18 NOTE — Progress Notes (Signed)
Chief Complaint  Patient presents with   Annual Exam   Annual  1. Htn controlled on norvasc 2.5 mg qd maxzide 37.5-25 mg qd  2. Mammo sch today  3. Constipation new will increase water intake and let me know    Review of Systems  Constitutional:  Positive for weight loss.  HENT:  Negative for hearing loss.   Eyes:  Negative for blurred vision.  Respiratory:  Negative for shortness of breath.   Cardiovascular:  Negative for chest pain.  Gastrointestinal:  Negative for abdominal pain.  Musculoskeletal:  Negative for falls and joint pain.  Skin:  Negative for rash.  Neurological:  Negative for headaches.  Psychiatric/Behavioral:  Negative for depression.   Past Medical History:  Diagnosis Date   Anxiety    panic/anxiety 12/2010 celexa 20 is helping   COVID-19    end 03/2021 early 04/2021   High cholesterol    Hypertension    Osteopenia    Past Surgical History:  Procedure Laterality Date   ABDOMINAL HYSTERECTOMY  2010   partial both ovaries out   COLONOSCOPY  2015   [facility]   SALPINGOOPHORECTOMY Bilateral 07/24/2015   Procedure: ATTEMPTED LAPAROSCOPY, ABDOMINAL BILATERAL SALPINGO OOPHORECTOMY ;  Surgeon: Rubie Maid, MD;  Location: [facility] ORS;  Service: Gynecology;  Laterality: Bilateral;   TONSILLECTOMY     TUBAL LIGATION Bilateral 1988   Family History  Problem Relation Age of Onset   Cancer Mother    Breast cancer Mother 72       met to brain   Alcohol abuse Father    Heart failure Father    Renal Disease Sister        on HD   Social History   Socioeconomic History   Marital status: Divorced    Spouse name: Not on file   Number of children: Not on file   Years of education: Not on file   Highest education level: Not on file  Occupational History   Not on file  Tobacco Use   Smoking status: Former    Packs/day: 0.25    Years: 20.00    Pack years: 5.00    Types: Cigarettes    Quit date: 07/12/2008    Years since quitting: 13.2   Smokeless tobacco:  Never  Vaping Use   Vaping Use: Never used  Substance and Sexual Activity   Alcohol use: Yes    Alcohol/week: 0.0 - 1.0 standard drinks   Drug use: No   Sexual activity: Not Currently    Birth control/protection: Surgical  Other Topics Concern   Not on file  Social History Narrative   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   atorvastatin (LIPITOR) 20 MG tablet TAKE 1 TABLET BY MOUTH AT  BEDTIME   citalopram (CELEXA) 20 MG tablet Take 1 tablet (20 mg total) by mouth daily.   Multiple Vitamins-Minerals (WOMENS 50+ MULTI VITAMIN/MIN PO) Take by mouth.   triamterene-hydrochlorothiazide (MAXZIDE-25) 37.5-25 MG tablet Take 1 tablet by mouth daily.   No  Known Allergies No results found for this or any previous visit (from the past 2160 hour(s)). Objective  Body mass index is 46.1 kg/m. Wt Readings from Last 3 Encounters:  10/18/21 289 lb 9.6 oz (131.4 kg)  04/23/21 291 lb 3.2 oz (132.1 kg)  03/21/21 (!) 307 lb 3.2 oz (139.3 kg)   Temp Readings from Last 3 Encounters:  10/18/21 98.3 F (36.8 C) (Oral)  04/23/21 99.1 F (37.3 C)  03/21/21 (!) 97.5 F (36.4 C) (Oral)   BP Readings from Last 3 Encounters:  10/18/21 126/80  04/23/21 127/87  03/21/21 134/76   Pulse Readings from Last 3 Encounters:  10/18/21 78  03/21/21 91  09/20/20 83    Physical Exam Vitals and nursing note reviewed.  Constitutional:      Appearance: Normal appearance. She is well-developed and well-groomed. She is obese.  HENT:     Head: Normocephalic and atraumatic.  Eyes:     Conjunctiva/sclera: Conjunctivae  normal.     Pupils: Pupils are equal, round, and reactive to light.  Cardiovascular:     Rate and Rhythm: Normal rate and regular rhythm.     Heart sounds: Normal heart sounds. No murmur heard. Pulmonary:     Effort: Pulmonary effort is normal.     Breath sounds: Normal breath sounds.  Chest:     Chest wall: No mass.  Breasts:    Breasts are symmetrical.     Right: Normal.     Left: Normal.  Abdominal:     General: Abdomen is flat. Bowel sounds are normal.  Lymphadenopathy:     Upper Body:     Right upper body: No axillary adenopathy.     Left upper body: No axillary adenopathy.  Skin:    General: Skin is warm and dry.  Neurological:     General: No focal deficit present.     Mental Status: She is alert and oriented to person, place, and time. Mental status is at baseline.     Gait: Gait normal.  Psychiatric:        Attention and Perception: Attention and perception normal.        Mood and Affect: Mood and affect normal.        Speech: Speech normal.        Behavior: Behavior normal. Behavior is cooperative.        Thought Content: Thought content normal.        Cognition and Memory: Cognition and memory normal.        Judgment: Judgment normal.    Assessment  Plan  Annual physical exam - Plan: Comprehensive metabolic panel, Lipid panel, CBC w/Diff, Hemoglobin A1c, TSH, Urinalysis, Routine w reflex microscopic, Vitamin D (25 hydroxy) Needs flu shot - Plan: Flu Vaccine QUAD 6+ mos PF IM (Fluarix Quad PF) Labs [facility] 10/18/21 flu shot utd given today  covid vaccine 2/2 booster due 12/2020, 01/2021 not had 3rd 03/21/21 as of 10/18/21 not sure if will get this  tdap and shingrix utd  MMR immune    Hep C neg 09/02/17  Declines HIV  Pap neg 09/14/19 neg neg HPV mammo [place] 10/07/20 negative sch today 10/18/21  colonoscopy had in [place] 08/2014 due in 2025  rec healthy diet and exercise    Constipation new Increase water and fiber prn miralax or senna-s Consider CT  ab/pelvis vs GI consult in the future  Hypertension, unspecified type - Plan: Comprehensive metabolic panel, Lipid panel, CBC w/Diff Controlled on norvasc 2.5 and maxzide 37.5-25  mg qd   Prediabetes - Plan: Hemoglobin A1c  Provider: Dr. Olivia Mackie McLean-Scocuzza-[facility]

## 2021-10-18 NOTE — Patient Instructions (Addendum)
Kiwis  Consider pfizer vaccine boosters rec total rec total 4 you've had 2   Think about prevnar  Pneumococcal Conjugate Vaccine (Prevnar 13) Suspension for Injection What is this medication? PNEUMOCOCCAL VACCINE (NEU mo KOK al vak SEEN) is a vaccine used to prevent pneumococcus bacterial infections. These bacteria can cause serious infections like pneumonia, meningitis, and blood infections. This vaccine will lower your chance of getting pneumonia. If you do get pneumonia, it can make your symptoms milder and your illness shorter. This vaccine will not treat an infection and will not cause infection. This vaccine is recommended for infants and young children, adults with certain medical conditions, and adults 65 years or older. This medicine may be used for other purposes; ask your health care provider or pharmacist if you have questions. COMMON BRAND NAME(S): Prevnar, Prevnar 13 What should I tell my care team before I take this medication? They need to know if you have any of these conditions: bleeding problems fever immune system problems an unusual or allergic reaction to pneumococcal vaccine, diphtheria toxoid, other vaccines, latex, other medicines, foods, dyes, or preservatives pregnant or trying to get pregnant breast-feeding How should I use this medication? This vaccine is for injection into a muscle. It is given by a health care professional. A copy of Vaccine Information Statements will be given before each vaccination. Read this sheet carefully each time. The sheet may change frequently. Talk to your pediatrician regarding the use of this medicine in children. While this drug may be prescribed for children as young as 4 weeks old for selected conditions, precautions do apply. Overdosage: If you think you have taken too much of this medicine contact a poison control center or emergency room at once. NOTE: This medicine is only for you. Do not share this medicine with others. What  if I miss a dose? It is important not to miss your dose. Call your doctor or health care professional if you are unable to keep an appointment. What may interact with this medication? medicines for cancer chemotherapy medicines that suppress your immune function steroid medicines like prednisone or cortisone This list may not describe all possible interactions. Give your health care provider a list of all the medicines, herbs, non-prescription drugs, or dietary supplements you use. Also tell them if you smoke, drink alcohol, or use illegal drugs. Some items may interact with your medicine. What should I watch for while using this medication? Mild fever and pain should go away in 3 days or less. Report any unusual symptoms to your doctor or health care professional. What side effects may I notice from receiving this medication? Side effects that you should report to your doctor or health care professional as soon as possible: allergic reactions like skin rash, itching or hives, swelling of the face, lips, or tongue breathing problems confused fast or irregular heartbeat fever over 102 degrees F seizures unusual bleeding or bruising unusual muscle weakness Side effects that usually do not require medical attention (report to your doctor or health care professional if they continue or are bothersome): aches and pains diarrhea fever of 102 degrees F or less headache irritable loss of appetite pain, tender at site where injected trouble sleeping This list may not describe all possible side effects. Call your doctor for medical advice about side effects. You may report side effects to FDA at 1-800-FDA-1088. Where should I keep my medication? This does not apply. This vaccine is given in a clinic, pharmacy, doctor's office, or other health care  setting and will not be stored at home. NOTE: This sheet is a summary. It may not cover all possible information. If you have questions about this  medicine, talk to your doctor, pharmacist, or health care provider.  2022 Elsevier/Gold Standard (2014-09-15 10:27:27)  Constipation, Adult Constipation is when a person has fewer than three bowel movements in a week, has difficulty having a bowel movement, or has stools (feces) that are dry, hard, or larger than normal. Constipation may be caused by an underlying condition. It may become worse with age if a person takes certain medicines and does not take in enough fluids. Follow these instructions at home: Eating and drinking  Eat foods that have a lot of fiber, such as beans, whole grains, and fresh fruits and vegetables. Limit foods that are low in fiber and high in fat and processed sugars, such as fried or sweet foods. These include french fries, hamburgers, cookies, candies, and soda. Drink enough fluid to keep your urine pale yellow. General instructions Exercise regularly or as told by your health care provider. Try to do 150 minutes of moderate exercise each week. Use the bathroom when you have the urge to go. Do not hold it in. Take over-the-counter and prescription medicines only as told by your health care provider. This includes any fiber supplements. During bowel movements: Practice deep breathing while relaxing the lower abdomen. Practice pelvic floor relaxation. Watch your condition for any changes. Let your health care provider know about them. Keep all follow-up visits as told by your health care provider. This is important. Contact a health care provider if: You have pain that gets worse. You have a fever. You do not have a bowel movement after 4 days. You vomit. You are not hungry or you lose weight. You are bleeding from the opening between the buttocks (anus). You have thin, pencil-like stools. Get help right away if: You have a fever and your symptoms suddenly get worse. You leak stool or have blood in your stool. Your abdomen is bloated. You have severe pain  in your abdomen. You feel dizzy or you faint. Summary Constipation is when a person has fewer than three bowel movements in a week, has difficulty having a bowel movement, or has stools (feces) that are dry, hard, or larger than normal. Eat foods that have a lot of fiber, such as beans, whole grains, and fresh fruits and vegetables. Drink enough fluid to keep your urine pale yellow. Take over-the-counter and prescription medicines only as told by your health care provider. This includes any fiber supplements. This information is not intended to replace advice given to you by your health care provider. Make sure you discuss any questions you have with your health care provider. Document Revised: 10/27/2019 Document Reviewed: 10/27/2019 Elsevier Patient Education  2022 [employer].

## 2021-10-19 LAB — COMPREHENSIVE METABOLIC PANEL
ALT: 18 IU/L (ref 0–32)
AST: 20 IU/L (ref 0–40)
Albumin/Globulin Ratio: 1.6 (ref 1.2–2.2)
Albumin: 4.2 g/dL (ref 3.8–4.9)
Alkaline Phosphatase: 64 IU/L (ref 44–121)
BUN/Creatinine Ratio: 14 (ref 9–23)
BUN: 10 mg/dL (ref 6–24)
Bilirubin Total: 0.3 mg/dL (ref 0.0–1.2)
CO2: 26 mmol/L (ref 20–29)
Calcium: 9.1 mg/dL (ref 8.7–10.2)
Chloride: 104 mmol/L (ref 96–106)
Creatinine, Ser: 0.7 mg/dL (ref 0.57–1.00)
Globulin, Total: 2.7 g/dL (ref 1.5–4.5)
Glucose: 92 mg/dL (ref 70–99)
Potassium: 3.7 mmol/L (ref 3.5–5.2)
Sodium: 144 mmol/L (ref 134–144)
Total Protein: 6.9 g/dL (ref 6.0–8.5)
eGFR: 101 mL/min/{1.73_m2} (ref >59)

## 2021-10-19 LAB — URINALYSIS, ROUTINE W REFLEX MICROSCOPIC
Bilirubin, UA: NEGATIVE
Glucose, UA: NEGATIVE
Ketones, UA: NEGATIVE
Leukocytes,UA: NEGATIVE
Nitrite, UA: NEGATIVE
RBC, UA: NEGATIVE
Specific Gravity, UA: >=1.03 — AB (ref 1.005–1.030)
Urobilinogen, Ur: 0.2 mg/dL (ref 0.2–1.0)
pH, UA: 5.5 (ref 5.0–7.5)

## 2021-10-19 LAB — CBC WITH DIFFERENTIAL/PLATELET
Basophils Absolute: 0 10*3/uL (ref 0.0–0.2)
Basos: 1 %
EOS (ABSOLUTE): 0.4 10*3/uL (ref 0.0–0.4)
Eos: 9 %
Hematocrit: 41.7 % (ref 34.0–46.6)
Hemoglobin: 13.3 g/dL (ref 11.1–15.9)
Immature Grans (Abs): 0 10*3/uL (ref 0.0–0.1)
Immature Granulocytes: 0 %
Lymphocytes Absolute: 1.3 10*3/uL (ref 0.7–3.1)
Lymphs: 28 %
MCH: 25.5 pg — ABNORMAL LOW (ref 26.6–33.0)
MCHC: 31.9 g/dL (ref 31.5–35.7)
MCV: 80 fL (ref 79–97)
Monocytes Absolute: 0.6 10*3/uL (ref 0.1–0.9)
Monocytes: 14 %
Neutrophils Absolute: 2.1 10*3/uL (ref 1.4–7.0)
Neutrophils: 48 %
Platelets: 174 10*3/uL (ref 150–450)
RBC: 5.22 x10E6/uL (ref 3.77–5.28)
RDW: 13.8 % (ref 11.7–15.4)
WBC: 4.5 10*3/uL (ref 3.4–10.8)

## 2021-10-19 LAB — LIPID PANEL
Chol/HDL Ratio: 3.5 ratio (ref 0.0–4.4)
Cholesterol, Total: 166 mg/dL (ref 100–199)
HDL: 47 mg/dL (ref >39)
LDL Chol Calc (NIH): 107 mg/dL — ABNORMAL HIGH (ref 0–99)
Triglycerides: 61 mg/dL (ref 0–149)
VLDL Cholesterol Cal: 12 mg/dL (ref 5–40)

## 2021-10-19 LAB — VITAMIN D 25 HYDROXY (VIT D DEFICIENCY, FRACTURES): Vit D, 25-Hydroxy: 39.8 ng/mL (ref 30.0–100.0)

## 2021-10-19 LAB — HEMOGLOBIN A1C
Est. average glucose Bld gHb Est-mCnc: 131 mg/dL
Hgb A1c MFr Bld: 6.2 % — ABNORMAL HIGH (ref 4.8–5.6)

## 2021-10-19 LAB — TSH: TSH: 1.15 u[IU]/mL (ref 0.450–4.500)

## 2021-10-22 ENCOUNTER — Other Ambulatory Visit: Payer: Self-pay | Admitting: Internal Medicine

## 2021-10-22 DIAGNOSIS — R928 Other abnormal and inconclusive findings on diagnostic imaging of breast: Secondary | ICD-10-CM

## 2021-10-22 DIAGNOSIS — N6320 Unspecified lump in the left breast, unspecified quadrant: Secondary | ICD-10-CM

## 2021-10-24 ENCOUNTER — Encounter: Payer: Self-pay | Admitting: Internal Medicine

## 2021-10-25 ENCOUNTER — Encounter: Payer: Self-pay | Admitting: Internal Medicine

## 2021-11-02 ENCOUNTER — Ambulatory Visit
Admission: RE | Admit: 2021-11-02 | Discharge: 2021-11-02 | Disposition: A | Discharge status: Home / Self Care | Payer: 59 | Source: Ambulatory Visit | Attending: Internal Medicine | Admitting: Internal Medicine

## 2021-11-02 ENCOUNTER — Other Ambulatory Visit: Payer: Self-pay

## 2021-11-02 DIAGNOSIS — N6320 Unspecified lump in the left breast, unspecified quadrant: Secondary | ICD-10-CM | POA: Diagnosis not present

## 2021-11-02 DIAGNOSIS — R928 Other abnormal and inconclusive findings on diagnostic imaging of breast: Secondary | ICD-10-CM

## 2021-12-16 ENCOUNTER — Encounter: Payer: Self-pay | Admitting: Internal Medicine

## 2021-12-18 ENCOUNTER — Ambulatory Visit (INDEPENDENT_AMBULATORY_CARE_PROVIDER_SITE_OTHER): Payer: 59 | Admitting: Adult Health

## 2021-12-18 ENCOUNTER — Other Ambulatory Visit: Payer: Self-pay

## 2021-12-18 ENCOUNTER — Encounter: Payer: Self-pay | Admitting: Adult Health

## 2021-12-18 VITALS — BP 132/84 | HR 78 | Temp 98.3°F | Resp 19 | Ht 66.46 in | Wt 283.8 lb

## 2021-12-18 DIAGNOSIS — J069 Acute upper respiratory infection, unspecified: Secondary | ICD-10-CM | POA: Diagnosis not present

## 2021-12-18 DIAGNOSIS — H60392 Other infective otitis externa, left ear: Secondary | ICD-10-CM | POA: Diagnosis not present

## 2021-12-18 DIAGNOSIS — H66002 Acute suppurative otitis media without spontaneous rupture of ear drum, left ear: Secondary | ICD-10-CM

## 2021-12-18 MED ORDER — AMOXICILLIN-POT CLAVULANATE 875-125 MG PO TABS
1.0000 | ORAL_TABLET | Freq: Two times a day (BID) | ORAL | 0 refills | Status: DC
Start: 2021-12-18 — End: 2022-01-28

## 2021-12-18 MED ORDER — NEOMYCIN-POLYMYXIN-HC 3.5-10000-1 OT SOLN: 3.0000 [drp] | Freq: Three times a day (TID) | OTIC | 0 refills | Status: DC

## 2021-12-18 NOTE — Progress Notes (Signed)
° °Acute Office Visit ° °Subjective:  ° ° Patient ID: Christine Michael, female    DOB: 09/16/1966, 55 y.o.   MRN: [id number] ° °Chief Complaint  °Patient presents with  ° Sinus Problem  °  Pt having pressure in her head and left ear since 12/22  ° ° °Sinus Problem °Associated symptoms include congestion, ear pain and sinus pressure. Pertinent negatives include no sore throat.  °Patient is in today for started  last Thursday, she started sudafed, pain in her left ear. Pain and pressure in left ear is worsening.facial pressure is improved some.  ° °Patient  denies any fever, body aches,chills, rash, chest pain, shortness of breath, nausea, vomiting, or diarrhea.  °Synthroid ° ° °Past Medical History:  °Diagnosis Date  ° Anxiety   ° panic/anxiety 12/2010 celexa 20 is helping  ° COVID-19   ° end 03/2021 early 04/2021  ° High cholesterol   ° Hypertension   ° Osteopenia   ° ° °Past Surgical History:  °Procedure Laterality Date  ° ABDOMINAL HYSTERECTOMY  2010  ° partial both ovaries out  ° COLONOSCOPY  2015  ° [facility]  ° SALPINGOOPHORECTOMY Bilateral 07/24/2015  ° Procedure: ATTEMPTED LAPAROSCOPY, ABDOMINAL BILATERAL SALPINGO OOPHORECTOMY ;  Surgeon: Anika Cherry, MD;  Location: [facility] OR[facility];  Service: Gynecology;  Laterality: Bilateral;  ° TONSILLECTOMY    ° TUBAL LIGATION Bilateral 1988  ° ° °Family History  °Problem Relation Age of Onset  ° Cancer Mother   ° Breast cancer Mother 73  °     met to brain  ° Alcohol abuse Father   ° Heart failure Father   ° Renal Disease Sister   °     on HD  ° ° °Social History  ° °Socioeconomic History  ° Marital status: Divorced  °  Spouse name: Not on file  ° Number of children: Not on file  ° Years of education: Not on file  ° Highest education level: Not on file  °Occupational History  ° Not on file  °Tobacco Use  ° Smoking status: Former  °  Packs/day: 0.25  °  Years: 20.00  °  Pack years: 5.00  °  Types: Cigarettes  °  Quit date: 07/12/2008  °  Years since quitting: 13.4  ° Smokeless  tobacco: Never  °Vaping Use  ° Vaping Use: Never used  °Substance and Sexual Activity  ° Alcohol use: Yes  °  Alcohol/week: 0.0 - 1.0 standard drinks  ° Drug use: No  ° Sexual activity: Not Currently  °  Birth control/protection: Surgical  °Other Topics Concern  ° Not on file  °Social History Narrative  ° 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 90 tablet 3  ° atorvastatin (LIPITOR) 20 MG tablet TAKE 1 TABLET BY MOUTH AT  BEDTIME 90 tablet 3  ° citalopram (CELEXA) 20 MG tablet Take 1 tablet (20 mg total) by mouth daily. 90 tablet 3  ° Multiple Vitamins-Minerals (WOMENS 50+ MULTI VITAMIN/MIN PO)   Take by mouth.    ° triamterene-hydrochlorothiazide (MAXZIDE-25) 37.5-25 MG tablet Take 1 tablet by mouth daily. 90 tablet 3  ° °No facility-administered medications prior to visit.  ° ° °No Known Allergies ° °Review of Systems  °Constitutional: Negative.   °HENT:  Positive for congestion, ear pain, postnasal drip, rhinorrhea and sinus pressure. Negative for ear discharge and sore throat.   °Respiratory: Negative.    °Cardiovascular: Negative.   °Gastrointestinal: Negative.   °Genitourinary: Negative.   °Musculoskeletal: Negative.   ° °   °Objective:  °  °Physical Exam °Vitals reviewed.  °Constitutional:   °   General: She is not in acute distress. °   Appearance: She is well-developed. She is not diaphoretic.  °   Interventions: She is not intubated. °HENT:  °   Head: Normocephalic and atraumatic.  °   Salivary Glands: Right salivary gland is not diffusely enlarged or tender. Left  salivary gland is not diffusely enlarged or tender.  °   Right Ear: External ear normal. No drainage, swelling or tenderness. A middle ear effusion is present. Tympanic membrane is not perforated, erythematous or retracted.  °   Left Ear: External ear normal. Swelling and tenderness present. No drainage. A middle ear effusion is present. Tympanic membrane is erythematous. Tympanic membrane is not perforated or retracted.  °   Nose: Nose normal.  °   Mouth/Throat:  °   Mouth: Mucous membranes are moist.  °   Pharynx: No oropharyngeal exudate or posterior oropharyngeal erythema.  °Eyes:  °   General: Lids are normal. No scleral icterus.    °   Right eye: No discharge.     °   Left eye: No discharge.  °   Conjunctiva/sclera: Conjunctivae normal.  °   Right eye: Right conjunctiva is not injected. No exudate or hemorrhage. °   Left eye: Left conjunctiva is not injected. No exudate or hemorrhage. °   Pupils: Pupils are equal, round, and reactive to light.  °Neck:  °   Thyroid: No thyroid mass or thyromegaly.  °   Vascular: Normal carotid pulses. No carotid bruit, hepatojugular reflux or JVD.  °   Trachea: Trachea and phonation normal. No tracheal tenderness or tracheal deviation.  °   Meningeal: Brudzinski's sign and Kernig's sign absent.  °Cardiovascular:  °   Rate and Rhythm: Normal rate and regular rhythm.  °   Pulses: Normal pulses.     °     Radial pulses are 2+ on the right side and 2+ on the left side.  °     Dorsalis pedis pulses are 2+ on the right side and 2+ on the left side.  °     Posterior tibial pulses are 2+ on the right side and 2+ on the left side.  °   Heart sounds: Normal heart sounds, S1 normal and S2 normal. Heart sounds not distant. No murmur heard. °  No friction rub. No gallop.  °Pulmonary:  °   Effort: Pulmonary effort is normal. No tachypnea, bradypnea, accessory muscle usage or respiratory distress. She is not intubated.  °   Breath sounds: Normal breath sounds. No stridor. No wheezing, rhonchi  or rales.  °Chest:  °   Chest wall: No tenderness.  °Abdominal:  °   General: Bowel sounds are normal. There is no distension or abdominal bruit.  °   Palpations: Abdomen is soft. There is no shifting dullness, fluid wave, hepatomegaly, splenomegaly, mass or pulsatile mass.  °     Tenderness: There is no abdominal tenderness. There is no guarding or rebound.     Hernia: No hernia is present.  Musculoskeletal:        General: No tenderness or deformity. Normal range of motion.     Cervical back: Full passive range of motion without pain, normal range of motion and neck supple. No edema, erythema or rigidity. No spinous process tenderness or muscular tenderness. Normal range of motion.  Lymphadenopathy:     Head:     Right side of head: No submental, submandibular, tonsillar, preauricular, posterior auricular or occipital adenopathy.     Left side of head: No submental, submandibular, tonsillar, preauricular, posterior auricular or occipital adenopathy.     Cervical: No cervical adenopathy.     Right cervical: No superficial, deep or posterior cervical adenopathy.    Left cervical: No superficial, deep or posterior cervical adenopathy.     Upper Body:     Right upper body: No supraclavicular or pectoral adenopathy.     Left upper body: No supraclavicular or pectoral adenopathy.  Skin:    General: Skin is warm and dry.     Coloration: Skin is not pale.     Findings: No abrasion, bruising, burn, ecchymosis, erythema, lesion, petechiae or rash.     Nails: There is no clubbing.  Neurological:     Mental Status: She is alert and oriented to person, place, and time.     GCS: GCS eye subscore is 4. GCS verbal subscore is 5. GCS motor subscore is 6.     Cranial Nerves: No cranial nerve deficit.     Sensory: No sensory deficit.     Motor: No tremor, atrophy, abnormal muscle tone or seizure activity.     Coordination: Coordination normal.     Gait: Gait normal.     Deep Tendon Reflexes: Reflexes are  normal and symmetric. Reflexes normal. Babinski sign absent on the right side. Babinski sign absent on the left side.     Reflex Scores:      Tricep reflexes are 2+ on the right side and 2+ on the left side.      Bicep reflexes are 2+ on the right side and 2+ on the left side.      Brachioradialis reflexes are 2+ on the right side and 2+ on the left side.      Patellar reflexes are 2+ on the right side and 2+ on the left side.      Achilles reflexes are 2+ on the right side and 2+ on the left side. Psychiatric:        Speech: Speech normal.        Behavior: Behavior normal.        Thought Content: Thought content normal.        Judgment: Judgment normal.    BP 132/84    Pulse 78    Temp 98.3 F (36.8 C)    Resp 19    Ht 5' 6.46" (1.688 m)    Wt 283 lb 12.8 oz (128.7 kg)    LMP  (LMP Unknown)    SpO2 97%    BMI 45.18 kg/m  Wt Readings from Last 3 Encounters:  12/18/21 283 lb 12.8 oz (128.7 kg)  10/18/21 289 lb 9.6 oz (131.4 kg)  04/23/21 291 lb 3.2 oz (132.1 kg)    Health Maintenance Due  Topic Date Due   Pneumococcal Vaccine 57-78 Years old (1 - PCV) Never done    There are no  preventive care reminders to display for this patient. ° ° °Lab Results  °Component Value Date  ° TSH 1.150 10/18/2021  ° °Lab Results  °Component Value Date  ° WBC 4.5 10/18/2021  ° HGB 13.3 10/18/2021  ° HCT 41.7 10/18/2021  ° MCV 80 10/18/2021  ° PLT 174 10/18/2021  ° °Lab Results  °Component Value Date  ° NA 144 10/18/2021  ° K 3.7 10/18/2021  ° CO2 26 10/18/2021  ° GLUCOSE 92 10/18/2021  ° BUN 10 10/18/2021  ° CREATININE 0.70 10/18/2021  ° BILITOT 0.3 10/18/2021  ° ALKPHOS 64 10/18/2021  ° AST 20 10/18/2021  ° ALT 18 10/18/2021  ° PROT 6.9 10/18/2021  ° ALBUMIN 4.2 10/18/2021  ° CALCIUM 9.1 10/18/2021  ° ANIONGAP 11 05/21/2015  ° EGFR 101 10/18/2021  ° °Lab Results  °Component Value Date  ° CHOL 166 10/18/2021  ° °Lab Results  °Component Value Date  ° HDL 47 10/18/2021  ° °Lab Results  °Component Value Date  °  LDLCALC 107 (H) 10/18/2021  ° °Lab Results  °Component Value Date  ° TRIG 61 10/18/2021  ° °Lab Results  °Component Value Date  ° CHOLHDL 3.5 10/18/2021  ° °Lab Results  °Component Value Date  ° HGBA1C 6.2 (H) 10/18/2021  ° ° °   °Assessment & Plan:  ° °Problem List Items Addressed This Visit   °None °Visit Diagnoses   ° ° Upper respiratory tract infection, unspecified type    -  Primary  ° Relevant Orders  ° COVID-19, Flu A+B and RSV  ° Non-recurrent acute suppurative otitis media of left ear without spontaneous rupture of tympanic membrane      ° Relevant Medications  ° amoxicillin-clavulanate (AUGMENTIN) 875-125 MG tablet  ° Other infective acute otitis externa of left ear      ° Relevant Medications  ° neomycin-polymyxin-hydrocortisone (CORTISPORIN) OTIC solution  ° °  ° °Orders Placed This Encounter  °Procedures  ° COVID-19, Flu A+B and RSV  °  Order Specific Question:   Previously tested for COVID-19  °  Answer:   Yes  °  Order Specific Question:   Resident in a congregate (group) care setting  °  Answer:   No  °  Order Specific Question:   Is the patient student?  °  Answer:   No  °  Order Specific Question:   Employed in healthcare setting  °  Answer:   No  °  Order Specific Question:   Pregnant  °  Answer:   No  °  Order Specific Question:   Has patient completed COVID vaccination(s) (2 doses of Pfizer/Moderna 1 dose of [employer])  °  Answer:   No  °  ° °Meds ordered this encounter  °Medications  ° amoxicillin-clavulanate (AUGMENTIN) 875-125 MG tablet  °  Sig: Take 1 tablet by mouth 2 (two) times daily.  °  Dispense:  20 tablet  °  Refill:  0  ° neomycin-polymyxin-hydrocortisone (CORTISPORIN) OTIC solution  °  Sig: Place 3 drops into the left ear 3 (three) times daily.  °  Dispense:  10 mL  °  Refill:  0  °Continue Flonase as ordered/ prescribed.  ° °Red Flags discussed. The patient was given clear instructions to go to ER or return to medical center if any red flags develop, symptoms do not  improve, worsen or new problems develop. They verbalized understanding. ° °Return if symptoms worsen or fail to improve, for at any time for   any worsening symptoms, Go to Emergency room/ urgent care if worse. ° ° °Michelle Smith Flinchum, FNP ° °

## 2021-12-18 NOTE — Patient Instructions (Signed)
Neomycin; Polymyxin B; Hydrocortisone Ear Solution What is this medication? NEOMYCIN; POLYMYXIN B; HYDROCORTISONE (nee oh MYE sin; pol i MIX in B; hye droe KOR ti sone) is used to treat ear infections. This medicine may be used for other purposes; ask your health care provider or pharmacist if you have questions. COMMON BRAND NAME(S): AK-Spore HC, AK-Spore HC Otic, Antibiotic Otic, Cortisporin, Cortomycin, Oti-Sone, Oticin HC, Otimar, Otocidin What should I tell my care team before I take this medication? They need to know if you have any of these conditions: any other active infections chronic ear infections or fluid in the ear perforated ear drum an unusual or allergic reaction to neomycin, polymyxin B, hydrocortisone, sulfites, other medicines, foods, dyes, or preservatives pregnant or trying to get pregnant breast-feeding How should I use this medication? This medicine is only for use in the ears. Follow the directions on the prescription label. Wash hands before and after use. Clean your ear of any fluid that can be easily removed. Do not insert any object or swab into the ear canal. Gently warm the bottle by holding it in the hand for 1 to 2 minutes. Lie down on your side with the infected ear facing upward. Try not to touch the tip of the dropper to your ear, fingertips, or other surface. Squeeze the bottle gently to put the prescribed number of drops in the ear canal. Stay in this position for 30 to 60 seconds to help the drops soak into the ear. Repeat the steps for the other ear if both ears are infected. Do not use your medicine more often than directed. Finish the full course of medicine prescribed by your doctor or health care professional even if you think your condition is better. Talk to your pediatrician regarding the use of this medicine in children. While this drug may be prescribed for selected conditions, precautions do apply. Overdosage: If you think you have taken too much of  this medicine contact a poison control center or emergency room at once. NOTE: This medicine is only for you. Do not share this medicine with others. What if I miss a dose? If you miss a dose, use it as soon as you can. If it is almost time for your next dose, use only that dose. Do not take double or extra doses. What may interact with this medication? Interactions are not expected. Do not use other ear products without talking to your doctor or health care professional. This list may not describe all possible interactions. Give your health care provider a list of all the medicines, herbs, non-prescription drugs, or dietary supplements you use. Also tell them if you smoke, drink alcohol, or use illegal drugs. Some items may interact with your medicine. What should I watch for while using this medication? Tell your doctor or health care professional if your ear infection does not get better in a few days. Do not use longer than 10 days unless instructed by your doctor or health care professional. If rash or allergic reaction occurs, stop the product immediately and contact your physician. It is important that you keep the infected ear(s) clean and dry. When bathing, try not to get the infected ear(s) wet. Do not go swimming unless your doctor or health care professional has told you otherwise. To prevent the spread of infection, do not share ear products, or share towels and washcloths with anyone else. What side effects may I notice from receiving this medication? Side effects that you should report to  your doctor or health care professional as soon as possible: rash red, itchy, dry scaly skin at the affected site worsening ear pain Side effects that usually do not require medical attention (report to your doctor or health care professional if they continue or are bothersome): abnormal sensation in the ear burning or stinging while putting the drops in the ear This list may not describe all  possible side effects. Call your doctor for medical advice about side effects. You may report side effects to FDA at 1-800-FDA-1088. Where should I keep my medication? Keep out of the reach of children. Store at room temperature between 15 and 25 degrees C (59 and 77 degrees F). Do not freeze. Throw away any unused medicine after the expiration date. NOTE: This sheet is a summary. It may not cover all possible information. If you have questions about this medicine, talk to your doctor, pharmacist, or health care provider.  2022 Elsevier/Gold Standard (2021-08-28 00:00:00) Amoxicillin; Clavulanic Acid Tablets What is this medication? AMOXICILLIN; CLAVULANIC ACID (a mox i SIL in; KLAV yoo lan ic AS id) treats infections caused by bacteria. It belongs to a group of medications called penicillin antibiotics. It will not treat colds, the flu, or infections caused by viruses. This medicine may be used for other purposes; ask your health care provider or pharmacist if you have questions. COMMON BRAND NAME(S): Augmentin What should I tell my care team before I take this medication? They need to know if you have any of these conditions: Kidney disease Liver disease Mononucleosis Stomach or intestine problems such as colitis An unusual or allergic reaction to amoxicillin, other penicillin or cephalosporin antibiotics, clavulanic acid, other medications, foods, dyes, or preservatives Pregnant or trying to get pregnant Breast-feeding How should I use this medication? Take this medication by mouth. Take it as directed on the prescription label at the same time every day. Take it with food at the start of a meal or snack. Take all of this medication unless your care team tells you to stop it early. Keep taking it even if you think you are better. Talk to your care team about the use of this medication in children. While it may be prescribed for selected conditions, precautions do apply. Overdosage: If you  think you have taken too much of this medicine contact a poison control center or emergency room at once. NOTE: This medicine is only for you. Do not share this medicine with others. What if I miss a dose? If you miss a dose, take it as soon as you can. If it is almost time for your next dose, take only that dose. Do not take double or extra doses. What may interact with this medication? Allopurinol Anticoagulants Birth control pills Methotrexate Probenecid This list may not describe all possible interactions. Give your health care provider a list of all the medicines, herbs, non-prescription drugs, or dietary supplements you use. Also tell them if you smoke, drink alcohol, or use illegal drugs. Some items may interact with your medicine. What should I watch for while using this medication? Tell your care team if your symptoms do not start to get better or if they get worse. This medication may cause serious skin reactions. They can happen weeks to months after starting the medication. Contact your care team right away if you notice fevers or flu-like symptoms with a rash. The rash may be red or purple and then turn into blisters or peeling of the skin. Or, you might notice a  red rash with swelling of the face, lips or lymph nodes in your neck or under your arms. Do not treat diarrhea with over the counter products. Contact your care team if you have diarrhea that lasts more than 2 days or if it is severe and watery. If you have diabetes, you may get a false-positive result for sugar in your urine. Check with your care team. Birth control may not work properly while you are taking this medication. Talk to your care team about using an extra method of birth control. What side effects may I notice from receiving this medication? Side effects that you should report to your care team as soon as possible: Allergic reactions--skin rash, itching, hives, swelling of the face, lips, tongue, or  throat Liver injury--right upper belly pain, loss of appetite, nausea, light-colored stool, dark yellow or brown urine, yellowing skin or eyes, unusual weakness or fatigue Redness, blistering, peeling, or loosening of the skin, including inside the mouth Severe diarrhea, fever Unusual vaginal discharge, itching, or odor Side effects that usually do not require medical attention (report to your care team if they continue or are bothersome): Diarrhea Nausea Vomiting This list may not describe all possible side effects. Call your doctor for medical advice about side effects. You may report side effects to FDA at 1-800-FDA-1088. Where should I keep my medication? Keep out of the reach of children and pets. Store at room temperature between 20 and 25 degrees C (68 and 77 degrees F). Throw away any unused medication after the expiration date. NOTE: This sheet is a summary. It may not cover all possible information. If you have questions about this medicine, talk to your doctor, pharmacist, or health care provider.  2022 Elsevier/Gold Standard (2020-12-03 00:00:00) Otitis Externa Otitis externa is an infection of the outer ear canal. The outer ear canal is the area between the outside of the ear and the eardrum. Otitis externa is sometimes called swimmer's ear. What are the causes? Common causes of this condition include: Swimming in dirty water. Moisture in the ear. An injury to the inside of the ear. An object stuck in the ear. A cut or scrape on the outside of the ear or in the ear canal. What increases the risk? You are more likely to get this condition if you go swimming often. What are the signs or symptoms? Itching in the ear. This is often the first symptom. Swelling of the ear. Redness in the ear. Ear pain. The pain may get worse when you pull on your ear. Pus coming from the ear. How is this treated? This condition may be treated with: Antibiotic ear drops. These are often given  for 10-14 days. Medicines to reduce itching and swelling. Follow these instructions at home: If you were prescribed antibiotic ear drops, use them as told by your doctor. Do not stop using them even if you start to feel better. Take over-the-counter and prescription medicines only as told by your doctor. Avoid getting water in your ears as told by your doctor. You may be told to avoid swimming or water sports for a few days. Keep all follow-up visits. How is this prevented? Keep your ears dry. Use the corner of a towel to dry your ears after you swim or bathe. Try not to scratch or put things in your ear. Doing these things makes it easier for germs to grow in your ear. Avoid swimming in lakes, dirty water, or swimming pools that may not have the right  amount of a chemical called chlorine. Contact a doctor if: You have a fever. Your ear is still red, swollen, or painful after 3 days. You still have pus coming from your ear after 3 days. Your redness, swelling, or pain gets worse. You have a very bad headache. Get help right away if: You have redness, swelling, and pain or tenderness behind your ear. Summary Otitis externa is an infection of the outer ear canal. Symptoms include pain, redness, and swelling of the ear. If you were prescribed antibiotic ear drops, use them as told by your doctor. Do not stop using them even if you start to feel better. Try not to scratch or put things in your ear. This information is not intended to replace advice given to you by your health care provider. Make sure you discuss any questions you have with your health care provider. Document Revised: 02/21/2021 Document Reviewed: 02/21/2021 Elsevier Patient Education  [place]. Otitis Media, Adult Otitis media is a condition in which the middle ear is red and swollen (inflamed) and full of fluid. The middle ear is the part of the ear that contains bones for hearing as well as air that helps send  sounds to the brain. The condition usually goes away on its own. What are the causes? This condition is caused by a blockage in the eustachian tube. This tube connects the middle ear to the back of the nose. It normally allows air into the middle ear. The blockage is caused by fluid or swelling. Problems that can cause blockage include: A cold or infection that affects the nose, mouth, or throat. Allergies. An irritant, such as tobacco smoke. Adenoids that have become large. The adenoids are soft tissue located in the back of the throat, behind the nose and the roof of the mouth. Growth or swelling in the upper part of the throat, just behind the nose (nasopharynx). Damage to the ear caused by a change in pressure. This is called barotrauma. What increases the risk? You are more likely to develop this condition if you: Smoke or are exposed to tobacco smoke. Have an opening in the roof of your mouth (cleft palate). Have acid reflux. Have problems in your body's defense system (immune system). What are the signs or symptoms? Symptoms of this condition include: Ear pain. Fever. Problems with hearing. Being tired. Fluid leaking from the ear. Ringing in the ear. How is this treated? This condition can go away on its own within 3-5 days. But if the condition is caused by germs (bacteria) and does not go away on its own, or if it keeps coming back, your doctor may: Give you antibiotic medicines. Give you medicines for pain. Follow these instructions at home: Take over-the-counter and prescription medicines only as told by your doctor. If you were prescribed an antibiotic medicine, take it as told by your doctor. Do not stop taking it even if you start to feel better. Keep all follow-up visits. Contact a doctor if: You have bleeding from your nose. There is a lump on your neck. You are not feeling better in 5 days. You feel worse instead of better. Get help right away if: You have pain  that is not helped with medicine. You have swelling, redness, or pain around your ear. You get a stiff neck. You cannot move part of your face (paralysis). You notice that the bone behind your ear hurts when you touch it. You get a very bad headache. Summary Otitis media means  that the middle ear is red, swollen, and full of fluid. This condition usually goes away on its own. If the problem does not go away, treatment may be needed. You may be given medicines to treat the infection or to treat your pain. If you were prescribed an antibiotic medicine, take it as told by your doctor. Do not stop taking it even if you start to feel better. Keep all follow-up visits. This information is not intended to replace advice given to you by your health care provider. Make sure you discuss any questions you have with your health care provider. Document Revised: 03/19/2021 Document Reviewed: 03/19/2021 Elsevier Patient Education  [place].

## 2021-12-20 LAB — COVID-19, FLU A+B AND RSV
Influenza A, NAA: NOT DETECTED
Influenza B, NAA: NOT DETECTED
RSV, NAA: NOT DETECTED
SARS-CoV-2, NAA: NOT DETECTED

## 2021-12-20 NOTE — Progress Notes (Signed)
Covid, flu and rsv are not detected.

## 2022-01-28 ENCOUNTER — Ambulatory Visit (INDEPENDENT_AMBULATORY_CARE_PROVIDER_SITE_OTHER): Payer: 59 | Admitting: Internal Medicine

## 2022-01-28 VITALS — BP 126/78 | HR 78 | Resp 18 | Ht 66.0 in | Wt 284.0 lb

## 2022-01-28 DIAGNOSIS — Z9989 Dependence on other enabling machines and devices: Secondary | ICD-10-CM | POA: Diagnosis not present

## 2022-01-28 DIAGNOSIS — Z7189 Other specified counseling: Secondary | ICD-10-CM

## 2022-01-28 DIAGNOSIS — G4733 Obstructive sleep apnea (adult) (pediatric): Secondary | ICD-10-CM | POA: Diagnosis not present

## 2022-01-28 NOTE — Patient Instructions (Signed)

## 2022-01-28 NOTE — Progress Notes (Signed)
[facility] [address] [place], [place] [address]  Pulmonary Sleep Medicine   Office Visit Note  Patient Name: Christine Michael DOB: 02-21-1966 MRN [id number]    Chief Complaint: Obstructive Sleep Apnea visit  Brief History:  Christine Michael is seen today for initial consult to establish care and for face to face notes to replace her current 58 year old unit.   Original symptoms included very loud snoring , drowsy driving, excessive daytime sleepiness. Original  It has dust contamination and cannot wirelessly download. The patient has a 9 year history of sleep apnea. Patient is using PAP nightly.  The patient feels much better and no longer has any problems sleeping nor snoring. after sleeping with PAP.  The patient reports benefiting  from PAP use. Reported sleepiness is  totally resolved. and the Epworth Sleepiness Score is 3 out of 24. The patient does not take naps. The patient complains of the following: needing a new machine  The compliance download shows  compliance with an average use time of 8:17  hours@ 96%. The AHI is 4.0.  The patient does not complain of limb movements disrupting sleep.  ROS  General: (-) fever, (-) chills, (-) night sweat Nose and Sinuses: (-) nasal stuffiness or itchiness, (-) postnasal drip, (-) nosebleeds, (-) sinus trouble. Mouth and Throat: (-) sore throat, (-) hoarseness. Neck: (-) swollen glands, (-) enlarged thyroid, (-) neck pain. Respiratory: - cough, - shortness of breath, - wheezing. Neurologic: - numbness, - tingling. Psychiatric: - anxiety, - depression   Current Medication: Outpatient Encounter Medications as of 01/28/2022  Medication Sig   amLODipine (NORVASC) 2.5 MG tablet Take 1 tablet (2.5 mg total) by mouth daily.   amoxicillin-clavulanate (AUGMENTIN) 875-125 MG tablet Take 1 tablet by mouth 2 (two) times daily.   atorvastatin (LIPITOR) 20 MG tablet TAKE 1 TABLET BY MOUTH AT  BEDTIME   citalopram (CELEXA) 20 MG tablet Take 1  tablet (20 mg total) by mouth daily.   Multiple Vitamins-Minerals (WOMENS 50+ MULTI VITAMIN/MIN PO) Take by mouth.   neomycin-polymyxin-hydrocortisone (CORTISPORIN) OTIC solution Place 3 drops into the left ear 3 (three) times daily.   triamterene-hydrochlorothiazide (MAXZIDE-25) 37.5-25 MG tablet Take 1 tablet by mouth daily.   No facility-administered encounter medications on file as of 01/28/2022.    Surgical History: Past Surgical History:  Procedure Laterality Date   ABDOMINAL HYSTERECTOMY  2010   partial both ovaries out   COLONOSCOPY  2015   [facility]   SALPINGOOPHORECTOMY Bilateral 07/24/2015   Procedure: ATTEMPTED LAPAROSCOPY, ABDOMINAL BILATERAL SALPINGO OOPHORECTOMY ;  Surgeon: Hildred Laser, MD;  Location: [facility] OR[facility];  Service: Gynecology;  Laterality: Bilateral;   TONSILLECTOMY     TUBAL LIGATION Bilateral 1988    Medical History: Past Medical History:  Diagnosis Date   Anxiety    panic/anxiety 12/2010 celexa 20 is helping   COVID-19    end 03/2021 early 04/2021   High cholesterol    Hypertension    Osteopenia     Family History: Non contributory to the present illness  Social History: Social History   Socioeconomic History   Marital status: Divorced    Spouse name: Not on file   Number of children: Not on file   Years of education: Not on file   Highest education level: Not on file  Occupational History   Not on file  Tobacco Use   Smoking status: Former    Packs/day: 0.25    Years: 20.00    Pack years: 5.00  Types: Cigarettes    Quit date: 07/12/2008    Years since quitting: 13.5   Smokeless tobacco: Never  Vaping Use   Vaping Use: Never used  Substance and Sexual Activity   Alcohol use: Yes    Alcohol/week: 0.0 - 1.0 standard drinks   Drug use: No   Sexual activity: Not Currently    Birth control/protection: Surgical  Other Topics Concern   Not on file  Social History Narrative   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 There is no height or weight on file to calculate BMI.    Examination: General Appearance: The patient is well-developed, well-nourished, and in no distress. Neck Circumference: 42 cm Skin: Gross inspection of skin unremarkable. Head: normocephalic, no gross deformities. Eyes: no gross deformities noted. ENT: ears appear grossly normal Neurologic: Alert and oriented. No involuntary movements.    EPWORTH SLEEPINESS SCALE:  Scale:  (0)= no chance of dozing; (1)= slight chance of dozing; (2)= moderate chance of dozing; (3)= high chance of dozing  Chance  Situtation    Sitting and reading: 1    Watching TV: 1    Sitting Inactive in public: 0    As a passenger in car: 1      Lying down to rest: 0    Sitting and talking: 0    Sitting quielty after lunch: 0    In a car, stopped in traffic: 0   TOTAL SCORE:   3 out of 24    SLEEP STUDIES:  PSG (06/2013) AHI 2.3/hr, REM AHI 25/hr, min SpO2 82%   CPAP COMPLIANCE DATA:  Date Range: 10/30/21 - 01/27/22  Average Daily Use: 8:17 hours  Median Use: 8:18 hours  Compliance for > 4 Hours: 96%  AHI: 4.0 respiratory events per hour  Days Used: 86/90  Mask Leak: n/a  95th Percentile Pressure: 16.1 cmH2O   LABS: Recent Results (from the past 2160 hour(s))  COVID-19, Flu A+B and RSV     Status: None   Collection Time: 12/18/21  3:38 PM   Specimen: Nasal Swab   Nasal Swab  Previously  Result Value Ref Range   SARS-CoV-2, NAA Not Detected Not Detected   Influenza A, NAA Not Detected Not Detected   Influenza B, NAA Not Detected Not Detected   RSV, NAA Not Detected Not Detected   Test  Information: Comment     Comment: This nucleic acid amplification test was developed and its performance characteristics determined by [facility]. Nucleic acid amplification tests include RT-PCR and TMA. This test has not been FDA cleared or approved. This test has been authorized by FDA under an Emergency Use Authorization (EUA). This test is only authorized for the duration of time the declaration that circumstances exist justifying the authorization of the emergency use of in vitro diagnostic tests for detection of SARS-CoV-2 virus and/or diagnosis of COVID-19 infection under section 564(b)(1) of the Act, 21 U.S.C. 440NUU-7(O360bbb-3(b) (1), unless the authorization is terminated or revoked sooner. When diagnostic testing is negative, the possibility of a false negative result should be considered in the  context of a patient's recent exposures and the presence of clinical signs and symptoms consistent with COVID-19. An individual without symptoms of COVID-19 and who is not shedding SARS-CoV-2 virus wo uld expect to have a negative (not detected) result in this assay.     Radiology: MM DIAG BREAST TOMO UNI LEFT  Result Date: 11/02/2021 CLINICAL DATA:  56 year old female presenting as a recall from screening for possible left breast mass. EXAM: DIGITAL DIAGNOSTIC UNILATERAL LEFT MAMMOGRAM WITH TOMOSYNTHESIS AND CAD TECHNIQUE: Left digital diagnostic mammography and breast tomosynthesis was performed. The images were evaluated with computer-aided detection. COMPARISON:  Previous exam(s). ACR Breast Density Category b: There are scattered areas of fibroglandular density. FINDINGS: Spot compression tomosynthesis views as well as full field mL tomosynthesis views of the left breast were performed. There is persistence of a small round mass in the medial left breast middle depth which is stable across multiple prior mammograms dating back to at least 2017, consistent with a benign process. There  is no new suspicious finding in the left breast. IMPRESSION: No mammographic evidence of malignancy in the left breast. RECOMMENDATION: Screening mammogram in one year.(Code:SM-B-01Y) I have discussed the findings and recommendations with the patient. If applicable, a reminder letter will be sent to the patient regarding the next appointment. BI-RADS CATEGORY  2: Benign. Electronically Signed   By: Emmaline Kluver M.D.   On: 11/02/2021 16:01   No results found.  No results found.    Assessment and Plan: Patient Active Problem List   Diagnosis Date Noted   Body aches 04/23/2021   Lab test positive for detection of COVID-19 virus 04/23/2021   Chest congestion 04/23/2021   OSA on CPAP 03/17/2020   Osteopenia    Intertrigo 07/15/2018   Morbid obesity with BMI of 50.0-59.9, adult (HCC) 04/26/2018   Prediabetes 04/11/2018   Medication monitoring encounter 04/09/2018   Anxiety 02/07/2017   Left foot pain 01/29/2017   Well woman exam with routine gynecological exam 07/29/2016   Family history of breast cancer in mother 07/29/2016   BPPV (benign paroxysmal positional vertigo) 06/10/2016   Hypertension 09/12/2015   High cholesterol 09/12/2015   Hyperglycemia 09/12/2015   Annual physical exam 08/08/2015   Arthritis, 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 OSA on CPAP The patient does tolerate PAP and reports definite benefit from PAP use.Machine has passed end of usable life and must be replaced.  The patient was reminded how to clean equipment and advised to replace supplies routinely. The patient was also counselled on weight loss. The compliance is excellent. The AHI is 4.0.   OSA- replace machine. F/u 30d after set up.    2. CPAP use counseling CPAP Counseling: had a lengthy discussion with the patient regarding the importance of PAP therapy in management of the sleep apnea. Patient appears to understand the risk factor reduction and also understands the risks associated with untreated sleep  apnea. Patient will try to make a good faith effort to remain compliant with therapy. Also instructed the patient on proper cleaning of the device including the water must be changed daily if possible and use of distilled water is preferred. Patient understands that the machine should be regularly cleaned with appropriate recommended cleaning solutions that do not damage the PAP machine for example given white vinegar and water rinses. Other methods such as ozone treatment may not be as good as these simple methods to achieve cleaning.   3. Morbid obesity (HCC) Obesity Counseling: Had a lengthy discussion regarding patients  BMI and weight issues. Patient was instructed on portion control as well as increased activity. Also discussed caloric restrictions with trying to maintain intake less than 2000 Kcal. Discussions were made in accordance with the 5As of weight management. Simple actions such as not eating late and if able to, taking a walk is suggested.    General Counseling: I have discussed the findings of the evaluation and examination with [facility].  I have also discussed any further diagnostic evaluation thatmay be needed or ordered today. Delanna verbalizes understanding of the findings of todays visit. We also reviewed her medications today and discussed drug interactions and side effects including but not limited excessive drowsiness and altered mental states. We also discussed that there is always a risk not just to her but also people around her. she has been encouraged to call the office with any questions or concerns that should arise related to todays visit.  No orders of the defined types were placed in this encounter.       I have personally obtained a history, examined the patient, evaluated laboratory and imaging results, formulated the assessment and plan and placed orders. This patient was seen today by Emmaline Kluver, PA-C in collaboration with Dr. Freda Munro.   Yevonne Pax, MD  [facility] Diplomate ABMS Pulmonary Critical Care Medicine and Sleep Medicine

## 2022-02-27 ENCOUNTER — Other Ambulatory Visit: Payer: Self-pay | Admitting: Internal Medicine

## 2022-02-27 DIAGNOSIS — E7800 Pure hypercholesterolemia, unspecified: Secondary | ICD-10-CM

## 2022-03-05 ENCOUNTER — Other Ambulatory Visit: Payer: Self-pay | Admitting: Internal Medicine

## 2022-03-05 DIAGNOSIS — F419 Anxiety disorder, unspecified: Secondary | ICD-10-CM

## 2022-03-28 ENCOUNTER — Other Ambulatory Visit: Payer: Self-pay | Admitting: Internal Medicine

## 2022-03-28 DIAGNOSIS — I10 Essential (primary) hypertension: Secondary | ICD-10-CM

## 2022-04-08 ENCOUNTER — Encounter: Payer: Self-pay | Admitting: Internal Medicine

## 2022-04-08 NOTE — Telephone Encounter (Signed)
Pt scheduled for OV on 04/10/22 @8 :20 am for L calf pain ?

## 2022-04-10 ENCOUNTER — Encounter: Payer: Self-pay | Admitting: Internal Medicine

## 2022-04-10 ENCOUNTER — Ambulatory Visit (INDEPENDENT_AMBULATORY_CARE_PROVIDER_SITE_OTHER): Payer: 59 | Admitting: Internal Medicine

## 2022-04-10 ENCOUNTER — Ambulatory Visit (INDEPENDENT_AMBULATORY_CARE_PROVIDER_SITE_OTHER): Payer: 59

## 2022-04-10 VITALS — BP 110/72 | HR 71 | Temp 98.2°F | Ht 66.0 in | Wt 286.6 lb

## 2022-04-10 DIAGNOSIS — M1712 Unilateral primary osteoarthritis, left knee: Secondary | ICD-10-CM

## 2022-04-10 DIAGNOSIS — L304 Erythema intertrigo: Secondary | ICD-10-CM | POA: Diagnosis not present

## 2022-04-10 DIAGNOSIS — M79662 Pain in left lower leg: Secondary | ICD-10-CM

## 2022-04-10 DIAGNOSIS — M7989 Other specified soft tissue disorders: Secondary | ICD-10-CM | POA: Diagnosis not present

## 2022-04-10 DIAGNOSIS — Z1231 Encounter for screening mammogram for malignant neoplasm of breast: Secondary | ICD-10-CM | POA: Diagnosis not present

## 2022-04-10 MED ORDER — HYDROCORTISONE 2.5 % EX OINT: TOPICAL_OINTMENT | Freq: Two times a day (BID) | CUTANEOUS | 11 refills | Status: DC

## 2022-04-10 MED ORDER — NYSTATIN 100000 UNIT/GM EX POWD: 1.0000 "application " | Freq: Three times a day (TID) | CUTANEOUS | 11 refills | Status: DC

## 2022-04-10 MED ORDER — NYSTATIN 100000 UNIT/GM EX CREA: 1.0000 "application " | TOPICAL_CREAM | Freq: Two times a day (BID) | CUTANEOUS | 11 refills | Status: DC

## 2022-04-10 MED ORDER — FLUCONAZOLE 150 MG PO TABS: 150.0000 mg | ORAL_TABLET | ORAL | 0 refills | Status: DC

## 2022-04-10 NOTE — Patient Instructions (Addendum)
Details ?Date Type Department Care Team Description  ?07/03/2016 Initial consult [facility]   ?[address]   ?[place], [place] [address]   ?[phone]   Poggi, Thalia Bloodgood, MD   ?[address]   ?[facility]   ?[place], [place] [address]   ?[phone]   ?[phone] (Fax)   ?  ?Anson [place], PA   ?[address]   ?[facility]   ?[place], [place] [address]   ?[phone]   ?[phone] (Fax)     ? ? ?Aspercream with lidocaine  ?Voltaren gel 4x per day  ?Both other the counter  ? ? ?Nystatin powder or Gold bond talc free powder  ? ? ?Intertrigo ?Intertrigo is skin irritation or inflammation (dermatitis) that occurs when folds of skin rub together. The irritation can cause a rash and make skin raw and itchy. This condition most commonly occurs in the skin folds of these areas: ?Toes. ?Armpits. ?Groin. ?Under the belly. ?Under the breasts. ?Buttocks. ?Intertrigo is not passed from person to person (is not contagious). ?What are the causes? ?This condition is caused by heat, moisture, rubbing (friction), and not enough air circulation. The condition can be made worse by: ?Sweat. ?Bacteria. ?A fungus, such as yeast. ?What increases the risk? ?This condition is more likely to occur if you have moisture in your skin folds. You are more likely to develop this condition if you: ?Have diabetes. ?Are overweight. ?Are not able to move around or are not active. ?Live in a warm and moist climate. ?Wear splints, braces, or other medical devices. ?Are not able to control your bowels or bladder (have incontinence). ?What are the signs or symptoms? ?Symptoms of this condition include: ?A pink or red skin rash in the skin fold or near the skin fold. ?Raw or scaly skin. ?Itchiness. ?A burning feeling. ?Bleeding. ?Leaking fluid. ?A bad smell. ?How is this diagnosed? ?This condition is diagnosed with a medical history and physical exam. You may also have a skin swab to test for  bacteria or a fungus. ?How is this treated? ?This condition may be treated by: ?Cleaning and drying your skin. ?Taking an antibiotic medicine or using an antibiotic skin cream for a bacterial infection. ?Using an antifungal cream on your skin or taking pills for an infection that was caused by a fungus, such as yeast. ?Using a steroid ointment to relieve itchiness and irritation. ?Separating the skin fold with a clean cotton cloth to absorb moisture and allow air to flow into the area. ?Follow these instructions at home: ?Keep the affected area clean and dry. ?Do not scratch your skin. ?Stay in a cool environment as much as possible. Use an air conditioner or fan, if available. ?Apply over-the-counter and prescription medicines only as told by your health care provider. ?If you were prescribed an antibiotic medicine, use it as told by your health care provider. Do not stop using the antibiotic even if your condition improves. ?Keep all follow-up visits as told by your health care provider. This is important. ?How is this prevented? ? ?Maintain a healthy weight. ?Take care of your feet, especially if you have diabetes. Foot care includes: ?Wearing shoes that fit well. ?Keeping your feet dry. ?Wearing clean, breathable socks. ?Protect the skin around your groin and buttocks, especially if you have incontinence. Skin protection includes: ?Following a regular cleaning routine. ?Using skin protectant creams, powders, or ointments. ?Changing protection pads frequently. ?Do not wear tight clothes. Wear clothes that are loose, absorbent, and made of  cotton. ?Wear a bra that gives good support, if needed. ?Shower and dry yourself well after activity or exercise. Use a hair dryer on a cool setting to dry between skin folds, especially after you bathe. ?If you have diabetes, keep your blood sugar under control. ?Contact a health care provider if: ?Your symptoms do not improve with treatment. ?Your symptoms get worse or they  spread. ?You notice increased redness and warmth. ?You have a fever. ?Summary ?Intertrigo is skin irritation or inflammation (dermatitis) that occurs when folds of skin rub together. ?This condition is caused by heat, moisture, rubbing (friction), and not enough air circulation. ?This condition may be treated by cleaning and drying your skin and with medicines. ?Apply over-the-counter and prescription medicines only as told by your health care provider. ?Keep all follow-up visits as told by your health care provider. This is important. ?This information is not intended to replace advice given to you by your health care provider. Make sure you discuss any questions you have with your health care provider. ?Document Revised: 09/24/2021 Document Reviewed: 09/24/2021 ?Elsevier Patient Education ? 2023 [employer] ? ?Muscle Cramps and Spasms ?Muscle cramps and spasms occur when a muscle or muscles tighten and you have no control over this tightening (involuntary muscle contraction). They are a common problem and can develop in any muscle. The most common place is in the calf muscles of the leg. Muscle cramps and muscle spasms are both involuntary muscle contractions, but there are some differences between the two: ?Muscle cramps are painful. They come and go and may last for a few seconds or up to 15 minutes. Muscle cramps are often more forceful and last longer than muscle spasms. ?Muscle spasms may or may not be painful. They may also last just a few seconds or much longer. ?Certain medical conditions, such as diabetes or Parkinson's disease, can make it more likely to develop cramps or spasms. However, cramps or spasms are usually not caused by a serious underlying problem. Common causes include: ?Doing more physical work or exercise than your body is ready for (overexertion). ?Overuse from repeating certain movements too many times. ?Remaining in a certain position for a long period of time. ?Improper preparation,  form, or technique while playing a sport or doing an activity. ?Dehydration. ?Injury. ?Side effects of some medicines. ?Abnormally low levels of the salts and minerals in your blood (electrolytes), especially potassium and calcium. This could happen if you are taking water pills (diuretics) or if you are pregnant. ?In many cases, the cause of muscle cramps or spasms is not known. ?Follow these instructions at home: ?Managing pain and stiffness ? ?  ? ?Try massaging, stretching, and relaxing the affected muscle. Do this for several minutes at a time. ?If directed, apply heat to tight or tense muscles as often as told by your health care provider. Use the heat source that your health care provider recommends, such as a moist heat pack or a heating pad. ?Place a towel between your skin and the heat source. ?Leave the heat on for 20-30 minutes. ?Remove the heat if your skin turns bright red. This is especially important if you are unable to feel pain, heat, or cold. You may have a greater risk of getting burned. ?If directed, put ice on the affected area. This may help if you are sore or have pain after a cramp or spasm. ?Put ice in a plastic bag. ?Place a towel between your skin and the bag. ?Leave the ice on for  20 minutes, 2-3 times a day. ?Try taking hot showers or baths to help relax tight muscles. ?Eating and drinking ?Drink enough fluid to keep your urine pale yellow. Staying well hydrated may help prevent cramps or spasms. ?Eat a healthy diet that includes plenty of nutrients to help your muscles function. A healthy diet includes fruits and vegetables, lean protein, whole grains, and low-fat or nonfat dairy products. ?General instructions ?If you are having frequent cramps, avoid intense exercise for several days. ?Take over-the-counter and prescription medicines only as told by your health care provider. ?Pay attention to any changes in your symptoms. ?Keep all follow-up visits as told by your health care  provider. This is important. ?Contact a health care provider if: ?Your cramps or spasms get more severe or happen more often. ?Your cramps or spasms do not improve over time. ?Summary ?Muscle cramps and spasms occu

## 2022-04-10 NOTE — Progress Notes (Signed)
Chief Complaint  ?Patient presents with  ? left calf pain  ?  Started last week after a cramp. She then has knee pain, some swelling & felt that her skin was on fire. The back of her calf still feels tight but all other sx resolved.  ? ?F/u  ?1. Left knee front and back pain moderate 1 week ago with swelling in back of knee and pain and burning to the skin of the knee and lower leg. She had charlie hoarse  1 week ago which started all of this and burning calmed down 1 days later tried icing otc. Area of knee/calf felt tight but better and swelling better in 06/2016 Xray moderate OA left knee will re image toda y ?2. C/o skin rash under abdomen with burning and pain after bathing nothing tried but she does sweat a lot in the area  ? ? ?Review of Systems  ?Constitutional:  Negative for weight loss.  ?HENT:  Negative for hearing loss.   ?Eyes:  Negative for blurred vision.  ?Respiratory:  Negative for shortness of breath.   ?Cardiovascular:  Negative for chest pain.  ?Gastrointestinal:  Negative for abdominal pain and blood in stool.  ?Genitourinary:  Negative for dysuria.  ?Musculoskeletal:  Positive for joint pain. Negative for falls.  ?Skin:  Positive for rash.  ?Neurological:  Negative for headaches.  ?Psychiatric/Behavioral:  Negative for depression.   ?Past Medical History:  ?Diagnosis Date  ? Anxiety   ? panic/anxiety 12/2010 celexa 20 is helping  ? COVID-19   ? end 03/2021 early 04/2021  ? High cholesterol   ? Hypertension   ? Osteopenia   ? ?Past Surgical History:  ?Procedure Laterality Date  ? ABDOMINAL HYSTERECTOMY  2010  ? partial both ovaries out  ? COLONOSCOPY  2015  ? [facility]  ? SALPINGOOPHORECTOMY Bilateral 07/24/2015  ? Procedure: ATTEMPTED LAPAROSCOPY, ABDOMINAL BILATERAL SALPINGO OOPHORECTOMY ;  Surgeon: Rubie Maid, MD;  Location: [facility] ORS;  Service: Gynecology;  Laterality: Bilateral;  ? TONSILLECTOMY    ? TUBAL LIGATION Bilateral 1988  ? ?Family History  ?Problem Relation Age of Onset  ? Cancer  Mother   ? Breast cancer Mother 10  ?     met to brain  ? Alcohol abuse Father   ? Heart failure Father   ? Renal Disease Sister   ?     on HD  ? ?Social History  ? ?Socioeconomic History  ? Marital status: Divorced  ?  Spouse name: Not on file  ? Number of children: Not on file  ? Years of education: Not on file  ? Highest education level: Not on file  ?Occupational History  ? Not on file  ?Tobacco Use  ? Smoking status: Former  ?  Packs/day: 0.25  ?  Years: 20.00  ?  Pack years: 5.00  ?  Types: Cigarettes  ?  Quit date: 07/12/2008  ?  Years since quitting: 13.7  ? Smokeless tobacco: Never  ?Vaping Use  ? Vaping Use: Never used  ?Substance and Sexual Activity  ? Alcohol use: Yes  ?  Alcohol/week: 0.0 - 1.0 standard drinks  ? Drug use: No  ? Sexual activity: Not Currently  ?  Birth control/protection: Surgical  ?Other Topics Concern  ? Not on file  ?Social History Narrative  ? 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 1 son 42 as of 10/18/21   ?   ? ?Social Determinants of Health  ? ?Financial Resource Strain: Not on file  ?  Food Insecurity: Not on file  ?Transportation Needs: Not on file  ?Physical Activity: Not on file  ?Stress: Not on file  ?Social Connections: Not on file  ?Intimate Partner Violence: Not on file  ? ?Current Meds  ?Medication Sig  ? amLODipine (NORVASC) 2.5 MG tablet Take 1 tablet (2.5 mg total) by mouth daily.  ? atorvastatin (LIPITOR) 20 MG tablet TAKE 1 TABLET BY MOUTH AT  BEDTIME  ? citalopram (CELEXA) 20 MG tablet TAKE 1 TABLET BY MOUTH  DAILY  ? fluconazole (DIFLUCAN) 150 MG tablet Take 1 tablet (150 mg total) by mouth once a week. X 2 to 4 weeks  ? hydrocortisone 2.5 % ointment Apply topically 2 (two) times daily.  ? Multiple Vitamins-Minerals (WOMENS 50+ MULTI VITAMIN/MIN PO) Take by mouth.  ? nystatin (MYCOSTATIN/NYSTOP) powder Apply 1 application. topically 3 (three) times daily. Under belly as needed  ? nystatin cream (MYCOSTATIN) Apply 1 application. topically 2 (two) times daily.  ?  triamterene-hydrochlorothiazide (MAXZIDE-25) 37.5-25 MG tablet TAKE 1 TABLET BY MOUTH  DAILY  ? ?No Known Allergies ?No results found for this or any previous visit (from the past 2160 hour(s)). ?Objective  ?Body mass index is 46.26 kg/m?. ?Wt Readings from Last 3 Encounters:  ?04/10/22 286 lb 9.6 oz (130 kg)  ?01/28/22 284 lb (128.8 kg)  ?12/18/21 283 lb 12.8 oz (128.7 kg)  ? ?Temp Readings from Last 3 Encounters:  ?04/10/22 98.2 ?F (36.8 ?C) (Oral)  ?12/18/21 98.3 ?F (36.8 ?C)  ?10/18/21 98.3 ?F (36.8 ?C) (Oral)  ? ?BP Readings from Last 3 Encounters:  ?04/10/22 110/72  ?01/28/22 126/78  ?12/18/21 132/84  ? ?Pulse Readings from Last 3 Encounters:  ?04/10/22 71  ?01/28/22 78  ?12/18/21 78  ? ? ?Physical Exam ?Vitals and nursing note reviewed.  ?Constitutional:   ?   Appearance: Normal appearance. She is well-developed and well-groomed.  ?HENT:  ?   Head: Normocephalic and atraumatic.  ?Eyes:  ?   Conjunctiva/sclera: Conjunctivae normal.  ?   Pupils: Pupils are equal, round, and reactive to light.  ?Cardiovascular:  ?   Rate and Rhythm: Normal rate and regular rhythm.  ?   Heart sounds: Normal heart sounds. No murmur heard. ?Pulmonary:  ?   Effort: Pulmonary effort is normal.  ?   Breath sounds: Normal breath sounds.  ?Abdominal:  ?   General: Abdomen is flat. Bowel sounds are normal.  ?   Tenderness: There is no abdominal tenderness.  ?   Comments: +intertrigo pannus  ?Musculoskeletal:  ?   Left knee: Swelling present. Tenderness present over the medial joint line, lateral joint line and patellar tendon.  ?Skin: ?   General: Skin is warm and dry.  ?Neurological:  ?   General: No focal deficit present.  ?   Mental Status: She is alert and oriented to person, place, and time. Mental status is at baseline.  ?   Cranial Nerves: Cranial nerves 2-12 are intact.  ?   Sensory: Sensation is intact.  ?   Motor: Motor function is intact.  ?   Coordination: Coordination is intact.  ?   Gait: Gait is intact.  ?Psychiatric:      ?   Attention and Perception: Attention and perception normal.     ?   Mood and Affect: Mood and affect normal.     ?   Speech: Speech normal.     ?   Behavior: Behavior normal. Behavior is cooperative.     ?  Thought Content: Thought content normal.     ?   Cognition and Memory: Cognition and memory normal.     ?   Judgment: Judgment normal.  ? ? ?Assessment  ?Plan  ?Arthritis of left knee h/o moderate OA 06/2016 - Plan: DG Knee Complete 4 Views Left ?Pain and swelling of left lower leg - Plan: DG Knee Complete 4 Views Left ?Declines muscle relaxer, pain med ?Consider referral to [facility] ortho in future  ? ?Intertrigo - Plan: fluconazole (DIFLUCAN) 150 MG tablet, nystatin (MYCOSTATIN/NYSTOP) powder, nystatin cream (MYCOSTATIN), hydrocortisone 2.5 % ointment  ? ?HM ?Flu shot utd ?covid vaccine 2/2 booster due 12/2020, 01/2021 not had 3rd 03/21/21 as of 10/18/21 not sure if will get this  ?tdap and shingrix utd  ?MMR immune  ?  ?Hep C neg 09/02/17  ?Declines HIV  ?Pap neg 09/14/19 neg neg HPV ?mammo [place] 10/07/20 negative sch today 10/18/21 had recall 11/02/21 left breast neg ordered 11/02/22  ? ?colonoscopy had in [place] 08/2014 due in 2025  ?rec healthy diet and exercise  ?Provider: Dr. Olivia Mackie McLean-Scocuzza-Internal Medicine  ?

## 2022-04-15 ENCOUNTER — Encounter: Payer: Self-pay | Admitting: Internal Medicine

## 2022-04-15 NOTE — Addendum Note (Signed)
Addended by: Quentin Ore on: 04/15/2022 08:38 AM ? ? Modules accepted: Orders ? ?

## 2022-05-09 ENCOUNTER — Encounter: Payer: Self-pay | Admitting: Internal Medicine

## 2022-06-05 ENCOUNTER — Encounter: Payer: Self-pay | Admitting: Internal Medicine

## 2022-06-05 ENCOUNTER — Ambulatory Visit (INDEPENDENT_AMBULATORY_CARE_PROVIDER_SITE_OTHER): Payer: 59 | Admitting: Internal Medicine

## 2022-06-05 VITALS — BP 130/80 | HR 89 | Temp 98.7°F | Resp 14 | Ht 66.0 in | Wt 285.4 lb

## 2022-06-05 DIAGNOSIS — B379 Candidiasis, unspecified: Secondary | ICD-10-CM

## 2022-06-05 DIAGNOSIS — E785 Hyperlipidemia, unspecified: Secondary | ICD-10-CM

## 2022-06-05 DIAGNOSIS — E559 Vitamin D deficiency, unspecified: Secondary | ICD-10-CM

## 2022-06-05 DIAGNOSIS — R7303 Prediabetes: Secondary | ICD-10-CM

## 2022-06-05 DIAGNOSIS — Z1389 Encounter for screening for other disorder: Secondary | ICD-10-CM

## 2022-06-05 DIAGNOSIS — L304 Erythema intertrigo: Secondary | ICD-10-CM

## 2022-06-05 DIAGNOSIS — Z1329 Encounter for screening for other suspected endocrine disorder: Secondary | ICD-10-CM

## 2022-06-05 DIAGNOSIS — L0291 Cutaneous abscess, unspecified: Secondary | ICD-10-CM | POA: Diagnosis not present

## 2022-06-05 DIAGNOSIS — T148XXA Other injury of unspecified body region, initial encounter: Secondary | ICD-10-CM | POA: Diagnosis not present

## 2022-06-05 DIAGNOSIS — Z0000 Encounter for general adult medical examination without abnormal findings: Secondary | ICD-10-CM

## 2022-06-05 DIAGNOSIS — L819 Disorder of pigmentation, unspecified: Secondary | ICD-10-CM

## 2022-06-05 MED ORDER — OXYCODONE-ACETAMINOPHEN 5-325 MG PO TABS: 1.0000 | ORAL_TABLET | Freq: Two times a day (BID) | ORAL | 0 refills | Status: DC | PRN

## 2022-06-05 MED ORDER — FLUCONAZOLE 150 MG PO TABS: 150.0000 mg | ORAL_TABLET | Freq: Once | ORAL | 0 refills | Status: AC

## 2022-06-05 MED ORDER — MUPIROCIN 2 % EX OINT: 1.0000 "application " | TOPICAL_OINTMENT | Freq: Two times a day (BID) | CUTANEOUS | 0 refills | Status: DC

## 2022-06-05 MED ORDER — DOXYCYCLINE HYCLATE 100 MG PO TABS: 100.0000 mg | ORAL_TABLET | Freq: Two times a day (BID) | ORAL | 0 refills | Status: DC

## 2022-06-05 NOTE — Progress Notes (Signed)
Chief Complaint  Patient presents with   Herpes Zoster    Pt c/o burning under skin, and push bubbles in legs and L breast, onset Sunday.    Cyst    L side of body, pt states painful to touch, full of fluid, leaking blood.    F/u  1. Blisters x 2 to b/l lower thighs red and painful and ulcerated 1 to left upper chest and left flank with red knot since Sunday and pus coming out no pets at home and no one else at home with similar sxs She is in severe pain try otc lidocaine w/o relief   Review of Systems  Constitutional:  Negative for weight loss.  HENT:  Negative for hearing loss.   Eyes:  Negative for blurred vision.  Respiratory:  Negative for shortness of breath.   Cardiovascular:  Negative for chest pain.  Gastrointestinal:  Negative for abdominal pain and blood in stool.  Genitourinary:  Negative for dysuria.  Musculoskeletal:  Negative for falls and joint pain.  Skin:  Negative for rash.  Neurological:  Negative for headaches.  Psychiatric/Behavioral:  Negative for depression.    Past Medical History:  Diagnosis Date   Anxiety    panic/anxiety 12/2010 celexa 20 is helping   COVID-19    end 03/2021 early 04/2021   High cholesterol    Hypertension    Osteopenia    Past Surgical History:  Procedure Laterality Date   ABDOMINAL HYSTERECTOMY  2010   partial both ovaries out   COLONOSCOPY  2015   [facility]   SALPINGOOPHORECTOMY Bilateral 07/24/2015   Procedure: ATTEMPTED LAPAROSCOPY, ABDOMINAL BILATERAL SALPINGO OOPHORECTOMY ;  Surgeon: Rubie Maid, MD;  Location: [facility] ORS;  Service: Gynecology;  Laterality: Bilateral;   TONSILLECTOMY     TUBAL LIGATION Bilateral 1988   Family History  Problem Relation Age of Onset   Cancer Mother    Breast cancer Mother 34       met to brain   Alcohol abuse Father    Heart failure Father    Renal Disease Sister        on HD   Social History   Socioeconomic History   Marital status: Divorced    Spouse name: Not on file    Number of children: Not on file   Years of education: Not on file   Highest education level: Not on file  Occupational History   Not on file  Tobacco Use   Smoking status: Former    Packs/day: 0.25    Years: 20.00    Total pack years: 5.00    Types: Cigarettes    Quit date: 07/12/2008    Years since quitting: 13.9   Smokeless tobacco: Never  Vaping Use   Vaping Use: Never used  Substance and Sexual Activity   Alcohol use: Yes    Alcohol/week: 0.0 - 1.0 standard drinks of alcohol   Drug use: No   Sexual activity: Not Currently    Birth control/protection: Surgical  Other Topics Concern   Not on file  Social History Narrative   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   atorvastatin (LIPITOR) 20 MG tablet TAKE 1 TABLET BY MOUTH AT  BEDTIME   citalopram (CELEXA) 20 MG tablet TAKE 1 TABLET BY MOUTH  DAILY   doxycycline (VIBRA-TABS) 100 MG tablet Take 1 tablet (100 mg total) by mouth 2 (two) times daily. With food   fluconazole (DIFLUCAN) 150 MG tablet Take 1 tablet (150 mg total) by mouth once a week. X 2 to 4 weeks   fluconazole (DIFLUCAN) 150 MG tablet Take 1 tablet (150 mg total) by mouth once for 1 dose.   hydrocortisone 2.5 % ointment Apply topically 2 (two) times daily.   Multiple Vitamins-Minerals (WOMENS 50+ MULTI VITAMIN/MIN PO) Take by mouth.   mupirocin ointment (BACTROBAN) 2 % Apply 1 application  topically 2 (two) times daily. X 5 days to nose and to body open wounds   nystatin (MYCOSTATIN/NYSTOP) powder Apply 1 application. topically 3 (three) times daily. Under belly as needed    nystatin cream (MYCOSTATIN) Apply 1 application. topically 2 (two) times daily.   oxyCODONE-acetaminophen (PERCOCET/ROXICET) 5-325 MG tablet Take 1 tablet by mouth 2 (two) times daily as needed for severe pain.   triamterene-hydrochlorothiazide (MAXZIDE-25) 37.5-25 MG tablet TAKE 1 TABLET BY MOUTH  DAILY   No Known Allergies No results found for this or any previous visit (from the past 2160 hour(s)). Objective  Body mass index is 46.06 kg/m. Wt Readings from Last 3 Encounters:  06/05/22 285 lb 6.4 oz (129.5 kg)  04/10/22 286 lb 9.6 oz (130 kg)  01/28/22 284 lb (128.8 kg)   Temp Readings from Last 3 Encounters:  06/05/22 98.7 F (37.1 C) (Oral)  04/10/22 98.2 F (36.8 C) (Oral)  12/18/21 98.3 F (36.8 C)   BP Readings from Last 3 Encounters:  06/05/22 130/80  04/10/22 110/72  01/28/22 126/78   Pulse Readings from Last 3 Encounters:  06/05/22 89  04/10/22 71  01/28/22 78    Physical Exam Vitals and nursing note reviewed.  Constitutional:      Appearance: Normal appearance. She is well-developed and well-groomed.  HENT:     Head: Normocephalic and atraumatic.  Eyes:     Conjunctiva/sclera: Conjunctivae normal.     Pupils: Pupils are equal, round, and reactive to light.  Cardiovascular:     Rate and Rhythm: Normal rate and regular rhythm.     Heart sounds: Normal heart sounds. No murmur heard. Pulmonary:     Effort: Pulmonary effort is normal.     Breath sounds: Normal breath sounds.  Abdominal:     General: Abdomen is flat. Bowel sounds are normal.     Tenderness: There is no abdominal tenderness.  Musculoskeletal:        General: No tenderness.  Skin:    General: Skin is warm and dry.       Neurological:     General: No focal deficit present.     Mental Status: She is alert and oriented to person, place, and time. Mental status is at baseline.     Cranial Nerves: Cranial nerves 2-12 are intact.     Motor: Motor function is intact.     Coordination:  Coordination is intact.     Gait: Gait is intact.  Psychiatric:        Attention and Perception: Attention and perception normal.        Mood and Affect: Mood and affect normal.  Speech: Speech normal.        Behavior: Behavior normal. Behavior is cooperative.        Thought Content: Thought content normal.        Cognition and Memory: Cognition and memory normal.        Judgment: Judgment normal.     Assessment  Plan  Blistering - Plan: Ambulatory referral to Dermatology  Abscess - Plan: doxycycline (VIBRA-TABS) 100 MG tablett bid #20 oxyCODONE-acetaminophen (PERCOCET/ROXICET) 5-325 MG tablet, Ambulatory referral to Dermatology  Open wound - Plan: mupirocin ointment (BACTROBAN) 2 %, doxycycline (VIBRA-TABS) 100 MG tablet, oxyCODONE-acetaminophen (PERCOCET/ROXICET) 5-325 MG tablet, Ambulatory referral to Dermatology  Yeast infection - Plan: fluconazole (DIFLUCAN) 150 MG tablet  Intertrigo - Plan: Ambulatory referral to Dermatology  Hyperpigmentation - Plan: Ambulatory referral to Dermatology  Provider: Dr. Olivia Mackie McLean-Scocuzza-Internal Medicine

## 2022-06-05 NOTE — Patient Instructions (Addendum)
Bactine max antiseptic with lidocaine  Bactroban to nose 2x per day x 5 days and to open wounds-prescriptoin Chlorhexadine antibacterial soap     [address] [place], [place] [address] Hours:  Open ? Closes 5?PM Phone: [phone]  Blisters, Adult  A blister is a raised bubble of skin filled with liquid. Blisters often develop on skin that rubs or presses against another surface repeatedly (friction blister). Friction blisters can occur on any part of the body, but they usually form on the hands or the feet. Long-term pressure on that same area of skin can also cause the area of skin to become hardened. This hardened skin is called a callus. What are the causes? Besides friction, blisters can be caused by: An injury, such as a burn. An allergic reaction. An infection. Exposure to irritating chemicals. Friction blisters often occur in areas with a lot of heat and moisture. Friction blisters often result from: Sports. Repetitive activities. Using tools and doing other activities without wearing gloves. Shoes that are too tight or too loose. What are the signs or symptoms? A blister is often round and looks like a bump. It may hurt or feel itchy. Before a blister forms, your skin may: Turn red. Feel warm. Itch. Be painful to the touch. How is this diagnosed? A blister is diagnosed with a physical exam. How is this treated? Treatment usually involves protecting the area where the blister has formed until your skin has healed. Treatments may include: Using a bandage (dressing) to cover your blister. Putting extra padding around and over your blister so that the blister does not rub on anything. Applying antibiotic ointment. Most blisters break open, dry up, and go away on their own within 1-2 weeks. Blisters that are very painful may be drained before they break open on their own. If your blister is large or painful, your health care provider can drain it. Follow these instructions  at home: Medicines Take or apply over-the-counter and prescription medicines only as told by your health care provider. If you were prescribed an antibiotic medicine, take or apply it as told by your health care provider. Do not stop using the antibiotic even if you start to feel better. Skin care Do not pop your blister. This can cause infection. Keep your blister clean and dry. This helps to prevent infection. Before you swim or use a hot tub, cover your blister with a waterproof dressing. Protect the area where your blister has formed as told by your health care provider. Follow instructions from your health care provider about how to take care of your blister. Make sure you: Wash your hands with soap and water for at least 20 seconds before and after you change your dressing. If soap and water are not available, use hand sanitizer. Change your dressing as told by your health care provider. Infection signs Check your blister every day for signs of infection. Check for: More redness, swelling, or pain. More fluid or blood. Warmth. Pus or a bad smell. General instructions If you have a blister on a foot or toe, wear different shoes until your blister heals. Avoid the activity that caused the blister until your blister heals. Keep all follow-up visits as told by your health care provider. This is important. How is this prevented? Taking these steps can help to prevent blisters that are caused by friction. Make sure you: Wear comfortable shoes that fit well. Always wear socks with shoes. Wear extra socks or use tape, dressings, or  pads over blister-prone areas as needed. You may also apply petroleum jelly under dressings in blister-prone areas. Wear protective gear, such as gloves, when taking part in sports or activities that can cause blisters. Wear loose-fitting, moisture-wicking clothes when taking part in sports or activities. Use powders as needed to keep your feet dry. Contact a  health care provider if: You have more redness, swelling, or pain around your blister. You have more fluid or blood coming from your blister. Your blister feels warm to the touch. You have pus or a bad smell coming from your blister. You have a fever or chills. Your blister gets better and then it gets worse. Summary A blister is a raised bubble of skin filled with liquid. Blisters often develop on skin that rubs or presses against another surface repeatedly (friction blister). Most blisters break open, dry up, and go away on their own within 1-2 weeks. Keep your blister clean and dry. This helps to prevent infection. Take steps to help prevent blisters that are caused by friction. Contact a health care provider if you have signs of infection. This information is not intended to replace advice given to you by your health care provider. Make sure you discuss any questions you have with your health care provider. Document Revised: 10/28/2019 Document Reviewed: 10/28/2019 Elsevier Patient Education  2023 [employer].

## 2022-06-07 ENCOUNTER — Encounter: Payer: Self-pay | Admitting: Internal Medicine

## 2022-06-10 ENCOUNTER — Other Ambulatory Visit: Payer: Self-pay | Admitting: Internal Medicine

## 2022-06-10 DIAGNOSIS — I10 Essential (primary) hypertension: Secondary | ICD-10-CM

## 2022-10-23 ENCOUNTER — Ambulatory Visit (INDEPENDENT_AMBULATORY_CARE_PROVIDER_SITE_OTHER): Payer: 59 | Admitting: Family Medicine

## 2022-10-23 ENCOUNTER — Encounter: Payer: 59 | Admitting: Internal Medicine

## 2022-10-23 ENCOUNTER — Encounter: Payer: Self-pay | Admitting: Family Medicine

## 2022-10-23 VITALS — BP 120/80 | HR 78 | Temp 98.6°F | Ht 66.0 in | Wt 287.0 lb

## 2022-10-23 DIAGNOSIS — L0291 Cutaneous abscess, unspecified: Secondary | ICD-10-CM | POA: Diagnosis not present

## 2022-10-23 HISTORY — DX: Cutaneous abscess, unspecified: L02.91

## 2022-10-23 MED ORDER — MUPIROCIN 2 % EX OINT: 1.0000 | TOPICAL_OINTMENT | Freq: Two times a day (BID) | CUTANEOUS | 0 refills | Status: DC

## 2022-10-23 MED ORDER — OXYCODONE-ACETAMINOPHEN 5-325 MG PO TABS: 1.0000 | ORAL_TABLET | Freq: Two times a day (BID) | ORAL | 0 refills | Status: DC | PRN

## 2022-10-23 MED ORDER — DOXYCYCLINE HYCLATE 100 MG PO TABS: 100.0000 mg | ORAL_TABLET | Freq: Two times a day (BID) | ORAL | 0 refills | Status: DC

## 2022-10-23 NOTE — Assessment & Plan Note (Addendum)
Patient has abscess with surrounding cellulitis.  The abscess appears to have drained already.  Discussed treatment with doxycycline 100 mg twice daily for 10 days given that that was beneficial previously.  Discussed risk of skin sensitivity to the sun and diarrhea with this medication.  She will also use topical Bactroban as that was beneficial previously.  Small prescription of Percocet prescribed for pain relief given significant pain.  Discussed risk of drowsiness and constipation.  Discussed risk of addiction and dependence if used frequently. She was advised to call on Friday if not improving some by then.

## 2022-10-23 NOTE — Patient Instructions (Signed)
Nice to see you. If you develop diarrhea while on the antibiotic please let us know. Please make sure you wear good skin protection when you are out in the sun while on the doxycycline. If you get drowsy or excessively constipated with the Percocet please stop using it and let us know.

## 2022-10-23 NOTE — Progress Notes (Signed)
Marikay Alar, MD Phone: [phone]  Christine Michael is a 56 y.o. female who presents today for f/u.  Abscess: Patient notes skin infection on her right lower leg.  This started on Monday as a small whitehead and then progressed to a possible per her report.  It has opened up and drained.  She had something similar back in June and was treated with doxycycline and topical antibiotics.  She has significant pain related to this and was previously treated with Percocet for that pain.  She did see dermatology previously and notes they advised her that the biopsy just revealed infection.  She noted no drowsiness when taking Percocet previously.  Social History   Tobacco Use  Smoking Status Former   Packs/day: 0.25   Years: 20.00   Total pack years: 5.00   Types: Cigarettes   Quit date: 07/12/2008   Years since quitting: 14.2  Smokeless Tobacco Never    Current Outpatient Medications on File Prior to Visit  Medication Sig Dispense Refill   amLODipine (NORVASC) 2.5 MG tablet TAKE 1 TABLET BY MOUTH  DAILY 90 tablet 3   atorvastatin (LIPITOR) 20 MG tablet TAKE 1 TABLET BY MOUTH AT  BEDTIME 90 tablet 3   citalopram (CELEXA) 20 MG tablet TAKE 1 TABLET BY MOUTH  DAILY 90 tablet 3   hydrocortisone 2.5 % ointment Apply topically 2 (two) times daily. 30 g 11   Multiple Vitamins-Minerals (WOMENS 50+ MULTI VITAMIN/MIN PO) Take by mouth.     nystatin (MYCOSTATIN/NYSTOP) powder Apply 1 application. topically 3 (three) times daily. Under belly as needed 60 g 11   nystatin cream (MYCOSTATIN) Apply 1 application. topically 2 (two) times daily. 30 g 11   triamterene-hydrochlorothiazide (MAXZIDE-25) 37.5-25 MG tablet TAKE 1 TABLET BY MOUTH  DAILY 90 tablet 3   fluconazole (DIFLUCAN) 150 MG tablet Take 1 tablet (150 mg total) by mouth once a week. X 2 to 4 weeks (Patient not taking: Reported on 10/23/2022) 4 tablet 0   No current facility-administered medications on file prior to visit.     ROS see  history of present illness  Objective  Physical Exam Vitals:   10/23/22 1606  BP: 120/80  Pulse: 78  Temp: 98.6 F (37 C)  SpO2: 98%    BP Readings from Last 3 Encounters:  10/23/22 120/80  06/05/22 130/80  04/10/22 110/72   Wt Readings from Last 3 Encounters:  10/23/22 287 lb (130.2 kg)  06/05/22 285 lb 6.4 oz (129.5 kg)  04/10/22 286 lb 9.6 oz (130 kg)    Physical Exam  Area of erythema has induration and significant tenderness, no fluctuance noted  Assessment/Plan: Please see individual problem list.  Problem List Items Addressed This Visit     Abscess - Primary    Patient has abscess with surrounding cellulitis.  The abscess appears to have drained already.  Discussed treatment with doxycycline 100 mg twice daily for 10 days given that that was beneficial previously.  Discussed risk of skin sensitivity to the sun and diarrhea with this medication.  She will also use topical Bactroban as that was beneficial previously.  Small prescription of Percocet prescribed for pain relief given significant pain.  Discussed risk of drowsiness and constipation.  Discussed risk of addiction and dependence if used frequently. She was advised to call on Friday if not improving some by then.       Relevant Medications   mupirocin ointment (BACTROBAN) 2 %   oxyCODONE-acetaminophen (PERCOCET/ROXICET) 5-325 MG tablet  doxycycline (VIBRA-TABS) 100 MG tablet     Return if symptoms worsen or fail to improve.   Tommi Rumps, MD [place]

## 2022-10-31 ENCOUNTER — Encounter: Payer: Self-pay | Admitting: Internal Medicine

## 2022-11-01 ENCOUNTER — Telehealth: Payer: Self-pay

## 2022-11-01 DIAGNOSIS — L0291 Cutaneous abscess, unspecified: Secondary | ICD-10-CM

## 2022-11-01 MED ORDER — DOXYCYCLINE HYCLATE 100 MG PO TABS: 100.0000 mg | ORAL_TABLET | Freq: Two times a day (BID) | ORAL | 0 refills | Status: DC

## 2022-11-01 NOTE — Telephone Encounter (Signed)
I have sent in another round of antibiotics, though if it is not resolving in the next few days with this round of antibiotics she needs to be evaluated in person again.

## 2022-11-01 NOTE — Telephone Encounter (Signed)
I called the patient and informed her that the provider sent in another round of antibiotics I also informed the patient that if this round does not clear things up she would need a in-person visit and she understood. Deborha Moseley,cma

## 2022-11-01 NOTE — Addendum Note (Signed)
Addended by: Birdie Sons, Rodert Hinch G on: 11/01/2022 10:02 AM   Modules accepted: Orders

## 2022-11-01 NOTE — Telephone Encounter (Signed)
Pt called requesting another round of abx, due to abscess she had in her leg. Pt was seen by Dr.Sonnenberg on 11/1 & was given doxy. Pt states abx helped, area no longer has erythema, or any drainage at this time, area just remains tender. She states she had previous issue in the past & they had to prescribe her another round of abx for it to completely clear up. Pt's preferred pharmacy is [facility] in [place].

## 2022-11-13 ENCOUNTER — Encounter: Payer: Self-pay | Admitting: Family Medicine

## 2022-11-13 ENCOUNTER — Ambulatory Visit (INDEPENDENT_AMBULATORY_CARE_PROVIDER_SITE_OTHER): Payer: 59 | Admitting: Family Medicine

## 2022-11-13 VITALS — BP 118/80 | HR 80 | Temp 98.5°F | Ht 66.0 in | Wt 288.2 lb

## 2022-11-13 DIAGNOSIS — Z1231 Encounter for screening mammogram for malignant neoplasm of breast: Secondary | ICD-10-CM | POA: Insufficient documentation

## 2022-11-13 DIAGNOSIS — F419 Anxiety disorder, unspecified: Secondary | ICD-10-CM

## 2022-11-13 DIAGNOSIS — Z23 Encounter for immunization: Secondary | ICD-10-CM

## 2022-11-13 DIAGNOSIS — R7303 Prediabetes: Secondary | ICD-10-CM

## 2022-11-13 DIAGNOSIS — I10 Essential (primary) hypertension: Secondary | ICD-10-CM

## 2022-11-13 DIAGNOSIS — M8580 Other specified disorders of bone density and structure, unspecified site: Secondary | ICD-10-CM

## 2022-11-13 DIAGNOSIS — L304 Erythema intertrigo: Secondary | ICD-10-CM

## 2022-11-13 DIAGNOSIS — Z114 Encounter for screening for human immunodeficiency virus [HIV]: Secondary | ICD-10-CM

## 2022-11-13 DIAGNOSIS — E559 Vitamin D deficiency, unspecified: Secondary | ICD-10-CM | POA: Insufficient documentation

## 2022-11-13 DIAGNOSIS — E66813 Obesity, class 3: Secondary | ICD-10-CM | POA: Insufficient documentation

## 2022-11-13 DIAGNOSIS — E785 Hyperlipidemia, unspecified: Secondary | ICD-10-CM

## 2022-11-13 HISTORY — DX: Encounter for immunization: Z23

## 2022-11-13 HISTORY — DX: Encounter for screening mammogram for malignant neoplasm of breast: Z12.31

## 2022-11-13 NOTE — Assessment & Plan Note (Signed)
>>  ASSESSMENT AND PLAN FOR HYPERTENSION WRITTEN ON 11/13/2022  2:30 PM BY WALSH, TANYA, MD  Well-controlled on current antihypertensive medications.  Asymptomatic. -Continue amlodipine  2.5 mg daily -Continue triamterene -hydrochlorothiazide 37.5-25 mg daily -Bmet at next visit -Follow-up in 6 months

## 2022-11-13 NOTE — Progress Notes (Signed)
   SUBJECTIVE:   Chief Complaint  Patient presents with   Transitions Of Care   HPI Patient presents to clinic to transfer care.  No acute concerns  Hypertension Asymptomatic.  Takes amlodipine 2.5 mg daily and triamterene-HCTZ 37.5-25 mg daily.  Tolerating medication well.  Anxiety Doing well.  Denies any SI/HI.  Taking Celexa 20 mg daily.  Tolerating medication well.    PERTINENT PMH / PSH: Hypertension Hyperlipidemia Hidradenitis suppurative   OBJECTIVE:  BP 118/80 (BP Location: Right Arm, Patient Position: Sitting, Cuff Size: Large)   Pulse 80   Temp 98.5 F (36.9 C) (Oral)   Ht 5\' 6"  (1.676 m)   Wt 288 lb 3.2 oz (130.7 kg)   LMP  (LMP Unknown)   SpO2 98%   BMI 46.52 kg/m    Physical Exam Constitutional:      General: She is not in acute distress.    Appearance: She is not ill-appearing.  HENT:     Head: Normocephalic.  Pulmonary:     Effort: Pulmonary effort is normal.  Neurological:     Mental Status: She is alert.  Psychiatric:        Mood and Affect: Mood normal.        Behavior: Behavior normal.        Thought Content: Thought content normal.        Judgment: Judgment normal.     ASSESSMENT/PLAN:  Primary hypertension Assessment & Plan: Well-controlled on current antihypertensive medications.  Asymptomatic. -Continue amlodipine 2.5 mg daily -Continue triamterene-hydrochlorothiazide 37.5-25 mg daily -Bmet at next visit -Follow-up in 6 months  Orders: -     CBC with Differential/Platelet; Future -     Comprehensive metabolic panel; Future  Breast cancer screening by mammogram -     3D Screening Mammogram, Left and Right; Future  Need for immunization against influenza -     Flu Vaccine QUAD 26mo+IM (Fluarix, Fluzone & Alfiuria Quad PF)  Intertrigo Assessment & Plan: Uses nystatin powder and cream as needed Continue current medications   Anxiety Assessment & Plan: Doing well. -Continue Celexa 20 mg daily.   Prediabetes -      Hemoglobin A1c; Future  Hyperlipidemia, unspecified hyperlipidemia type Assessment & Plan: On statin.  No myalgias -Continue atorvastatin 20 mg daily    Orders: -     Lipid panel; Future  Obesity, Class III, BMI 40-49.9 (morbid obesity) (HCC) -     Hemoglobin A1c; Future -     TSH; Future -     Vitamin B12; Future  Screening for HIV without presence of risk factors -     HIV Antibody (routine testing w rflx); Future  Osteopenia, unspecified location -     VITAMIN D 25 Hydroxy (Vit-D Deficiency, Fractures); Future     PDMP reviewed  Return for annual visit with fasting labs 1 week prior.  5mo, MD

## 2022-11-13 NOTE — Assessment & Plan Note (Addendum)
On statin.  No myalgias. Continue atorvastatin 20 mg daily 

## 2022-11-13 NOTE — Assessment & Plan Note (Signed)
Well-controlled on current antihypertensive medications.  Asymptomatic. -Continue amlodipine 2.5 mg daily -Continue triamterene-hydrochlorothiazide 37.5-25 mg daily -Bmet at next visit -Follow-up in 6 months

## 2022-11-13 NOTE — Assessment & Plan Note (Signed)
Uses nystatin powder and cream as needed Continue current medications

## 2022-11-13 NOTE — Assessment & Plan Note (Signed)
Doing well. -Continue Celexa 20 mg daily.

## 2022-11-13 NOTE — Patient Instructions (Addendum)
It was a pleasure meeting you today. Thank you for allowing me to take part in your health care.  Our goals for today as we discussed include:  For your blood pressure Your blood pressure is well-controlled Rx radial Continue Norvasc 2.5 mg at night Continue Maxide 37.5-25 mg daily  Please schedule an appointment to have your labs taken 1 week prior to your next visit.  Fast for at least 12 hours the night before.  You have received your annual flu vaccine today  Referral sent for Mammogram. Please call to schedule appointment. [facility] [address] [place], [place] [address] [phone]    If you have any questions or concerns, please do not hesitate to call the office at [phone].  I look forward to our next visit and until then take care and stay safe.  Regards,   Dana Allan, MD   [facility]

## 2022-12-09 ENCOUNTER — Other Ambulatory Visit: Payer: Self-pay | Admitting: Family Medicine

## 2022-12-10 LAB — COMPREHENSIVE METABOLIC PANEL
ALT: 16 IU/L (ref 0–32)
AST: 20 IU/L (ref 0–40)
Albumin/Globulin Ratio: 1.9 (ref 1.2–2.2)
Albumin: 4.4 g/dL (ref 3.8–4.9)
Alkaline Phosphatase: 63 IU/L (ref 44–121)
BUN/Creatinine Ratio: 16 (ref 9–23)
BUN: 12 mg/dL (ref 6–24)
Bilirubin Total: 0.5 mg/dL (ref 0.0–1.2)
CO2: 28 mmol/L (ref 20–29)
Calcium: 9.6 mg/dL (ref 8.7–10.2)
Chloride: 101 mmol/L (ref 96–106)
Creatinine, Ser: 0.74 mg/dL (ref 0.57–1.00)
Globulin, Total: 2.3 g/dL (ref 1.5–4.5)
Glucose: 99 mg/dL (ref 70–99)
Potassium: 3.5 mmol/L (ref 3.5–5.2)
Sodium: 142 mmol/L (ref 134–144)
Total Protein: 6.7 g/dL (ref 6.0–8.5)
eGFR: 94 mL/min/{1.73_m2} (ref >59)

## 2022-12-10 LAB — CBC WITH DIFFERENTIAL/PLATELET
Basophils Absolute: 0 10*3/uL (ref 0.0–0.2)
Basos: 1 %
EOS (ABSOLUTE): 0.3 10*3/uL (ref 0.0–0.4)
Eos: 5 %
Hematocrit: 41.4 % (ref 34.0–46.6)
Hemoglobin: 13.1 g/dL (ref 11.1–15.9)
Immature Grans (Abs): 0 10*3/uL (ref 0.0–0.1)
Immature Granulocytes: 0 %
Lymphocytes Absolute: 1.5 10*3/uL (ref 0.7–3.1)
Lymphs: 27 %
MCH: 26 pg — ABNORMAL LOW (ref 26.6–33.0)
MCHC: 31.6 g/dL (ref 31.5–35.7)
MCV: 82 fL (ref 79–97)
Monocytes Absolute: 0.4 10*3/uL (ref 0.1–0.9)
Monocytes: 8 %
Neutrophils Absolute: 3.3 10*3/uL (ref 1.4–7.0)
Neutrophils: 59 %
Platelets: 197 10*3/uL (ref 150–450)
RBC: 5.04 x10E6/uL (ref 3.77–5.28)
RDW: 14.6 % (ref 11.7–15.4)
WBC: 5.5 10*3/uL (ref 3.4–10.8)

## 2022-12-10 LAB — LIPID PANEL
Chol/HDL Ratio: 3.1 ratio (ref 0.0–4.4)
Cholesterol, Total: 166 mg/dL (ref 100–199)
HDL: 54 mg/dL (ref >39)
LDL Chol Calc (NIH): 99 mg/dL (ref 0–99)
Triglycerides: 64 mg/dL (ref 0–149)
VLDL Cholesterol Cal: 13 mg/dL (ref 5–40)

## 2022-12-10 LAB — TSH: TSH: 1.13 u[IU]/mL (ref 0.450–4.500)

## 2022-12-10 LAB — VITAMIN B12: Vitamin B-12: 881 pg/mL (ref 232–1245)

## 2022-12-10 LAB — HEMOGLOBIN A1C
Est. average glucose Bld gHb Est-mCnc: 126 mg/dL
Hgb A1c MFr Bld: 6 % — ABNORMAL HIGH (ref 4.8–5.6)

## 2022-12-10 LAB — VITAMIN D 25 HYDROXY (VIT D DEFICIENCY, FRACTURES): Vit D, 25-Hydroxy: 34.3 ng/mL (ref 30.0–100.0)

## 2022-12-18 ENCOUNTER — Ambulatory Visit (INDEPENDENT_AMBULATORY_CARE_PROVIDER_SITE_OTHER): Payer: 59 | Admitting: Family Medicine

## 2022-12-18 ENCOUNTER — Encounter: Payer: Self-pay | Admitting: Family Medicine

## 2022-12-18 VITALS — BP 124/74 | HR 79 | Temp 99.2°F | Ht 66.0 in | Wt 288.4 lb

## 2022-12-18 DIAGNOSIS — Z0001 Encounter for general adult medical examination with abnormal findings: Secondary | ICD-10-CM | POA: Diagnosis not present

## 2022-12-18 DIAGNOSIS — I10 Essential (primary) hypertension: Secondary | ICD-10-CM

## 2022-12-18 DIAGNOSIS — E785 Hyperlipidemia, unspecified: Secondary | ICD-10-CM

## 2022-12-18 NOTE — Progress Notes (Signed)
SUBJECTIVE:   Chief Complaint  Patient presents with   Annual Exam   HPI Patient presents for physical exam  No acute concerns.  Hypertension Asymptomatic.  Currently takes triamterene-HCTZ 37.5-25 mg daily and amlodipine 2.5 mg daily.  Tolerating medication well.  Denies any headaches, visual changes, chest pain, shortness of breath, abdominal pain, nausea/vomiting or lower extremity edema.  Hyperlipidemia Currently taking Lipitor 20 mg and tolerating medication well.  No muscle aches or pains.  Mood disorder Asymptomatic.  Denies any SI/HI.  Currently taking Celexa 20 mg daily and tolerating medication well.   PERTINENT PMH / PSH: Hypertension OSA on CPAP Mood disorder Class III obesity Hyperlipidemia   OBJECTIVE:  BP 124/74   Pulse 79   Temp 99.2 F (37.3 C)   Ht 5\' 6"  (1.676 m)   Wt 288 lb 6.4 oz (130.8 kg)   LMP  (LMP Unknown)   SpO2 98%   BMI 46.55 kg/m    Physical Exam Vitals reviewed.  Constitutional:      General: She is not in acute distress.    Appearance: She is not ill-appearing.  HENT:     Head: Normocephalic.     Nose: Nose normal.  Eyes:     Conjunctiva/sclera: Conjunctivae normal.  Neck:     Thyroid: No thyroid mass, thyromegaly or thyroid tenderness.  Cardiovascular:     Rate and Rhythm: Normal rate and regular rhythm.     Heart sounds: Normal heart sounds.  Pulmonary:     Effort: Pulmonary effort is normal.     Breath sounds: Normal breath sounds.  Abdominal:     General: Abdomen is flat. Bowel sounds are normal.     Palpations: Abdomen is soft.  Musculoskeletal:        General: Normal range of motion.     Cervical back: Normal range of motion.  Neurological:     Mental Status: She is alert and oriented to person, place, and time. Mental status is at baseline.  Psychiatric:        Mood and Affect: Mood normal.        Behavior: Behavior normal.        Thought Content: Thought content normal.        Judgment: Judgment normal.      ASSESSMENT/PLAN:  Encounter for preventative adult health care exam with abnormal findings Assessment & Plan: 56 year old female with history of hypertension, hyperlipidemia and obesity class III. A1c today 6.0.  Recommend diet and exercise. Mammogram up-to-date January/2024 Colonoscopy up-to-date due 2025 Pap up-to-date due 2025 DEXA at age 1 Shingles up-to-date Declined COVID-vaccine. Follow-up 1 year for continued annual preventative care.   Obesity, Class III, BMI 40-49.9 (morbid obesity) (HCC) Assessment & Plan: Chronic.  Progressive.  BMI greater than 40. A1c 6.0, prediabetes Encouraged diet and exercise Would benefit Zepbound in future if no thyroid disease.  Orders: -     Hemoglobin A1c -     TSH -     VITAMIN D 25 Hydroxy (Vit-D Deficiency, Fractures) -     Vitamin B12 -     CBC with Differential/Platelet  Primary hypertension Assessment & Plan: Chronic.  Stable.  Well-controlled on current antihypertensive medications.  Asymptomatic.  Creatinine within normal limits -Continue amlodipine 2.5 mg daily -Continue triamterene-hydrochlorothiazide 37.5-25 mg daily   Orders: -     Comprehensive metabolic panel  Hyperlipidemia, unspecified hyperlipidemia type Assessment & Plan: On statin.  No myalgias.  Recent LDL less than 100. -Continue atorvastatin 20 mg daily  Orders: -     Lipid panel   PDMP reviewed  Return in about 1 year (around 12/19/2023) for annual visit with fasting labs 1 week prior.  Dana Allan, MD

## 2022-12-18 NOTE — Patient Instructions (Addendum)
It was a pleasure meeting you today. Thank you for allowing me to take part in your health care.  Our goals for today as we discussed include:  Your A1c was 6.0 today.  Recommend monitoring diet and increasing activity.  [facility] with [facility] for your sleep apnea equipment.  Let me know if you need a referral sent to someone in your network.  Your blood pressure is at goal today. Continue amlodipine 2.5 mg daily Continue Maxide 37.5-25 mg daily. Monitor blood pressure at home.  Goal less than 130/80.  Please schedule appointment for your next annual in 1 year with fasting lab appointment 1 week prior to your next visit.   Mammogram scheduled for January 2024 Colonoscopy and Pap smear due in 2025 Tetanus booster due in 2028.  If you have any questions or concerns, please do not hesitate to call the office at [phone].  I look forward to our next visit and until then take care and stay safe.  Regards,   Dana Allan, MD   [facility]

## 2022-12-19 ENCOUNTER — Encounter: Payer: Self-pay | Admitting: Family Medicine

## 2022-12-30 ENCOUNTER — Encounter: Payer: Self-pay | Admitting: Family Medicine

## 2022-12-30 DIAGNOSIS — Z0001 Encounter for general adult medical examination with abnormal findings: Secondary | ICD-10-CM | POA: Insufficient documentation

## 2022-12-30 NOTE — Assessment & Plan Note (Signed)
On statin.  No myalgias.  Recent LDL less than 100. -Continue atorvastatin 20 mg daily

## 2022-12-30 NOTE — Assessment & Plan Note (Addendum)
Chronic.  Progressive.  BMI greater than 40. A1c 6.0, prediabetes Encouraged diet and exercise Would benefit Zepbound in future if no thyroid disease.

## 2022-12-30 NOTE — Assessment & Plan Note (Signed)
>>  ASSESSMENT AND PLAN FOR HYPERTENSION WRITTEN ON 12/30/2022  5:49 PM BY WALSH, TANYA, MD  Chronic.  Stable.  Well-controlled on current antihypertensive medications.  Asymptomatic.  Creatinine within normal limits -Continue amlodipine  2.5 mg daily -Continue triamterene -hydrochlorothiazide 37.5-25 mg daily

## 2022-12-30 NOTE — Assessment & Plan Note (Signed)
Chronic.  Stable.  Well-controlled on current antihypertensive medications.  Asymptomatic.  Creatinine within normal limits -Continue amlodipine 2.5 mg daily -Continue triamterene-hydrochlorothiazide 37.5-25 mg daily

## 2022-12-30 NOTE — Assessment & Plan Note (Addendum)
57 year old female with history of hypertension, hyperlipidemia and obesity class III. A1c today 6.0.  Recommend diet and exercise. Mammogram up-to-date January/2024 Colonoscopy up-to-date due 2025 Pap up-to-date due 2025 DEXA at age 71 Shingles up-to-date Declined COVID-vaccine. Follow-up 1 year for continued annual preventative care.

## 2023-01-03 ENCOUNTER — Ambulatory Visit
Admission: RE | Admit: 2023-01-03 | Discharge: 2023-01-03 | Disposition: A | Discharge status: Home / Self Care | Payer: 59 | Source: Ambulatory Visit | Attending: Family Medicine | Admitting: Family Medicine

## 2023-01-03 DIAGNOSIS — Z1231 Encounter for screening mammogram for malignant neoplasm of breast: Secondary | ICD-10-CM | POA: Diagnosis not present

## 2023-02-19 ENCOUNTER — Other Ambulatory Visit: Payer: Self-pay

## 2023-02-19 DIAGNOSIS — E7800 Pure hypercholesterolemia, unspecified: Secondary | ICD-10-CM

## 2023-02-19 MED ORDER — ATORVASTATIN CALCIUM 20 MG PO TABS: 20.0000 mg | ORAL_TABLET | Freq: Every day | ORAL | 3 refills | Status: DC

## 2023-03-06 ENCOUNTER — Other Ambulatory Visit: Payer: Self-pay

## 2023-03-06 DIAGNOSIS — I10 Essential (primary) hypertension: Secondary | ICD-10-CM

## 2023-03-06 MED ORDER — TRIAMTERENE-HCTZ 37.5-25 MG PO TABS: 1.0000 | ORAL_TABLET | Freq: Every day | ORAL | 1 refills | Status: DC

## 2023-03-10 ENCOUNTER — Other Ambulatory Visit: Payer: Self-pay | Admitting: Family

## 2023-03-10 DIAGNOSIS — F419 Anxiety disorder, unspecified: Secondary | ICD-10-CM

## 2023-04-10 NOTE — Telephone Encounter (Signed)
Prescription Request  04/10/2023  LOV: Visit date not found  What is the name of the medication or equipment? citalopram (CELEXA) 20 MG tablet  Have you contacted your pharmacy to request a refill? Yes   Which pharmacy would you like this sent to?   [facility]) [place], [place] - [address] [facility] [address] [place] [address] [place] [address] Phone: [phone] Fax: [phone]      Patient notified that their request is being sent to the clinical staff for review and that they should receive a response within 2 business days.   Please advise at Mobile [phone] (mobile)

## 2023-04-14 ENCOUNTER — Telehealth: Payer: Self-pay | Admitting: *Deleted

## 2023-04-14 DIAGNOSIS — F419 Anxiety disorder, unspecified: Secondary | ICD-10-CM

## 2023-04-14 MED ORDER — CITALOPRAM HYDROBROMIDE 20 MG PO TABS: 20.0000 mg | ORAL_TABLET | Freq: Every day | ORAL | 3 refills | Status: DC

## 2023-04-14 NOTE — Telephone Encounter (Signed)
LOV: 12/18/22  NOV: 12/22/23  Last filled by Worthy Rancher, NP

## 2023-04-15 NOTE — Telephone Encounter (Signed)
Pt need a refill on citalopram [facility] to [facility] Order# [id number]

## 2023-04-15 NOTE — Telephone Encounter (Signed)
Refill sent on 04/14/23 by Thurmond Butts

## 2023-04-16 ENCOUNTER — Other Ambulatory Visit: Payer: Self-pay | Admitting: Family Medicine

## 2023-04-16 DIAGNOSIS — F419 Anxiety disorder, unspecified: Secondary | ICD-10-CM

## 2023-04-16 MED ORDER — CITALOPRAM HYDROBROMIDE 20 MG PO TABS: 20.0000 mg | ORAL_TABLET | Freq: Every day | ORAL | 3 refills | Status: DC

## 2023-04-16 NOTE — Telephone Encounter (Signed)
Pt called back in regards of previous message. As per pt, she's getting furious now because she really needs her meds and she does not know what's going on?? As per pt, this need to be done asap because she's out of her meds.Marland Kitchen

## 2023-06-23 ENCOUNTER — Other Ambulatory Visit: Payer: Self-pay

## 2023-06-23 DIAGNOSIS — I10 Essential (primary) hypertension: Secondary | ICD-10-CM

## 2023-06-23 MED ORDER — AMLODIPINE BESYLATE 2.5 MG PO TABS: 2.5000 mg | ORAL_TABLET | Freq: Every day | ORAL | 3 refills | Status: DC

## 2023-06-23 NOTE — Telephone Encounter (Signed)
Last filled by [facility]

## 2023-08-16 ENCOUNTER — Other Ambulatory Visit: Payer: Self-pay | Admitting: Family Medicine

## 2023-08-16 DIAGNOSIS — I10 Essential (primary) hypertension: Secondary | ICD-10-CM

## 2023-12-17 LAB — COMPREHENSIVE METABOLIC PANEL
ALT: 14 [IU]/L (ref 0–32)
AST: 17 [IU]/L (ref 0–40)
Albumin: 4.3 g/dL (ref 3.8–4.9)
Alkaline Phosphatase: 64 [IU]/L (ref 44–121)
BUN/Creatinine Ratio: 14 (ref 9–23)
BUN: 10 mg/dL (ref 6–24)
Bilirubin Total: 0.4 mg/dL (ref 0.0–1.2)
CO2: 25 mmol/L (ref 20–29)
Calcium: 9.9 mg/dL (ref 8.7–10.2)
Chloride: 103 mmol/L (ref 96–106)
Creatinine, Ser: 0.71 mg/dL (ref 0.57–1.00)
Globulin, Total: 2.6 g/dL (ref 1.5–4.5)
Glucose: 95 mg/dL (ref 70–99)
Potassium: 4.1 mmol/L (ref 3.5–5.2)
Sodium: 145 mmol/L — ABNORMAL HIGH (ref 134–144)
Total Protein: 6.9 g/dL (ref 6.0–8.5)
eGFR: 98 mL/min/{1.73_m2} (ref >59)

## 2023-12-17 LAB — VITAMIN D 25 HYDROXY (VIT D DEFICIENCY, FRACTURES): Vit D, 25-Hydroxy: 38.8 ng/mL (ref 30.0–100.0)

## 2023-12-17 LAB — CBC WITH DIFFERENTIAL/PLATELET
Basophils Absolute: 0.1 10*3/uL (ref 0.0–0.2)
Basos: 1 %
EOS (ABSOLUTE): 0.4 10*3/uL (ref 0.0–0.4)
Eos: 5 %
Hematocrit: 41.7 % (ref 34.0–46.6)
Hemoglobin: 12.9 g/dL (ref 11.1–15.9)
Immature Grans (Abs): 0 10*3/uL (ref 0.0–0.1)
Immature Granulocytes: 0 %
Lymphocytes Absolute: 2 10*3/uL (ref 0.7–3.1)
Lymphs: 29 %
MCH: 26 pg — ABNORMAL LOW (ref 26.6–33.0)
MCHC: 30.9 g/dL — ABNORMAL LOW (ref 31.5–35.7)
MCV: 84 fL (ref 79–97)
Monocytes Absolute: 0.6 10*3/uL (ref 0.1–0.9)
Monocytes: 9 %
Neutrophils Absolute: 3.8 10*3/uL (ref 1.4–7.0)
Neutrophils: 56 %
Platelets: 208 10*3/uL (ref 150–450)
RBC: 4.97 x10E6/uL (ref 3.77–5.28)
RDW: 14.2 % (ref 11.7–15.4)
WBC: 6.8 10*3/uL (ref 3.4–10.8)

## 2023-12-17 LAB — LIPID PANEL
Chol/HDL Ratio: 3.3 {ratio} (ref 0.0–4.4)
Cholesterol, Total: 161 mg/dL (ref 100–199)
HDL: 49 mg/dL (ref >39)
LDL Chol Calc (NIH): 97 mg/dL (ref 0–99)
Triglycerides: 76 mg/dL (ref 0–149)
VLDL Cholesterol Cal: 15 mg/dL (ref 5–40)

## 2023-12-17 LAB — TSH: TSH: 0.954 u[IU]/mL (ref 0.450–4.500)

## 2023-12-17 LAB — VITAMIN B12: Vitamin B-12: 990 pg/mL (ref 232–1245)

## 2023-12-17 LAB — HEMOGLOBIN A1C
Est. average glucose Bld gHb Est-mCnc: 128 mg/dL
Hgb A1c MFr Bld: 6.1 % — ABNORMAL HIGH (ref 4.8–5.6)

## 2023-12-21 ENCOUNTER — Encounter: Payer: Self-pay | Admitting: Family Medicine

## 2023-12-21 NOTE — Patient Instructions (Incomplete)
It was a pleasure meeting you today. Thank you for allowing me to take part in your health care.  Our goals for today as we discussed include:  Refills sent for requested medications  Schedule lab appointment for repeat blood work in 2-4 weeks.  No fasting required to recheck sodium level.  Flu vaccine given today  Pap due in Sept 2025 Colonoscopy due Sept 2025.  Referral sent.  This is a list of the screening recommended for you and due dates:  Health Maintenance  Topic Date Due   COVID-19 Vaccine (3 - 2024-25 season) 08/24/2023   Mammogram  01/04/2024   Colon Cancer Screening  08/23/2024   Pap with HPV screening  09/13/2024   DTaP/Tdap/Td vaccine (2 - Td or Tdap) 09/03/2027   Flu Shot  Completed   Hepatitis C Screening  Completed   HIV Screening  Completed   Zoster (Shingles) Vaccine  Completed   HPV Vaccine  Aged Out    Follow up as needed   If you have any questions or concerns, please do not hesitate to call the office at [phone].  I look forward to our next visit and until then take care and stay safe.  Regards,   Dana Allan, MD   [facility]

## 2023-12-21 NOTE — Progress Notes (Unsigned)
SUBJECTIVE:   Chief Complaint  Patient presents with   Annual Exam   HPI Presents for annual visit  Discussed the use of AI scribe software for clinical note transcription with the patient, who gave verbal consent to proceed.  History of Present Illness The patient, with a history of hypertension and prediabetes, presents for a routine physical examination. She reports no new health concerns and has been adhering to her prescribed medication regimen, including blood pressure medications. The patient does not monitor her blood pressure at home but reports no symptoms of chest pain, shortness of breath, or visual changes.  The patient's recent labs showed slightly elevated sodium levels, for which she was advised to increase her water intake. She is also on a diuretic, and the provider expressed concern about potential dehydration and associated symptoms such as dizziness and confusion if sodium levels continue to rise.  The patient's A1c remains at 6.1, indicating prediabetes. The patient's triglycerides were within the normal range, and she is currently on Lipitor for cholesterol management.  She reports no gastrointestinal issues, chest pain, shortness of breath, or swelling in the legs.  The patient had a hysterectomy due to fibroids but still has her cervix. She could not recall the date of her last Pap smear, and the provider will check her records to determine if a new one is needed. The patient has received the hepatitis B vaccine in the past and is up to date on other vaccines, including the flu shot.  The patient also mentioned a past issue with skin irritation under her abdomen, which she has been managing with Gold Bond powder and cotton balls to keep the area dry.    Review of Systems - Negative except listed above    PERTINENT PMH / PSH: As above  OBJECTIVE:  BP 126/68   Pulse 70   Temp 98.2 F (36.8 C)   Resp 18   Ht 5\' 6"  (1.676 m)   Wt 295 lb 2 oz (133.9 kg)    LMP  (LMP Unknown)   SpO2 99%   BMI 47.63 kg/m    Physical Exam Vitals reviewed.  Constitutional:      General: She is not in acute distress.    Appearance: She is obese. She is not ill-appearing.  HENT:     Head: Normocephalic.     Right Ear: Tympanic membrane, ear canal and external ear normal.     Left Ear: Tympanic membrane, ear canal and external ear normal.     Nose: Nose normal.     Mouth/Throat:     Mouth: Mucous membranes are moist.  Eyes:     Extraocular Movements: Extraocular movements intact.     Conjunctiva/sclera: Conjunctivae normal.     Pupils: Pupils are equal, round, and reactive to light.  Neck:     Thyroid: No thyromegaly or thyroid tenderness.     Vascular: No carotid bruit.  Cardiovascular:     Rate and Rhythm: Normal rate and regular rhythm.     Pulses: Normal pulses.     Heart sounds: Normal heart sounds.  Pulmonary:     Effort: Pulmonary effort is normal.     Breath sounds: Normal breath sounds.  Abdominal:     General: Bowel sounds are normal. There is no distension.     Palpations: Abdomen is soft.     Tenderness: There is no abdominal tenderness. There is no right CVA tenderness, left CVA tenderness, guarding or rebound.  Musculoskeletal:  General: Normal range of motion.     Cervical back: Normal range of motion.     Right lower leg: No edema.     Left lower leg: No edema.  Lymphadenopathy:     Cervical: No cervical adenopathy.  Skin:    Capillary Refill: Capillary refill takes less than 2 seconds.  Neurological:     General: No focal deficit present.     Mental Status: She is alert and oriented to person, place, and time. Mental status is at baseline.     Motor: No weakness.  Psychiatric:        Mood and Affect: Mood normal.        Behavior: Behavior normal.        Thought Content: Thought content normal.        Judgment: Judgment normal.        12/22/2023    8:47 AM 12/18/2022    1:41 PM 10/23/2022    4:08 PM 06/05/2022     9:37 AM 12/18/2021    3:41 PM  Depression screen PHQ 2/9  Decreased Interest 0 0 0 0 0  Down, Depressed, Hopeless 0 0 0 0 0  PHQ - 2 Score 0 0 0 0 0  Altered sleeping 0 0     Tired, decreased energy 0 0     Change in appetite 0 0     Feeling bad or failure about yourself  0 0     Trouble concentrating 0 0     Moving slowly or fidgety/restless 0 0     Suicidal thoughts 0 0     PHQ-9 Score 0 0     Difficult doing work/chores Not difficult at all Not difficult at all         12/22/2023    8:48 AM 12/18/2022    1:42 PM 09/20/2020    8:35 AM 03/17/2020    9:09 AM  GAD 7 : Generalized Anxiety Score  Nervous, Anxious, on Edge 0 0 0 0  Control/stop worrying 0 0 0 0  Worry too much - different things 0 0 0 0  Trouble relaxing 0 0 0 0  Restless 0 0 0 0  Easily annoyed or irritable 0 0 0 0  Afraid - awful might happen 0 0 0 0  Total GAD 7 Score 0 0 0 0  Anxiety Difficulty Not difficult at all Not difficult at all Not difficult at all Not difficult at all    ASSESSMENT/PLAN:  Annual physical exam Assessment & Plan: -Continue annual mammograms. Up to date.  Order placed for upcoming year -Recommend regular self breast exams -Colonoscopy up to date. Due 08/2024.  Referral placed for upcoming screening -Flu vaccine received today -Hepatitis B vaccine up to date. -Pneumonia vaccine not needed until age 60. -Tetanus and shingles vaccines up to date.    Mood disorder (HCC) Assessment & Plan: Managed on current medication -Refill Celexa 20 mg daily  Orders: -     Citalopram Hydrobromide; Take 1 tablet (20 mg total) by mouth daily.  Dispense: 90 tablet; Refill: 3  Need for influenza vaccination -     Flu vaccine trivalent PF, 6mos and older(Flulaval,Afluria,Fluarix,Fluzone)  Breast cancer screening by mammogram -     3D Screening Mammogram, Left and Right; Future  Serum sodium elevated Assessment & Plan: Sodium slightly elevated at 145. Patient was fasting at the time of the  test. -Increase water intake to prevent dehydration. -Repeat electrolyte panel in 2-4 weeks to ensure  sodium levels are decreasing.  Orders: -     Basic metabolic panel; Future  Colon cancer screening -     Ambulatory referral to Gastroenterology  Hyperlipidemia, unspecified hyperlipidemia type Assessment & Plan: Triglycerides slightly elevated at 97. -Continue current diet and exercise regimen. -Consider increasing Lipitor or switching to Crestor if levels increase above 100. -Refill atorvastatin 20 mg daily    Orders: -     Atorvastatin Calcium; Take 1 tablet (20 mg total) by mouth at bedtime.  Dispense: 90 tablet; Refill: 3  Primary hypertension Assessment & Plan: Well controlled on current medication regimen. No symptoms of chest pain, shortness of breath, or visual changes. -Refill Amlodipine 2.5 mg daily -Refill Maxzide-25 daily -Monitor BP at home, goal <150/90  Orders: -     amLODIPine Besylate; Take 1 tablet (2.5 mg total) by mouth daily.  Dispense: 90 tablet; Refill: 3 -     Triamterene-HCTZ; Take 1 tablet by mouth daily.  Dispense: 90 tablet; Refill: 3  Intertrigo Assessment & Plan: Uses nystatin powder and Gold bond powder as needed Continue current medications   Prediabetes Assessment & Plan: A1c stable at 6.1. No significant change. -Continue lifestyle modifications including diet and exercise. -Reevaluate yearly   Osteopenia, unspecified location Assessment & Plan: Vitamin D levels on the lower end of normal. -Start over-the-counter Vitamin D supplement at 400 international units daily.    PDMP reviewed  Return if symptoms worsen or fail to improve, for PCP.  Dana Allan, MD

## 2023-12-22 ENCOUNTER — Encounter: Payer: Self-pay | Admitting: Family Medicine

## 2023-12-22 ENCOUNTER — Ambulatory Visit (INDEPENDENT_AMBULATORY_CARE_PROVIDER_SITE_OTHER): Payer: 59 | Admitting: Family Medicine

## 2023-12-22 VITALS — BP 126/68 | HR 70 | Temp 98.2°F | Resp 18 | Ht 66.0 in | Wt 295.1 lb

## 2023-12-22 DIAGNOSIS — Z0000 Encounter for general adult medical examination without abnormal findings: Secondary | ICD-10-CM

## 2023-12-22 DIAGNOSIS — E870 Hyperosmolality and hypernatremia: Secondary | ICD-10-CM | POA: Insufficient documentation

## 2023-12-22 DIAGNOSIS — L304 Erythema intertrigo: Secondary | ICD-10-CM

## 2023-12-22 DIAGNOSIS — F39 Unspecified mood [affective] disorder: Secondary | ICD-10-CM | POA: Diagnosis not present

## 2023-12-22 DIAGNOSIS — E785 Hyperlipidemia, unspecified: Secondary | ICD-10-CM

## 2023-12-22 DIAGNOSIS — Z1231 Encounter for screening mammogram for malignant neoplasm of breast: Secondary | ICD-10-CM | POA: Insufficient documentation

## 2023-12-22 DIAGNOSIS — Z23 Encounter for immunization: Secondary | ICD-10-CM | POA: Diagnosis not present

## 2023-12-22 DIAGNOSIS — M8580 Other specified disorders of bone density and structure, unspecified site: Secondary | ICD-10-CM

## 2023-12-22 DIAGNOSIS — I10 Essential (primary) hypertension: Secondary | ICD-10-CM | POA: Insufficient documentation

## 2023-12-22 DIAGNOSIS — Z1211 Encounter for screening for malignant neoplasm of colon: Secondary | ICD-10-CM

## 2023-12-22 DIAGNOSIS — R7303 Prediabetes: Secondary | ICD-10-CM

## 2023-12-22 DIAGNOSIS — E7800 Pure hypercholesterolemia, unspecified: Secondary | ICD-10-CM

## 2023-12-22 HISTORY — DX: Encounter for screening for malignant neoplasm of colon: Z12.11

## 2023-12-22 MED ORDER — ATORVASTATIN CALCIUM 20 MG PO TABS: 20.0000 mg | ORAL_TABLET | Freq: Every day | ORAL | 3 refills | Status: DC

## 2023-12-22 MED ORDER — CITALOPRAM HYDROBROMIDE 20 MG PO TABS: 20.0000 mg | ORAL_TABLET | Freq: Every day | ORAL | 3 refills | Status: DC

## 2023-12-22 MED ORDER — AMLODIPINE BESYLATE 2.5 MG PO TABS: 2.5000 mg | ORAL_TABLET | Freq: Every day | ORAL | 3 refills | Status: DC

## 2023-12-22 MED ORDER — TRIAMTERENE-HCTZ 37.5-25 MG PO TABS: 1.0000 | ORAL_TABLET | Freq: Every day | ORAL | 3 refills | Status: DC

## 2023-12-22 NOTE — Assessment & Plan Note (Signed)
Vitamin D levels on the lower end of normal. -Start over-the-counter Vitamin D supplement at 400 international units daily.

## 2023-12-22 NOTE — Assessment & Plan Note (Signed)
>>  ASSESSMENT AND PLAN FOR HYPERTENSION WRITTEN ON 12/22/2023  9:48 AM BY WALSH, TANYA, MD  Well controlled on current medication regimen. No symptoms of chest pain, shortness of breath, or visual changes. -Refill Amlodipine  2.5 mg daily -Refill Maxzide-25 daily -Monitor BP at home, goal <150/90

## 2023-12-22 NOTE — Addendum Note (Signed)
Addended by: Enid Cutter on: 12/22/2023 09:59 AM   Modules accepted: Orders

## 2023-12-22 NOTE — Assessment & Plan Note (Signed)
A1c stable at 6.1. No significant change. -Continue lifestyle modifications including diet and exercise. -Reevaluate yearly

## 2023-12-22 NOTE — Assessment & Plan Note (Addendum)
Sodium slightly elevated at 145. Patient was fasting at the time of the test. -Increase water intake to prevent dehydration. -Repeat electrolyte panel in 2-4 weeks to ensure sodium levels are decreasing.

## 2023-12-22 NOTE — Assessment & Plan Note (Signed)
Well controlled on current medication regimen. No symptoms of chest pain, shortness of breath, or visual changes. -Refill Amlodipine 2.5 mg daily -Refill Maxzide-25 daily -Monitor BP at home, goal <150/90

## 2023-12-22 NOTE — Assessment & Plan Note (Signed)
Managed on current medication -Refill Celexa 20 mg daily

## 2023-12-22 NOTE — Assessment & Plan Note (Signed)
Uses nystatin powder and Gold bond powder as needed Continue current medications

## 2023-12-22 NOTE — Assessment & Plan Note (Signed)
-  Continue annual mammograms. Up to date.  Order placed for upcoming year -Recommend regular self breast exams -Colonoscopy up to date. Due 08/2024.  Referral placed for upcoming screening -Flu vaccine received today -Hepatitis B vaccine up to date. -Pneumonia vaccine not needed until age 59. -Tetanus and shingles vaccines up to date.

## 2023-12-22 NOTE — Assessment & Plan Note (Signed)
Triglycerides slightly elevated at 97. -Continue current diet and exercise regimen. -Consider increasing Lipitor or switching to Crestor if levels increase above 100. -Refill atorvastatin 20 mg daily

## 2023-12-23 ENCOUNTER — Telehealth: Payer: Self-pay

## 2023-12-23 NOTE — Telephone Encounter (Signed)
 Contacted patient to let her know her colonoscopy referral has been received, however its too soon to schedule her colonoscopy.  She has been advised that I will call her back in August 2025 since she is not due until Sept 2025. Referral will be closed and reopened in August.  Thanks,  [place], CMA

## 2024-01-16 ENCOUNTER — Ambulatory Visit
Admission: RE | Admit: 2024-01-16 | Discharge: 2024-01-16 | Disposition: A | Discharge status: Home / Self Care | Payer: 59 | Source: Ambulatory Visit | Attending: Family Medicine | Admitting: Family Medicine

## 2024-01-16 DIAGNOSIS — Z1231 Encounter for screening mammogram for malignant neoplasm of breast: Secondary | ICD-10-CM | POA: Insufficient documentation

## 2024-02-04 ENCOUNTER — Encounter: Payer: Self-pay | Admitting: Family Medicine

## 2024-06-09 ENCOUNTER — Telehealth: Payer: Self-pay

## 2024-06-09 NOTE — Telephone Encounter (Signed)
 Copied from CRM [id number]. Topic: Clinical - Medical Advice >> Jun 09, 2024  3:04 PM Christine Michael wrote: Reason for CRM: Pt is on CPAP Machine to get refill on mask and they told her that her certificate ran out and they need an order to renew.  [facility] is the company/ She is going to call them and give them our fax to send over the request for the order but wanted us  to be aware.

## 2024-06-11 NOTE — Telephone Encounter (Signed)
 Fax had been received, signed, and faxed back.

## 2024-07-26 ENCOUNTER — Telehealth: Payer: Self-pay

## 2024-07-26 ENCOUNTER — Other Ambulatory Visit: Payer: Self-pay

## 2024-07-26 DIAGNOSIS — Z1211 Encounter for screening for malignant neoplasm of colon: Secondary | ICD-10-CM

## 2024-07-26 MED ORDER — NA SULFATE-K SULFATE-MG SULF 17.5-3.13-1.6 GM/177ML PO SOLN: 1.0000 | Freq: Once | ORAL | 0 refills | Status: AC

## 2024-07-26 NOTE — Telephone Encounter (Signed)
 Gastroenterology Pre-Procedure Review  Request Date: 10/28/24 Requesting Physician: Dr. Jinny  PATIENT REVIEW QUESTIONS: The patient responded to the following health history questions as indicated:    1. Are you having any GI issues? no 2. Do you have a personal history of Polyps? no 3. Do you have a family history of Colon Cancer or Polyps? no 4. Diabetes Mellitus? no 5. Joint replacements in the past 12 months?no 6. Major health problems in the past 3 months?no 7. Any artificial heart valves, MVP, or defibrillator?no    MEDICATIONS & ALLERGIES:    Patient reports the following regarding taking any anticoagulation/antiplatelet therapy:   Plavix, Coumadin, Eliquis, Xarelto, Lovenox, Pradaxa, Brilinta, or Effient? no Aspirin? no  Patient confirms/reports the following medications:  Current Outpatient Medications  Medication Sig Dispense Refill   Na Sulfate-K Sulfate-Mg Sulfate concentrate (SUPREP) 17.5-3.13-1.6 GM/177ML SOLN Take 1 kit (354 mLs total) by mouth once for 1 dose. 354 mL 0   amLODipine  (NORVASC ) 2.5 MG tablet Take 1 tablet (2.5 mg total) by mouth daily. 90 tablet 3   atorvastatin  (LIPITOR) 20 MG tablet Take 1 tablet (20 mg total) by mouth at bedtime. 90 tablet 3   citalopram  (CELEXA ) 20 MG tablet Take 1 tablet (20 mg total) by mouth daily. 90 tablet 3   hydrocortisone  2.5 % ointment Apply topically 2 (two) times daily. 30 g 11   Multiple Vitamins-Minerals (WOMENS 50+ MULTI VITAMIN/MIN PO) Take by mouth.     nystatin  (MYCOSTATIN /NYSTOP ) powder Apply 1 application. topically 3 (three) times daily. Under belly as needed 60 g 11   nystatin  cream (MYCOSTATIN ) Apply 1 application. topically 2 (two) times daily. 30 g 11   triamterene -hydrochlorothiazide (MAXZIDE-25) 37.5-25 MG tablet Take 1 tablet by mouth daily. 90 tablet 3   No current facility-administered medications for this visit.    Patient confirms/reports the following allergies:  No Known Allergies  No orders of  the defined types were placed in this encounter.   AUTHORIZATION INFORMATION Primary Insurance: 1D#: Group #:  Secondary Insurance: 1D#: Group #:  SCHEDULE INFORMATION: Date: 10/28/24 Time: Location: [facility]

## 2024-08-10 ENCOUNTER — Ambulatory Visit: Payer: Self-pay

## 2024-08-10 NOTE — Telephone Encounter (Signed)
  FYI Only or Action Required?: Action required by provider: request for appointment.  Patient was last seen in primary care on 12/22/2023 by Hope Merle, MD.  Called Nurse Triage reporting Wheezing.  Symptoms began several days ago.  Interventions attempted: OTC medications: Nyquil Day and Night, Zarbees Honey and Other: elevated pillows.  Symptoms are: unchanged.  Triage Disposition: See PCP When Office is Open (Within 3 Days)  Patient/caregiver understands and will follow disposition?: Yes      Copied from CRM #[id number]. Topic: Clinical - Red Word Triage >> Aug 10, 2024  1:42 PM Shereese L wrote: [facility] that prompted transfer to Nurse Triage: Fluid on chest, extensive cough and unable to sleep, wheezing Reason for Disposition  Lots of coughing  Answer Assessment - Initial Assessment Questions Appointment made, advised urgent care/ED if symptoms worsen.  Denies dizziness, weakness, shortness of breath, headache, feeling faint. Patient reports chest congestion and statesyes I can hear wheezing, no audible wheezing noted. No available same day appointments, Nurse judgement to schedule appointment in clinic, tomorrow with care instructions to UC/ED. Patient verbalized understanding.  1. LOCATION: Where does it hurt?      Chest fluid, denies chest pain, sore from coughing in sides 2. ONSET: When did the sinus pain start?  (e.g., hours, days)      Thursday; no sinus pain or congestion, no nasal congestion 3. SEVERITY: How bad is the pain?   (Scale 0-10; or none, mild, moderate or severe)     3/10 of congestion 4. NASAL DISCHARGE: Do you have discharge from your nose? If so ask, What color?     white 5. FEVER: Do you have a fever? If Yes, ask: What is it, how was it measured, and when did it start?      no 6. OTHER SYMPTOMS: Do you have any other symptoms? (e.g., sore throat, cough, earache, difficulty breathing)      Denies sore throat, earache.  Denies  hx cardiac, chf, lungs; no ashtma  Protocols used: Sinus Pain or Congestion-A-AH

## 2024-08-11 ENCOUNTER — Ambulatory Visit

## 2024-08-11 ENCOUNTER — Ambulatory Visit: Admitting: Internal Medicine

## 2024-08-11 VITALS — BP 136/78 | HR 78 | Ht 66.0 in | Wt 286.4 lb

## 2024-08-11 DIAGNOSIS — E870 Hyperosmolality and hypernatremia: Secondary | ICD-10-CM | POA: Diagnosis not present

## 2024-08-11 DIAGNOSIS — R051 Acute cough: Secondary | ICD-10-CM | POA: Diagnosis not present

## 2024-08-11 DIAGNOSIS — J189 Pneumonia, unspecified organism: Secondary | ICD-10-CM | POA: Diagnosis not present

## 2024-08-11 MED ORDER — PREDNISONE 10 MG PO TABS: ORAL_TABLET | ORAL | 0 refills | Status: DC

## 2024-08-11 MED ORDER — ALBUTEROL SULFATE HFA 108 (90 BASE) MCG/ACT IN AERS: 2.0000 | INHALATION_SPRAY | Freq: Four times a day (QID) | RESPIRATORY_TRACT | 11 refills | Status: DC | PRN

## 2024-08-11 MED ORDER — DOXYCYCLINE HYCLATE 100 MG PO TABS: 100.0000 mg | ORAL_TABLET | Freq: Two times a day (BID) | ORAL | 0 refills | Status: DC

## 2024-08-11 NOTE — Progress Notes (Unsigned)
 Subjective:  Patient ID: Christine Michael, female    DOB: May 12, 1966  Age: 58 y.o. MRN: [id number]  CC: The primary encounter diagnosis was Acute cough. Diagnoses of Serum sodium elevated, Community acquired pneumonia of left lower lobe of lung, and Hypernatremia were also pertinent to this visit.   HPI Christine Michael presents for  Chief Complaint  Patient presents with   Cough    Chest congestion x 10 days   58 yr old female with h/o hypertension presents with productive cough x 10 days .   Sore throat started 10 days ago,  followed by malaise, body aches,  cough,   worse at night,  wheezing .  Her sinus drainage is clear. No pleurisy or shortness of breath   She has a history of tobacco abuse, having quit In 2009 after 10 years.     Outpatient Medications Prior to Visit  Medication Sig Dispense Refill   amLODipine  (NORVASC ) 2.5 MG tablet Take 1 tablet (2.5 mg total) by mouth daily. 90 tablet 3   atorvastatin  (LIPITOR) 20 MG tablet Take 1 tablet (20 mg total) by mouth at bedtime. 90 tablet 3   citalopram  (CELEXA ) 20 MG tablet Take 1 tablet (20 mg total) by mouth daily. 90 tablet 3   Multiple Vitamins-Minerals (WOMENS 50+ MULTI VITAMIN/MIN PO) Take by mouth.     triamterene -hydrochlorothiazide (MAXZIDE-25) 37.5-25 MG tablet Take 1 tablet by mouth daily. 90 tablet 3   hydrocortisone  2.5 % ointment Apply topically 2 (two) times daily. 30 g 11   nystatin  (MYCOSTATIN /NYSTOP ) powder Apply 1 application. topically 3 (three) times daily. Under belly as needed 60 g 11   nystatin  cream (MYCOSTATIN ) Apply 1 application. topically 2 (two) times daily. 30 g 11   No facility-administered medications prior to visit.    Review of Systems;  Patient denies headache, fevers, malaise, unintentional weight loss, skin rash, eye pain, sinus pain,  dysphagia,  hemoptysis ,  dyspnea, , chest pain, palpitations, orthopnea, edema, abdominal pain, nausea, melena, diarrhea, constipation, flank pain, dysuria,  hematuria, urinary  Frequency, nocturia, numbness, tingling, seizures,  Focal weakness, Loss of consciousness,  Tremor, insomnia, depression, anxiety, and suicidal ideation.      Objective:  BP 136/78   Pulse 78   Ht 5' 6 (1.676 m)   Wt 286 lb 6.4 oz (129.9 kg)   LMP  (LMP Unknown)   SpO2 98%   BMI 46.23 kg/m   BP Readings from Last 3 Encounters:  08/11/24 136/78  12/22/23 126/68  12/18/22 124/74    Wt Readings from Last 3 Encounters:  08/11/24 286 lb 6.4 oz (129.9 kg)  12/22/23 295 lb 2 oz (133.9 kg)  12/18/22 288 lb 6.4 oz (130.8 kg)    Physical Exam Vitals reviewed.  Constitutional:      General: She is not in acute distress.    Appearance: Normal appearance. She is normal weight. She is not ill-appearing, toxic-appearing or diaphoretic.  HENT:     Head: Normocephalic.     Right Ear: Tympanic membrane normal.     Left Ear: Tympanic membrane normal.     Nose: Nose normal.     Mouth/Throat:     Mouth: Mucous membranes are moist.     Pharynx: No oropharyngeal exudate.  Eyes:     General: No scleral icterus.       Right eye: No discharge.        Left eye: No discharge.     Conjunctiva/sclera: Conjunctivae normal.  Cardiovascular:  Rate and Rhythm: Normal rate and regular rhythm.     Heart sounds: Normal heart sounds.  Pulmonary:     Effort: Pulmonary effort is normal. No respiratory distress.     Breath sounds: Wheezing present.  Musculoskeletal:        General: Normal range of motion.  Skin:    General: Skin is warm and dry.  Neurological:     General: No focal deficit present.     Mental Status: She is alert and oriented to person, place, and time. Mental status is at baseline.  Psychiatric:        Mood and Affect: Mood normal.        Behavior: Behavior normal.        Thought Content: Thought content normal.        Judgment: Judgment normal.     Lab Results  Component Value Date   HGBA1C 6.1 (H) 12/16/2023   HGBA1C 6.0 (H) 12/09/2022   HGBA1C  6.2 (H) 10/18/2021    Lab Results  Component Value Date   CREATININE 0.74 08/11/2024   CREATININE 0.71 12/16/2023   CREATININE 0.74 12/09/2022    Lab Results  Component Value Date   WBC 6.8 12/16/2023   HGB 12.9 12/16/2023   HCT 41.7 12/16/2023   PLT 208 12/16/2023   GLUCOSE 86 08/11/2024   CHOL 161 12/16/2023   TRIG 76 12/16/2023   HDL 49 12/16/2023   LDLCALC 97 12/16/2023   ALT 14 12/16/2023   AST 17 12/16/2023   NA 139 08/11/2024   K 3.6 08/11/2024   CL 98 08/11/2024   CREATININE 0.74 08/11/2024   BUN 11 08/11/2024   CO2 23 08/11/2024   TSH 0.954 12/16/2023   HGBA1C 6.1 (H) 12/16/2023    MM 3D SCREENING MAMMOGRAM BILATERAL BREAST Result Date: 01/19/2024 CLINICAL DATA:  Screening. EXAM: DIGITAL SCREENING BILATERAL MAMMOGRAM WITH TOMOSYNTHESIS AND CAD TECHNIQUE: Bilateral screening digital craniocaudal and mediolateral oblique mammograms were obtained. Bilateral screening digital breast tomosynthesis was performed. The images were evaluated with computer-aided detection. COMPARISON:  Previous exam(s). ACR Breast Density Category b: There are scattered areas of fibroglandular density. FINDINGS: There are no findings suspicious for malignancy. IMPRESSION: No mammographic evidence of malignancy. A result letter of this screening mammogram will be mailed directly to the patient. RECOMMENDATION: Screening mammogram in one year. (Code:SM-B-01Y) BI-RADS CATEGORY  1: Negative. Electronically Signed   By: Dina  Arceo M.D.   On: 01/19/2024 12:22    Assessment & Plan:  .Acute cough -     DG Chest 2 View; Future -     Amoxicillin -Pot Clavulanate; Take 1 tablet by mouth 2 (two) times daily.  Dispense: 14 tablet; Refill: 0  Serum sodium elevated -     Basic metabolic panel with GFR  Community acquired pneumonia of left lower lobe of lung Assessment & Plan: Empiric therapy for COPD exacerbation started, with prednisone  and doxycycline ;  after reviewing her chest film I am adding  augmentin  for LLL infiltrate   Orders: -     Amoxicillin -Pot Clavulanate; Take 1 tablet by mouth 2 (two) times daily.  Dispense: 14 tablet; Refill: 0  Hypernatremia  Other orders -     predniSONE ; 6 tablets on Day 1 , then reduce by 1 tablet daily until gone  Dispense: 21 tablet; Refill: 0 -     Doxycycline  Hyclate; Take 1 tablet (100 mg total) by mouth 2 (two) times daily.  Dispense: 14 tablet; Refill: 0 -     Albuterol  Sulfate  HFA; Inhale 2 puffs into the lungs every 6 (six) hours as needed for wheezing or shortness of breath.  Dispense: 3.7 g; Refill: 11     I spent 34 minutes on the day of this face to face encounter reviewing patient's  most recent visit with cardiology,  nephrology,  and neurology,  prior relevant surgical and non surgical procedures, recent  labs and imaging studies, counseling on weight management,  reviewing the assessment and plan with patient, and post visit ordering and reviewing of  diagnostics and therapeutics with patient  .   Follow-up: No follow-ups on file.   Christine LITTIE Kettering, MD

## 2024-08-11 NOTE — Patient Instructions (Addendum)
 For your allergies,  I recommend trying Generic zyrtec ( cetirizine )  .  This  can be taken 1-2 times daily for allergies   Today I am prescribing you a prednisone  taper, a 7 day course of doxycycline  (antibiotic),   And albuterol  inhaler to use every 6 hours as needed for wheezing/chest tightness   You can use tylenol  PM,  robitussin or Delsym OTC for  cough  iF your chest x ay suggests pneumonia, I will add a 2nd antibiotic    Please take a probiotic ( Align, Floraque or Culturelle) of the generic version of one of these  For a minimum of 3 weeks to prevent a serious antibiotic associated diarrhea  Called clostridium dificile colitis

## 2024-08-12 ENCOUNTER — Ambulatory Visit: Payer: Self-pay | Admitting: Internal Medicine

## 2024-08-12 DIAGNOSIS — J189 Pneumonia, unspecified organism: Secondary | ICD-10-CM | POA: Insufficient documentation

## 2024-08-12 LAB — BASIC METABOLIC PANEL WITH GFR
BUN/Creatinine Ratio: 15 (ref 12–28)
BUN: 11 mg/dL (ref 8–27)
CO2: 23 mmol/L (ref 20–29)
Calcium: 9.8 mg/dL (ref 8.7–10.3)
Chloride: 98 mmol/L (ref 96–106)
Creatinine, Ser: 0.74 mg/dL (ref 0.57–1.00)
Glucose: 86 mg/dL (ref 70–99)
Potassium: 3.6 mmol/L (ref 3.5–5.2)
Sodium: 139 mmol/L (ref 134–144)
eGFR: 93 mL/min/1.73 (ref >59)

## 2024-08-12 MED ORDER — AMOXICILLIN-POT CLAVULANATE 875-125 MG PO TABS: 1.0000 | ORAL_TABLET | Freq: Two times a day (BID) | ORAL | 0 refills | Status: DC

## 2024-08-12 NOTE — Assessment & Plan Note (Signed)
 Empiric therapy for COPD exacerbation started, with prednisone  and doxycycline ;  after reviewing her chest film I am adding augmentin  for LLL infiltrate

## 2024-08-12 NOTE — Assessment & Plan Note (Signed)
 Resolved by repeat BMET

## 2024-08-13 NOTE — Telephone Encounter (Signed)
 Noted

## 2024-10-28 ENCOUNTER — Ambulatory Visit: Admitting: Anesthesiology

## 2024-10-28 ENCOUNTER — Encounter
Admission: RE | Disposition: A | Discharge status: Home / Self Care | Payer: Self-pay | Source: Home / Self Care | Attending: Gastroenterology

## 2024-10-28 ENCOUNTER — Encounter: Payer: Self-pay | Admitting: Gastroenterology

## 2024-10-28 ENCOUNTER — Ambulatory Visit
Admission: RE | Admit: 2024-10-28 | Discharge: 2024-10-28 | Disposition: A | Discharge status: Home / Self Care | Attending: Gastroenterology | Admitting: Gastroenterology

## 2024-10-28 DIAGNOSIS — K635 Polyp of colon: Secondary | ICD-10-CM | POA: Diagnosis not present

## 2024-10-28 DIAGNOSIS — I10 Essential (primary) hypertension: Secondary | ICD-10-CM | POA: Diagnosis not present

## 2024-10-28 DIAGNOSIS — Z1211 Encounter for screening for malignant neoplasm of colon: Secondary | ICD-10-CM

## 2024-10-28 DIAGNOSIS — D125 Benign neoplasm of sigmoid colon: Secondary | ICD-10-CM | POA: Diagnosis not present

## 2024-10-28 DIAGNOSIS — K640 First degree hemorrhoids: Secondary | ICD-10-CM | POA: Diagnosis not present

## 2024-10-28 DIAGNOSIS — G4730 Sleep apnea, unspecified: Secondary | ICD-10-CM | POA: Diagnosis not present

## 2024-10-28 DIAGNOSIS — Z79899 Other long term (current) drug therapy: Secondary | ICD-10-CM | POA: Insufficient documentation

## 2024-10-28 DIAGNOSIS — Z87891 Personal history of nicotine dependence: Secondary | ICD-10-CM | POA: Insufficient documentation

## 2024-10-28 SURGERY — COLONOSCOPY
Anesthesia: General

## 2024-10-28 MED ORDER — SODIUM CHLORIDE 0.9 % IV SOLN: INTRAVENOUS | Status: DC

## 2024-10-28 MED ORDER — PROPOFOL 500 MG/50ML IV EMUL
INTRAVENOUS | Status: DC | PRN
  Administered 2024-10-28: 150 ug/kg/min via INTRAVENOUS

## 2024-10-28 MED ORDER — PROPOFOL 10 MG/ML IV BOLUS
INTRAVENOUS | Status: DC | PRN
  Administered 2024-10-28: 100 mg via INTRAVENOUS

## 2024-10-28 MED ORDER — PROPOFOL 1000 MG/100ML IV EMUL
INTRAVENOUS | Status: AC
  Filled 2024-10-28: qty 100

## 2024-10-28 NOTE — Op Note (Signed)
 [facility] Gastroenterology Patient Name: Christine Michael Procedure Date: 10/28/2024 10:46 AM MRN: [id number] Account #: [id number] Date of Birth: Oct 16, 1966 Admit Type: Outpatient Age: 58 Room: [facility] ENDO ROOM 4 Gender: Female Note Status: Finalized Instrument Name: Colon Scope [id number] Procedure:             Colonoscopy Indications:           Screening for colorectal malignant neoplasm Providers:             [name] MD, MD Referring MD:          No Local Md, MD (Referring MD) Medicines:             Propofol  per Anesthesia Complications:         No immediate complications. Procedure:             Pre-Anesthesia Assessment:                        - Prior to the procedure, a History and Physical was                         performed, and patient medications and allergies were                         reviewed. The patient's tolerance of previous                         anesthesia was also reviewed. The risks and benefits                         of the procedure and the sedation options and risks                         were discussed with the patient. All questions were                         answered, and informed consent was obtained. Prior                         Anticoagulants: The patient has taken no anticoagulant                         or antiplatelet agents. ASA Grade Assessment: II - A                         patient with mild systemic disease. After reviewing                         the risks and benefits, the patient was deemed in                         satisfactory condition to undergo the procedure.                        After obtaining informed consent, the colonoscope was                         passed under direct vision. Throughout the procedure,  the patient's blood pressure, pulse, and oxygen                         saturations were monitored continuously. The                         Colonoscope was introduced through the  anus and                         advanced to the the cecum, identified by appendiceal                         orifice and ileocecal valve. The colonoscopy was                         performed without difficulty. The patient tolerated                         the procedure well. The quality of the bowel                         preparation was excellent. Findings:      The perianal and digital rectal examinations were normal.      Three sessile polyps were found in the sigmoid colon. The polyps were 1       to 2 mm in size. These polyps were removed with a cold biopsy forceps.       Resection and retrieval were complete.      Non-bleeding internal hemorrhoids were found during retroflexion. The       hemorrhoids were Grade I (internal hemorrhoids that do not prolapse). Impression:            - Three 1 to 2 mm polyps in the sigmoid colon, removed                         with a cold biopsy forceps. Resected and retrieved.                        - Non-bleeding internal hemorrhoids. Recommendation:        - Discharge patient to home.                        - Resume previous diet.                        - Continue present medications.                        - Await pathology results.                        - If the pathology report reveals adenomatous tissue,                         then repeat the colonoscopy for surveillance in 5                         years. Procedure Code(s):     --- Professional ---  54619, Colonoscopy, flexible; with biopsy, single or                         multiple Diagnosis Code(s):     --- Professional ---                        Z12.11, Encounter for screening for malignant neoplasm                         of colon                        D12.5, Benign neoplasm of sigmoid colon CPT copyright 2022 American Medical Association. All rights reserved. The codes documented in this report are preliminary and upon coder review may  be revised to meet  current compliance requirements. [name] MD, MD 10/28/2024 11:14:31 AM This report has been signed electronically. Number of Addenda: 0 Note Initiated On: 10/28/2024 10:46 AM Scope Withdrawal Time: 0 hours 7 minutes 11 seconds  Total Procedure Duration: 0 hours 9 minutes 51 seconds  Estimated Blood Loss:  Estimated blood loss: none.      [facility]

## 2024-10-28 NOTE — H&P (Signed)
 Christine Copping, MD [facility] [address] [place], [place] [address] Phone: [phone] Fax : [phone]  Primary Care Physician:  Patient, No Pcp Per Primary Gastroenterologist:  Dr. Copping  Pre-Procedure History & Physical: HPI:  Christine Michael is a 58 y.o. female is here for a screening colonoscopy.   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   Breast cancer screening by mammogram 11/13/2022   COVID-19    end 03/2021 early 04/2021   Family history of breast cancer in mother 07/29/2016   High cholesterol    Hypertension    Need for immunization against influenza 11/13/2022   Osteopenia    Sleep apnea     Past Surgical History:  Procedure Laterality Date   ABDOMINAL HYSTERECTOMY  2010   partial both ovaries out   COLONOSCOPY  2015   [facility]   SALPINGOOPHORECTOMY Bilateral 07/24/2015   Procedure: ATTEMPTED LAPAROSCOPY, ABDOMINAL BILATERAL SALPINGO OOPHORECTOMY ;  Surgeon: Christine Savers, MD;  Location: [facility] ORS;  Service: Gynecology;  Laterality: Bilateral;   TONSILLECTOMY     TUBAL LIGATION Bilateral 1988    Prior to Admission medications   Medication Sig Start Date End Date Taking? Authorizing Provider  amLODipine  (NORVASC ) 2.5 MG tablet Take 1 tablet (2.5 mg total) by mouth daily. 12/22/23  Yes Christine Merle, MD  atorvastatin  (LIPITOR) 20 MG tablet Take 1 tablet (20 mg total) by mouth at bedtime. 12/22/23  Yes Christine Merle, MD  citalopram  (CELEXA ) 20 MG tablet Take 1 tablet (20 mg total) by mouth daily. 12/22/23  Yes Christine Merle, MD  Multiple Vitamins-Minerals (WOMENS 50+ MULTI VITAMIN/MIN PO) Take by mouth.   Yes [provider]  triamterene -hydrochlorothiazide (MAXZIDE-25) 37.5-25 MG tablet Take 1 tablet by mouth daily. 12/22/23  Yes Christine Merle, MD  albuterol  (VENTOLIN  HFA) 108 (90 Base) MCG/ACT inhaler Inhale 2 puffs into the lungs every 6  (six) hours as needed for wheezing or shortness of breath. 08/11/24   Christine Verneita CROME, MD  amoxicillin -clavulanate (AUGMENTIN ) 875-125 MG tablet Take 1 tablet by mouth 2 (two) times daily. Patient not taking: Reported on 10/28/2024 08/12/24   Christine Verneita CROME, MD  doxycycline  (VIBRA -TABS) 100 MG tablet Take 1 tablet (100 mg total) by mouth 2 (two) times daily. Patient not taking: Reported on 10/28/2024 08/11/24   Christine Verneita CROME, MD  predniSONE  (DELTASONE ) 10 MG tablet 6 tablets on Day 1 , then reduce by 1 tablet daily until gone Patient not taking: Reported on 10/28/2024 08/11/24   Christine Verneita CROME, MD    Allergies as of 07/26/2024   (No Known Allergies)    Family History  Problem Relation Age of Onset   Cancer Mother    Breast cancer Mother 28       met to brain   Alcohol abuse Father    Heart failure Father    Renal Disease Sister        on HD    Social History   Socioeconomic History   Marital status: Divorced    Spouse name: Not on file   Number of children: Not on file   Years of education: Not on file   Highest education level: 12th grade  Occupational History   Not on file  Tobacco Use   Smoking status: Former    Current packs/day: 0.00    Average packs/day: 0.3 packs/day for 20.0 years (5.0 ttl  pk-yrs)    Types: Cigarettes    Start date: 07/12/1988    Quit date: 07/12/2008    Years since quitting: 16.3   Smokeless tobacco: Never  Vaping Use   Vaping status: Never Used  Substance and Sexual Activity   Alcohol use: Yes    Alcohol/week: 0.0 - 1.0 standard drinks of alcohol    Comment: very sparatic   Drug use: No   Sexual activity: Not Currently    Birth control/protection: Surgical  Other Topics Concern   Not on file  Social History Narrative   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   1 son 18 as of 10/18/21       Social Drivers of Corporate Investment Banker Strain: Low Risk  (08/11/2024)   Overall Financial Resource Strain (CARDIA)    Difficulty of Paying Living Expenses: Not  hard at all  Food Insecurity: No Food Insecurity (08/11/2024)   Hunger Vital Sign    Worried About Running Out of Food in the Last Year: Never true    Ran Out of Food in the Last Year: Never true  Transportation Needs: No Transportation Needs (08/11/2024)   PRAPARE - Administrator, Civil Service (Medical): No    Lack of Transportation (Non-Medical): No  Physical Activity: Insufficiently Active (08/11/2024)   Exercise Vital Sign    Days of Exercise per Week: 3 days    Minutes of Exercise per Session: 20 min  Stress: No Stress Concern Present (08/11/2024)   Christine Michael of Occupational Health - Occupational Stress Questionnaire    Feeling of Stress: Not at all  Social Connections: Socially Isolated (08/11/2024)   Social Connection and Isolation Panel    Frequency of Communication with Friends and Family: Twice a week    Frequency of Social Gatherings with Friends and Family: Once a week    Attends Religious Services: Patient declined    Database Administrator or Organizations: No    Attends Engineer, Structural: Not on file    Marital Status: Divorced  Catering Manager Violence: Not on file    Review of Systems: See HPI, otherwise negative ROS  Physical Exam: BP (!) 138/90   Pulse 74   Temp (!) 96.6 F (35.9 C) (Temporal)   Resp 18   Ht 5' 5 (1.651 m)   Wt 126 kg   LMP  (LMP Unknown)   SpO2 98%   BMI 46.23 kg/m  General:   Alert,  pleasant and cooperative in NAD Head:  Normocephalic and atraumatic. Neck:  Supple; no masses or thyromegaly. Lungs:  Clear throughout to auscultation.    Heart:  Regular rate and rhythm. Abdomen:  Soft, nontender and nondistended. Normal bowel sounds, without guarding, and without rebound.   Neurologic:  Alert and  oriented x4;  grossly normal neurologically.  Impression/Plan: Christine Michael is now here to undergo a screening colonoscopy.  Risks, benefits, and alternatives regarding colonoscopy have been reviewed  with the patient.  Questions have been answered.  All parties agreeable.

## 2024-10-28 NOTE — Transfer of Care (Signed)
 Immediate Anesthesia Transfer of Care Note  Patient: Christine Michael  Procedure(s) Performed: COLONOSCOPY POLYPECTOMY, INTESTINE  Patient Location: PACU  Anesthesia Type:General  Level of Consciousness: awake and sedated  Airway & Oxygen Therapy: Patient Spontanous Breathing and Patient connected to face mask oxygen  Post-op Assessment: Report given to RN and Post -op Vital signs reviewed and stable  Post vital signs: Reviewed and stable  Last Vitals:  Vitals Value Taken Time  BP    Temp    Pulse    Resp    SpO2      Last Pain:  Vitals:   10/28/24 1009  TempSrc: Temporal  PainSc: 0-No pain         Complications: There were no known notable events for this encounter.

## 2024-10-28 NOTE — Anesthesia Preprocedure Evaluation (Signed)
 Anesthesia Evaluation  Patient identified by MRN, date of birth, ID band Patient awake    Reviewed: Allergy & Precautions, H&P , NPO status , Patient's Chart, lab work & pertinent test results, reviewed documented beta blocker date and time   Airway Mallampati: II   Neck ROM: full    Dental  (+) Poor Dentition   Pulmonary sleep apnea , pneumonia, former smoker   Pulmonary exam normal        Cardiovascular Exercise Tolerance: Poor hypertension, On Medications negative cardio ROS Normal cardiovascular exam Rhythm:regular Rate:Normal     Neuro/Psych   Anxiety     negative neurological ROS  negative psych ROS   GI/Hepatic negative GI ROS, Neg liver ROS,,,  Endo/Other  negative endocrine ROS    Renal/GU negative Renal ROS  negative genitourinary   Musculoskeletal   Abdominal   Peds  Hematology negative hematology ROS (+)   Anesthesia Other Findings Past Medical History: 10/23/2022: Abscess No date: Anxiety     Comment:  panic/anxiety 12/2010 celexa  20 is helping 08/08/2015: Arthritis, senescent 11/13/2022: Breast cancer screening by mammogram No date: COVID-19     Comment:  end 03/2021 early 04/2021 07/29/2016: Family history of breast cancer in mother No date: High cholesterol No date: Hypertension 11/13/2022: Need for immunization against influenza No date: Osteopenia No date: Sleep apnea Past Surgical History: 2010: ABDOMINAL HYSTERECTOMY     Comment:  partial both ovaries out 2015: COLONOSCOPY     Comment:  [facility] 07/24/2015: SALPINGOOPHORECTOMY; Bilateral     Comment:  Procedure: ATTEMPTED LAPAROSCOPY, ABDOMINAL BILATERAL               SALPINGO OOPHORECTOMY ;  Surgeon: Archie Savers, MD;                Location: [facility] ORS;  Service: Gynecology;  Laterality:               Bilateral; No date: TONSILLECTOMY 1988: TUBAL LIGATION; Bilateral   Reproductive/Obstetrics negative OB ROS                               Anesthesia Physical Anesthesia Plan  ASA: 3  Anesthesia Plan: General   Post-op Pain Management:    Induction:   PONV Risk Score and Plan:   Airway Management Planned:   Additional Equipment:   Intra-op Plan:   Post-operative Plan:   Informed Consent: I have reviewed the patients History and Physical, chart, labs and discussed the procedure including the risks, benefits and alternatives for the proposed anesthesia with the patient or authorized representative who has indicated his/her understanding and acceptance.     Dental Advisory Given  Plan Discussed with: CRNA  Anesthesia Plan Comments:         Anesthesia Quick Evaluation

## 2024-10-29 LAB — SURGICAL PATHOLOGY

## 2024-11-01 ENCOUNTER — Ambulatory Visit: Payer: Self-pay | Admitting: Gastroenterology

## 2024-11-03 NOTE — Anesthesia Postprocedure Evaluation (Signed)
 Anesthesia Post Note  Patient: Christine Michael  Procedure(s) Performed: COLONOSCOPY POLYPECTOMY, INTESTINE  Patient location during evaluation: PACU Anesthesia Type: General Level of consciousness: awake and alert Pain management: pain level controlled Vital Signs Assessment: post-procedure vital signs reviewed and stable Respiratory status: spontaneous breathing, nonlabored ventilation, respiratory function stable and patient connected to nasal cannula oxygen Cardiovascular status: blood pressure returned to baseline and stable Postop Assessment: no apparent nausea or vomiting Anesthetic complications: no   There were no known notable events for this encounter.   Last Vitals:  Vitals:   10/28/24 1128 10/28/24 1137  BP: 126/75 (!) 138/93  Pulse: 74 68  Resp: (!) 21 16  Temp:    SpO2: 100% 100%    Last Pain:  Vitals:   10/29/24 0730  TempSrc:   PainSc: 0-No pain                 Lynwood KANDICE Clause

## 2024-11-22 ENCOUNTER — Ambulatory Visit

## 2024-11-22 VITALS — BP 120/70 | HR 79 | Temp 98.2°F | Ht 65.0 in | Wt 282.4 lb

## 2024-11-22 DIAGNOSIS — Z1231 Encounter for screening mammogram for malignant neoplasm of breast: Secondary | ICD-10-CM

## 2024-11-22 DIAGNOSIS — F39 Unspecified mood [affective] disorder: Secondary | ICD-10-CM

## 2024-11-22 DIAGNOSIS — I10 Essential (primary) hypertension: Secondary | ICD-10-CM | POA: Diagnosis not present

## 2024-11-22 DIAGNOSIS — E785 Hyperlipidemia, unspecified: Secondary | ICD-10-CM

## 2024-11-22 DIAGNOSIS — E66813 Obesity, class 3: Secondary | ICD-10-CM | POA: Diagnosis not present

## 2024-11-22 DIAGNOSIS — K219 Gastro-esophageal reflux disease without esophagitis: Secondary | ICD-10-CM | POA: Insufficient documentation

## 2024-11-22 DIAGNOSIS — E782 Mixed hyperlipidemia: Secondary | ICD-10-CM

## 2024-11-22 DIAGNOSIS — L304 Erythema intertrigo: Secondary | ICD-10-CM

## 2024-11-22 MED ORDER — ATORVASTATIN CALCIUM 20 MG PO TABS: 20.0000 mg | ORAL_TABLET | Freq: Every day | ORAL | 3 refills | Status: AC

## 2024-11-22 MED ORDER — NYSTATIN 100000 UNIT/GM EX POWD: 1.0000 | Freq: Two times a day (BID) | CUTANEOUS | 2 refills | Status: AC | PRN

## 2024-11-22 MED ORDER — CITALOPRAM HYDROBROMIDE 20 MG PO TABS: 20.0000 mg | ORAL_TABLET | Freq: Every day | ORAL | 3 refills | Status: AC

## 2024-11-22 MED ORDER — TRIAMTERENE-HCTZ 37.5-25 MG PO TABS: 1.0000 | ORAL_TABLET | Freq: Every day | ORAL | 3 refills | Status: AC

## 2024-11-22 MED ORDER — AMLODIPINE BESYLATE 2.5 MG PO TABS: 2.5000 mg | ORAL_TABLET | Freq: Every day | ORAL | 3 refills | Status: AC

## 2024-11-22 NOTE — Assessment & Plan Note (Addendum)
 Motivated for lifestyle modification, prefers no weight loss medications. Encouraged 30 minutes of moderate intensity exercise daily. Advised on dietary modifications including increased intake of lean protein, whole grains, fruits, and vegetables. Set goal for 5% weight loss. Orders:   Hemoglobin A1c   CBC with Differential/Platelet

## 2024-11-22 NOTE — Assessment & Plan Note (Addendum)
 Ordered mammogram, repeat after January 24th, 2026. Orders:   MM 3D SCREENING MAMMOGRAM BILATERAL BREAST; Future

## 2024-11-22 NOTE — Assessment & Plan Note (Addendum)
 BP on arrival elevated, repeat BP within goal. Continue current BP treatment regime. Refill sent.  Check CMP Orders:   amLODipine  (NORVASC ) 2.5 MG tablet; Take 1 tablet (2.5 mg total) by mouth daily.   triamterene -hydrochlorothiazide (MAXZIDE-25) 37.5-25 MG tablet; Take 1 tablet by mouth daily.   Comprehensive metabolic panel

## 2024-11-22 NOTE — Assessment & Plan Note (Deleted)
 SABRA

## 2024-11-22 NOTE — Assessment & Plan Note (Addendum)
 Mood well-managed with citalopram  20 mg daily, continue. No SI/HI.  Orders:   citalopram  (CELEXA ) 20 MG tablet; Take 1 tablet (20 mg total) by mouth daily.

## 2024-11-22 NOTE — Assessment & Plan Note (Addendum)
 Itching under b/l breast, under the pannus  Vaseline provides relief. Prescribed nystatin  powder for use as needed, twice a day.  Advised on use of Vaseline for moisture retention. Will consider dermatology referral if symptoms persist. Orders:   nystatin  (MYCOSTATIN /NYSTOP ) powder; Apply 1 Application topically 2 (two) times daily as needed. To rash until resolved.

## 2024-11-22 NOTE — Patient Instructions (Addendum)
 Diet: Emphasize whole grains, lean proteins, fruits, and vegetables. Limit processed foods and sugary drinks. Exercise: Aim for 150 minutes of moderate aerobic activity weekly plus strength training twice a week. Weight Loss: Target 5%   You qualify for pneumonia vaccine you can get it done at your local pharmacy.   You can apply nystatin  powder twice a day as needed under breast, belly to help with itching, reduce moisture, fungal growth. If this continues to be a problem, you may benefit from seeing a dermatologist.   Mammogram is due after 1/24/2026[facility] - call [phone]   Please schedule for cervical cancer screening or PAP with me whenever it is convenient for you.

## 2024-11-22 NOTE — Assessment & Plan Note (Addendum)
 Continue Atorvastatin  20 mg daily. Refill sent.  Repeat fasting lipid panel today.  Check CMP.  Orders:   atorvastatin  (LIPITOR) 20 MG tablet; Take 1 tablet (20 mg total) by mouth at bedtime.   Lipid panel
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
## 2024-11-22 NOTE — Assessment & Plan Note (Addendum)
 Experiences acid reflux with trigger foods, Pepcid  AC ineffective. Recommended over-the-counter PPI like Nexium one tablet prn daily.  Advised on lifestyle modifications including smaller portion sizes, avoiding trigger foods, and not lying down for at least 3 hours after eating. -Will consider endoscopy if symptoms persist.    I personally spent a total of 40 minutes in the care of the patient today including preparing to see the patient, getting/reviewing separately obtained history, performing a medically appropriate exam/evaluation, placing orders, documenting clinical information in the EHR, independently interpreting results, and communicating results.

## 2024-11-23 ENCOUNTER — Ambulatory Visit: Payer: Self-pay

## 2024-11-23 LAB — LIPID PANEL
Chol/HDL Ratio: 3.6 ratio (ref 0.0–4.4)
Cholesterol, Total: 174 mg/dL (ref 100–199)
HDL: 49 mg/dL (ref >39)
LDL Chol Calc (NIH): 111 mg/dL — ABNORMAL HIGH (ref 0–99)
Triglycerides: 74 mg/dL (ref 0–149)
VLDL Cholesterol Cal: 14 mg/dL (ref 5–40)

## 2024-11-23 LAB — CBC WITH DIFFERENTIAL/PLATELET
Basophils Absolute: 0 x10E3/uL (ref 0.0–0.2)
Basos: 1 %
EOS (ABSOLUTE): 0.2 x10E3/uL (ref 0.0–0.4)
Eos: 4 %
Hematocrit: 42 % (ref 34.0–46.6)
Hemoglobin: 13.2 g/dL (ref 11.1–15.9)
Immature Grans (Abs): 0 x10E3/uL (ref 0.0–0.1)
Immature Granulocytes: 0 %
Lymphocytes Absolute: 1.5 x10E3/uL (ref 0.7–3.1)
Lymphs: 25 %
MCH: 26.2 pg — ABNORMAL LOW (ref 26.6–33.0)
MCHC: 31.4 g/dL — ABNORMAL LOW (ref 31.5–35.7)
MCV: 84 fL (ref 79–97)
Monocytes Absolute: 0.6 x10E3/uL (ref 0.1–0.9)
Monocytes: 10 %
Neutrophils Absolute: 3.7 x10E3/uL (ref 1.4–7.0)
Neutrophils: 60 %
Platelets: 198 x10E3/uL (ref 150–450)
RBC: 5.03 x10E6/uL (ref 3.77–5.28)
RDW: 14.5 % (ref 11.7–15.4)
WBC: 6.1 x10E3/uL (ref 3.4–10.8)

## 2024-11-23 LAB — COMPREHENSIVE METABOLIC PANEL WITH GFR
ALT: 15 IU/L (ref 0–32)
AST: 20 IU/L (ref 0–40)
Albumin: 4.4 g/dL (ref 3.8–4.9)
Alkaline Phosphatase: 63 IU/L (ref 49–135)
BUN/Creatinine Ratio: 14 (ref 12–28)
BUN: 10 mg/dL (ref 8–27)
Bilirubin Total: 0.6 mg/dL (ref 0.0–1.2)
CO2: 25 mmol/L (ref 20–29)
Calcium: 9.8 mg/dL (ref 8.7–10.3)
Chloride: 104 mmol/L (ref 96–106)
Creatinine, Ser: 0.69 mg/dL (ref 0.57–1.00)
Globulin, Total: 2.3 g/dL (ref 1.5–4.5)
Glucose: 77 mg/dL (ref 70–99)
Potassium: 4 mmol/L (ref 3.5–5.2)
Sodium: 144 mmol/L (ref 134–144)
Total Protein: 6.7 g/dL (ref 6.0–8.5)
eGFR: 99 mL/min/1.73 (ref >59)

## 2024-11-23 LAB — HEMOGLOBIN A1C
Est. average glucose Bld gHb Est-mCnc: 123 mg/dL
Hgb A1c MFr Bld: 5.9 % — ABNORMAL HIGH (ref 4.8–5.6)

## 2024-11-23 NOTE — Progress Notes (Signed)
 Christine Michael

## 2024-12-21 ENCOUNTER — Ambulatory Visit: Payer: 59 | Admitting: Family Medicine

## 2025-01-18 ENCOUNTER — Ambulatory Visit
Admission: RE | Admit: 2025-01-18 | Discharge: 2025-01-18 | Disposition: A | Discharge status: Home / Self Care | Source: Ambulatory Visit

## 2025-01-18 ENCOUNTER — Encounter

## 2025-01-18 DIAGNOSIS — Z1231 Encounter for screening mammogram for malignant neoplasm of breast: Secondary | ICD-10-CM | POA: Diagnosis present

## 2025-03-01 ENCOUNTER — Ambulatory Visit

## 2025-05-23 ENCOUNTER — Ambulatory Visit
